# Patient Record
Sex: Female | Born: 1965
Health system: Southern US, Community
[De-identification: ages and names within clinical notes are randomized; demographics above are authoritative.]

## PROBLEM LIST (undated history)

## (undated) DIAGNOSIS — M2550 Pain in unspecified joint: Secondary | ICD-10-CM

## (undated) DIAGNOSIS — N83209 Unspecified ovarian cyst, unspecified side: Secondary | ICD-10-CM

## (undated) DIAGNOSIS — K5792 Diverticulitis of intestine, part unspecified, without perforation or abscess without bleeding: Secondary | ICD-10-CM

## (undated) DIAGNOSIS — I10 Essential (primary) hypertension: Secondary | ICD-10-CM

## (undated) DIAGNOSIS — M549 Dorsalgia, unspecified: Secondary | ICD-10-CM

## (undated) DIAGNOSIS — M51369 Other intervertebral disc degeneration, lumbar region without mention of lumbar back pain or lower extremity pain: Secondary | ICD-10-CM

## (undated) DIAGNOSIS — E559 Vitamin D deficiency, unspecified: Secondary | ICD-10-CM

## (undated) DIAGNOSIS — D571 Sickle-cell disease without crisis: Secondary | ICD-10-CM

## (undated) DIAGNOSIS — R519 Headache, unspecified: Secondary | ICD-10-CM

## (undated) DIAGNOSIS — R112 Nausea with vomiting, unspecified: Secondary | ICD-10-CM

## (undated) DIAGNOSIS — Z9889 Other specified postprocedural states: Secondary | ICD-10-CM

## (undated) DIAGNOSIS — M5136 Other intervertebral disc degeneration, lumbar region: Secondary | ICD-10-CM

## (undated) DIAGNOSIS — Z91018 Allergy to other foods: Secondary | ICD-10-CM

## (undated) DIAGNOSIS — M7989 Other specified soft tissue disorders: Secondary | ICD-10-CM

## (undated) HISTORY — PX: APPENDECTOMY: SHX54

## (undated) HISTORY — DX: Allergy to other foods: Z91.018

## (undated) HISTORY — DX: Vitamin D deficiency, unspecified: E55.9

## (undated) HISTORY — DX: Other intervertebral disc degeneration, lumbar region: M51.36

## (undated) HISTORY — DX: Pain in unspecified joint: M25.50

## (undated) HISTORY — DX: Other intervertebral disc degeneration, lumbar region without mention of lumbar back pain or lower extremity pain: M51.369

## (undated) HISTORY — DX: Headache, unspecified: R51.9

## (undated) HISTORY — PX: LAPAROSCOPIC OVARIAN CYSTECTOMY: SUR786

## (undated) HISTORY — PX: ABDOMINAL HYSTERECTOMY: SHX81

## (undated) HISTORY — PX: TUBAL LIGATION: SHX77

## (undated) HISTORY — DX: Other specified soft tissue disorders: M79.89

## (undated) HISTORY — DX: Dorsalgia, unspecified: M54.9

---
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
## 1993-06-25 HISTORY — PX: TUBAL LIGATION: SHX77

## 1998-04-07 ENCOUNTER — Other Ambulatory Visit: Admission: RE | Admit: 1998-04-07 | Discharge: 1998-04-07 | Payer: Self-pay | Admitting: Obstetrics and Gynecology

## 1999-08-17 ENCOUNTER — Ambulatory Visit (HOSPITAL_COMMUNITY): Admission: RE | Admit: 1999-08-17 | Discharge: 1999-08-17 | Payer: Self-pay | Admitting: Obstetrics and Gynecology

## 1999-08-17 ENCOUNTER — Encounter: Payer: Self-pay | Admitting: Obstetrics and Gynecology

## 2001-01-27 ENCOUNTER — Other Ambulatory Visit: Admission: RE | Admit: 2001-01-27 | Discharge: 2001-01-27 | Payer: Self-pay | Admitting: Obstetrics and Gynecology

## 2001-09-10 ENCOUNTER — Encounter: Payer: Self-pay | Admitting: Family Medicine

## 2001-09-10 ENCOUNTER — Ambulatory Visit (HOSPITAL_COMMUNITY): Admission: RE | Admit: 2001-09-10 | Discharge: 2001-09-10 | Payer: Self-pay | Admitting: Family Medicine

## 2001-09-22 ENCOUNTER — Ambulatory Visit (HOSPITAL_COMMUNITY): Admission: RE | Admit: 2001-09-22 | Discharge: 2001-09-22 | Payer: Self-pay | Admitting: Family Medicine

## 2001-09-22 ENCOUNTER — Encounter: Payer: Self-pay | Admitting: Family Medicine

## 2002-01-27 ENCOUNTER — Other Ambulatory Visit: Admission: RE | Admit: 2002-01-27 | Discharge: 2002-01-27 | Payer: Self-pay | Admitting: Obstetrics and Gynecology

## 2002-06-25 HISTORY — PX: LAPAROSCOPIC OVARIAN CYSTECTOMY: SUR786

## 2002-08-12 ENCOUNTER — Encounter: Admission: RE | Admit: 2002-08-12 | Discharge: 2002-08-12 | Payer: Self-pay | Admitting: Family Medicine

## 2002-08-12 ENCOUNTER — Encounter: Payer: Self-pay | Admitting: Family Medicine

## 2002-08-15 ENCOUNTER — Ambulatory Visit (HOSPITAL_COMMUNITY): Admission: RE | Admit: 2002-08-15 | Discharge: 2002-08-15 | Payer: Self-pay | Admitting: Obstetrics and Gynecology

## 2002-08-15 ENCOUNTER — Encounter (INDEPENDENT_AMBULATORY_CARE_PROVIDER_SITE_OTHER): Payer: Self-pay | Admitting: *Deleted

## 2003-03-24 ENCOUNTER — Other Ambulatory Visit: Admission: RE | Admit: 2003-03-24 | Discharge: 2003-03-24 | Payer: Self-pay | Admitting: Obstetrics and Gynecology

## 2004-04-19 ENCOUNTER — Other Ambulatory Visit: Admission: RE | Admit: 2004-04-19 | Discharge: 2004-04-19 | Payer: Self-pay | Admitting: Obstetrics and Gynecology

## 2004-05-03 ENCOUNTER — Ambulatory Visit (HOSPITAL_COMMUNITY): Admission: RE | Admit: 2004-05-03 | Discharge: 2004-05-03 | Payer: Self-pay | Admitting: Obstetrics and Gynecology

## 2005-05-30 ENCOUNTER — Other Ambulatory Visit: Admission: RE | Admit: 2005-05-30 | Discharge: 2005-05-30 | Payer: Self-pay | Admitting: Obstetrics and Gynecology

## 2005-06-27 ENCOUNTER — Ambulatory Visit (HOSPITAL_COMMUNITY): Admission: RE | Admit: 2005-06-27 | Discharge: 2005-06-27 | Payer: Self-pay | Admitting: Obstetrics and Gynecology

## 2006-04-23 ENCOUNTER — Emergency Department (HOSPITAL_COMMUNITY): Admission: EM | Admit: 2006-04-23 | Discharge: 2006-04-24 | Payer: Self-pay | Admitting: Emergency Medicine

## 2006-06-24 ENCOUNTER — Ambulatory Visit (HOSPITAL_COMMUNITY): Admission: RE | Admit: 2006-06-24 | Discharge: 2006-06-24 | Payer: Self-pay | Admitting: Family Medicine

## 2006-06-25 HISTORY — PX: ABDOMINAL HYSTERECTOMY: SHX81

## 2006-06-25 HISTORY — PX: FOOT SURGERY: SHX648

## 2006-06-28 ENCOUNTER — Ambulatory Visit (HOSPITAL_COMMUNITY): Admission: RE | Admit: 2006-06-28 | Discharge: 2006-06-28 | Payer: Self-pay | Admitting: Vascular Surgery

## 2006-07-02 ENCOUNTER — Ambulatory Visit (HOSPITAL_COMMUNITY): Admission: RE | Admit: 2006-07-02 | Discharge: 2006-07-02 | Payer: Self-pay | Admitting: Obstetrics and Gynecology

## 2007-01-09 ENCOUNTER — Encounter (INDEPENDENT_AMBULATORY_CARE_PROVIDER_SITE_OTHER): Payer: Self-pay | Admitting: Obstetrics and Gynecology

## 2007-01-09 ENCOUNTER — Ambulatory Visit (HOSPITAL_COMMUNITY): Admission: RE | Admit: 2007-01-09 | Discharge: 2007-01-10 | Payer: Self-pay | Admitting: Obstetrics and Gynecology

## 2007-07-09 ENCOUNTER — Ambulatory Visit (HOSPITAL_COMMUNITY): Admission: RE | Admit: 2007-07-09 | Discharge: 2007-07-09 | Payer: Self-pay | Admitting: Obstetrics and Gynecology

## 2007-07-16 ENCOUNTER — Encounter: Admission: RE | Admit: 2007-07-16 | Discharge: 2007-07-16 | Payer: Self-pay | Admitting: Obstetrics and Gynecology

## 2008-08-04 ENCOUNTER — Ambulatory Visit (HOSPITAL_COMMUNITY): Admission: RE | Admit: 2008-08-04 | Discharge: 2008-08-04 | Payer: Self-pay | Admitting: Obstetrics and Gynecology

## 2009-08-09 ENCOUNTER — Ambulatory Visit (HOSPITAL_COMMUNITY): Admission: RE | Admit: 2009-08-09 | Discharge: 2009-08-09 | Payer: Self-pay | Admitting: Obstetrics and Gynecology

## 2009-10-23 ENCOUNTER — Inpatient Hospital Stay (HOSPITAL_COMMUNITY): Admission: EM | Admit: 2009-10-23 | Discharge: 2009-10-26 | Payer: Self-pay

## 2009-10-23 ENCOUNTER — Encounter: Payer: Self-pay | Admitting: Emergency Medicine

## 2009-10-23 ENCOUNTER — Ambulatory Visit: Payer: Self-pay | Admitting: Diagnostic Radiology

## 2009-11-09 ENCOUNTER — Encounter: Admission: RE | Admit: 2009-11-09 | Discharge: 2009-11-09 | Payer: Self-pay | Admitting: Family Medicine

## 2010-08-08 ENCOUNTER — Other Ambulatory Visit (HOSPITAL_COMMUNITY): Payer: Self-pay | Admitting: Obstetrics and Gynecology

## 2010-08-08 DIAGNOSIS — Z1231 Encounter for screening mammogram for malignant neoplasm of breast: Secondary | ICD-10-CM

## 2010-08-22 ENCOUNTER — Encounter (HOSPITAL_COMMUNITY): Payer: Self-pay

## 2010-08-22 ENCOUNTER — Ambulatory Visit (HOSPITAL_COMMUNITY)
Admission: RE | Admit: 2010-08-22 | Discharge: 2010-08-22 | Disposition: A | Discharge status: Home / Self Care | Payer: BC Managed Care – PPO | Source: Ambulatory Visit | Attending: Obstetrics and Gynecology | Admitting: Obstetrics and Gynecology

## 2010-08-22 DIAGNOSIS — Z1231 Encounter for screening mammogram for malignant neoplasm of breast: Secondary | ICD-10-CM | POA: Insufficient documentation

## 2010-09-12 LAB — CBC
HCT: 37.3 % (ref 36.0–46.0)
HCT: 42.2 % (ref 36.0–46.0)
Hemoglobin: 12.7 g/dL (ref 12.0–15.0)
Hemoglobin: 13.9 g/dL (ref 12.0–15.0)
MCHC: 34.7 g/dL (ref 30.0–36.0)
MCV: 93.4 fL (ref 78.0–100.0)
MCV: 94 fL (ref 78.0–100.0)
MCV: 91.7 fL (ref 78.0–100.0)
Platelets: 222 10*3/uL (ref 150–400)
Platelets: 279 10*3/uL (ref 150–400)
RBC: 3.82 MIL/uL — ABNORMAL LOW (ref 3.87–5.11)
RBC: 3.97 MIL/uL (ref 3.87–5.11)
RDW: 12.2 % (ref 11.5–15.5)
RDW: 11.6 % (ref 11.5–15.5)
WBC: 10.4 10*3/uL (ref 4.0–10.5)
WBC: 7.3 10*3/uL (ref 4.0–10.5)

## 2010-09-12 LAB — COMPREHENSIVE METABOLIC PANEL
ALT: 12 U/L (ref 0–35)
AST: 13 U/L (ref 0–37)
Albumin: 3.1 g/dL — ABNORMAL LOW (ref 3.5–5.2)
CO2: 25 mEq/L (ref 19–32)
Chloride: 109 mEq/L (ref 96–112)
GFR calc Af Amer: >60 mL/min (ref >60)
GFR calc non Af Amer: >60 mL/min (ref >60)
Potassium: 3.3 mEq/L — ABNORMAL LOW (ref 3.5–5.1)
Sodium: 138 mEq/L (ref 135–145)
Total Bilirubin: 1.1 mg/dL (ref 0.3–1.2)

## 2010-09-12 LAB — BASIC METABOLIC PANEL
BUN: 11 mg/dL (ref 6–23)
CO2: 23 mEq/L (ref 19–32)
Calcium: 8.6 mg/dL (ref 8.4–10.5)
Chloride: 104 mEq/L (ref 96–112)
Creatinine, Ser: 0.97 mg/dL (ref 0.4–1.2)
GFR calc Af Amer: >60 mL/min (ref >60)
GFR calc non Af Amer: >60 mL/min (ref >60)
Glucose, Bld: 96 mg/dL (ref 70–99)
Glucose, Bld: 101 mg/dL — ABNORMAL HIGH (ref 70–99)
Potassium: 3.7 mEq/L (ref 3.5–5.1)
Sodium: 138 mEq/L (ref 135–145)
Sodium: 141 mEq/L (ref 135–145)

## 2010-09-12 LAB — CULTURE, BLOOD (ROUTINE X 2): Culture: NO GROWTH

## 2010-09-12 LAB — URINALYSIS, ROUTINE W REFLEX MICROSCOPIC
Glucose, UA: NEGATIVE mg/dL
Hgb urine dipstick: NEGATIVE
Protein, ur: NEGATIVE mg/dL
pH: 7 (ref 5.0–8.0)

## 2010-09-12 LAB — DIFFERENTIAL
Basophils Absolute: 0.3 10*3/uL — ABNORMAL HIGH (ref 0.0–0.1)
Eosinophils Relative: 0 % (ref 0–5)
Lymphs Abs: 1.6 10*3/uL (ref 0.7–4.0)
Monocytes Absolute: 0.9 10*3/uL (ref 0.1–1.0)
Neutro Abs: 14.5 10*3/uL — ABNORMAL HIGH (ref 1.7–7.7)

## 2010-11-10 NOTE — Discharge Summary (Signed)
NAME:  Deborah Chaney, STRAUSS NO.:  [id number]   MEDICAL RECORD NO.:  [id number]          PATIENT TYPE:  OIB   LOCATION:  9309                          [facility]   PHYSICIAN:  Janine Limbo, M.D.DATE OF BIRTH:  [birth year]   DATE OF ADMISSION:  01/09/2007  DATE OF DISCHARGE:  01/10/2007                               DISCHARGE SUMMARY   DISCHARGE DIAGNOSES:  1. Symptomatic uterine fibroids.  2. Menorrhagia.  3. Dysmenorrhea.  4. Pelvic adhesions.   OPERATION:  On the day of admission the patient underwent a total  vaginal hysterectomy tolerating procedure well.   HISTORY OF PRESENT ILLNESS:  The patient is a 45 year old female para 0-  1-0-1 who presents for hysterectomy because of symptomatic uterine  fibroids.  Please see the patient's dictated history and physical  examination for details.   PREOPERATIVE PHYSICAL EXAM:  VITAL SIGNS:  Weight is 195 pounds, height  is 5 feet 3-1/2 inches.  GENERAL:  Within normal limits.  PELVIC:  External genitalia is normal, vagina is normal.  Uterus is 10  weeks size and irregular.  Adnexa no masses and rectovaginal exam  confirms.   HOSPITAL COURSE:  On the day of admission the patient underwent  aforementioned procedure tolerating it well.  Postoperative course was  unremarkable with patient resuming bowel and bladder function by postop  day #1 and therefore deemed ready for discharge home.  The patient's  postoperative hemoglobin was 12.1 (preoperative hemoglobin 13.7).   DISCHARGE MEDICATIONS:  1. Vicodin 1-2 tablets every 4 hours as needed for pain.  2. Phenergan 25 mg every 6 hours as needed for nausea.   FOLLOW UP:  The patient is to call [facility] at [phone]  to schedule a 6 weeks postoperative visit.   DISCHARGE INSTRUCTIONS:  The patient was given a copy of [facility] postoperative instruction sheet.  She was further  advised to avoid driving for 1-2 weeks, heavy lifting for  4 weeks,  intercourse for 6 weeks, that she may walk up steps, that she may  shower.  She may increase her activities slowly.  The patient's diet was  without restriction.   FINAL PATHOLOGY:  Uterus and cervix, hysterectomy:  Cervix - mild  chronic inflammation, focal parakeratosis, there is no evidence of  malignancy; endometrium - secretory endometrium; there is no evidence of  malignancy; myometrium - leiomyomata, there is no evidence of  malignancy; serosa - few small adhesions, there is no evidence of  malignancy.      Deborah Chaney.      Janine Limbo, M.D.  Electronically Signed    EJP/MEDQ  D:  01/31/2007  T:  02/02/2007  Job:  [id number]

## 2010-11-10 NOTE — H&P (Signed)
NAME:  Deborah Chaney, WALLA NO.:  [id number]   MEDICAL RECORD NO.:  [id number]          PATIENT TYPE:  AMB   LOCATION:  SDC                           [facility]   PHYSICIAN:  Janine Limbo, M.D.DATE OF BIRTH:  July 19, 1965   DATE OF ADMISSION:  DATE OF DISCHARGE:                              HISTORY & PHYSICAL   DATE OF SURGERY:  January 09, 2007   HISTORY OF PRESENT ILLNESS:  Ms. Lewie Loron is a 45 year old female,  para 0-1-0-1, who presents for a vaginal hysterectomy.  The patient has  been followed at the [facility] division  of [facility].  The patient has a known history of  fibroids.  An ultrasound showed an 8.4 x 6.8-cm uterus with multiple  fibroids.  The largest fibroid measured 2.5 cm.  The patient's ovaries  appear normal.  Gonorrhea culture was negative.  Chlamydia culture was  negative.  An endometrial biopsy showed benign elements.  The patient's  Pap smear was within normal limits.  Pain medication and hormone therapy  have not relieved her discomfort.  She wants to proceed at this time  with definitive therapy.   OBSTETRICAL HISTORY:  The patient had a preterm cesarean section.   PAST MEDICAL HISTORY:  1. The patient had a bilateral tubal ligation in 1995.  2. She had an appendectomy in 1984.  3. She had her wisdom teeth removed in the early 80s.  4. In 2004, the patient had a diagnostic laparoscopy, and she was      found to have a hemorrhagic cyst of the ovary.  5. The patient had a dissecting left carotid artery in 2006, and she      was briefly treated with   Dictation ended at this point.      Janine Limbo, M.D.  Electronically Signed     AVS/MEDQ  D:  01/05/2007  T:  01/06/2007  Job:  [id number]

## 2010-11-10 NOTE — H&P (Signed)
NAME:  Deborah Chaney, Deborah Chaney                     ACCOUNT NO.:  [id number]   MEDICAL RECORD NO.:  [id number]                   PATIENT TYPE:  AMB   LOCATION:  SDC                                  [facility]   PHYSICIAN:  Janine Limbo, M.D.            DATE OF BIRTH:  [birth year]   DATE OF ADMISSION:  08/15/2002  DATE OF DISCHARGE:                                HISTORY & PHYSICAL   HISTORY OF PRESENT ILLNESS:  The patient is a 45 year old female, para 0-1-0-  1, who presents for diagnostic laparoscopy.  The patient has a three to four-  day history of right lower quadrant pain. She was evaluated by her primary  physician who obtained an ultrasound of the pelvis that showed a 5.9 x 5.0 x  4.6 cm complex mass in the right ovary.  The left ovary was within normal  limits.  The uterus was within normal limits except for a 1 cm area that was  consistent with a fibroid.  The patient has had increasing pain, and she  would like to proceed at this point with diagnostic laparoscopy.  The  patient was noted to have a normal white blood cell count as part of her  evaluation.  The patient has had a prior cesarean section as well as a prior  appendectomy.  She has had two prior laparoscopies because of ovarian cysts,  and she has had a prior laparoscopic tubal cautery.   PAST MEDICAL HISTORY:  The patient denies hypertension and diabetes.   MEDICATIONS:  Her only medication is Vicodin for pain.   SOCIAL HISTORY:  The patient denies cigarette use, alcohol use, and  recreational drug use.   DRUG ALLERGIES:  None known.   PAST OBSTETRICAL HISTORY:  The patient has had a cesarean section for a  preterm delivery.   REVIEW OF SYSTEMS:  Noncontributory.   FAMILY HISTORY:  Noncontributory.   PHYSICAL EXAMINATION:  VITAL SIGNS:  Weight is 186 pounds.  HEENT:  Within normal limits.  CHEST:  Clear.  HEART:  Regular rate and rhythm.  BREASTS:  Without masses.  ABDOMEN:  Nontender.  EXTREMITIES:  Within normal limits.  NEUROLOGIC:  Grossly normal.  PELVIC:  External genitalia is normal.  Vagina is normal.  Cervix is  nontender.  Uterus is normal size, shape, and consistency.  Adnexa:  There  is tenderness in the right adnexa, and there is a full mass present.  Rectovaginal exam confirms.   ASSESSMENT:  Right pelvic mass.    PLAN:  The patient will undergo a diagnostic laparoscopy.  She understands  the indications for her procedure, and she accepts the risk of, but not  limited to, anesthetic complications, bleeding, infection, and possible  damage to surrounding organs.  She understands that no guarantees can be  given concerning the total relief of her discomfort.  She understands that  it may be necessary to perform a oophorectomy.  Janine Limbo, M.D.    AVS/MEDQ  D:  08/14/2002  T:  08/15/2002  Job:  [id number]   cc:   Stacie Acres. White, M.D.  [address]  [place]  [place] [address]  Fax: [phone]

## 2010-11-10 NOTE — Op Note (Signed)
NAME:  Deborah Chaney, Deborah Chaney NO.:  [id number]   MEDICAL RECORD NO.:  [id number]          PATIENT TYPE:  AMB   LOCATION:  SDC                           [facility]   PHYSICIAN:  Janine Limbo, M.D.DATE OF BIRTH:  1965-09-24   DATE OF PROCEDURE:  01/09/2007  DATE OF DISCHARGE:                               OPERATIVE REPORT   PREOPERATIVE DIAGNOSIS:  1. Fibroid uterus.  2. Menorrhagia  3. Dysmenorrhea.   POSTOPERATIVE DIAGNOSIS:  1. Fibroid uterus.  2. Menorrhagia  3. Dysmenorrhea.   PROCEDURE:  Vaginal hysterectomy.   SURGEON:  Dr. Leonard Schwartz   FIRST ASSISTANT:  Dr. Dierdre Forth.   ANESTHETIC:  Was general.   DISPOSITION:  Deborah Chaney is a 45 year old female who presents  with the above-mentioned diagnosis.  She understands the indications for  her surgical procedure and she accepts the risks of, but not limited to,  anesthetic complications, bleeding, infection, and possible damage to  surrounding organs.   FINDINGS:  The patient was noted to have a multi fibroid uterus that was  approximately 10 weeks size.  The uterus weighed less than 200 grams.  The fallopian tubes and ovaries appeared normal.   PROCEDURE:  The patient was taken to the operating room where general  anesthetic was given.  The patient's abdomen, perineum, and vagina were  prepped with multiple layers of Hibiclens.  The patient was given a gram  of Ancef preoperatively.  Examination under anesthesia was performed.  A  Foley catheter was placed in the bladder.  The patient was sterilely  draped.  The cervix was injected with a diluted solution of Pitressin  and saline.  A circumferential incision was made around the cervix and  the vaginal mucosa was advanced anteriorly and posteriorly.  The  anterior cul-de-sac was sharply entered.  The posterior cul-de-sac was  sharply entered.  Alternating from right to left uterosacral ligaments,  paracervical tissues,  parametrial tissues, and uterine arteries were  clamped, cut, sutured, and tied securely.  The uterus was inverted  through the posterior colpotomy.  The remainder of the upper pedicles  were clamped and cut and the uterus was removed from the operative  field.  The upper pedicles were secured using a free tie and suture  ligature.  Hemostasis was achieved using figure-of-eight sutures.  The  sutures attached to the uterosacral ligaments were brought out through  the vaginal angles and tied securely.  A McCall culdoplasty suture was  placed in the posterior cul-de-sac incorporating the uterosacral  ligaments bilaterally and the posterior peritoneum.  A final check was  made for hemostasis and again hemostasis was confirmed.  The vaginal  cuff was then closed using figure-of-eight sutures incorporating the  anterior vaginal mucosa, the anterior peritoneum, posterior peritoneum,  and then the posterior vaginal mucosa.  The McCall culdoplasty suture  was tied securely and the apex of the vagina was noted to elevate into  the mid pelvis.  Sponge, needle, instrument counts were correct on two  occasions.  0 Vicryl suture material used throughout the procedure.  The  patient was noted to  drain clear yellow urine at end of her procedure.  The patient was given Toradol 30 mg intravenously and 30 mg  intramuscularly prior to the end of her procedure.  The patient's  anesthetic was reversed and the patient was taken to the recovery room  in stable condition.  She tolerated her procedure well.  The uterus was  sent to pathology for evaluation.      Janine Limbo, M.D.  Electronically Signed     AVS/MEDQ  D:  01/09/2007  T:  01/09/2007  Job:  [id number]

## 2010-11-10 NOTE — H&P (Signed)
NAME:  Deborah Chaney, Deborah Chaney NO.:  [id number]   MEDICAL RECORD NO.:  [id number]          PATIENT TYPE:  AMB   LOCATION:  SDC                           [facility]   PHYSICIAN:  Janine Limbo, M.D.DATE OF BIRTH:  12/22/1965   DATE OF ADMISSION:  01/09/2007  DATE OF DISCHARGE:                              HISTORY & PHYSICAL   HISTORY OF PRESENT ILLNESS:  Ms. Deborah Chaney is a 45 year old female,  para 0-1-0-1, who presents for a vaginal hysterectomy.  The patient has  been followed at the [facility].  The patient has a known history of fibroids,  menorrhagia, and dysmenorrhea.  An ultrasound showed a 8.4 x 6.8 cm  uterus with multiple fibroids.  The largest fibroid measured 2.5 cm in  size.  The fallopian tubes appeared normal.  Gonorrhea culture was  negative.  Chlamydia culture was negative.  An endometrial biopsy showed  benign elements.  Her Pap smear was within normal limits.  The patient  has had a cesarean delivery and a bilateral tubal ligation.  She had a  diagnostic laparoscopy in 2004 and she was found to have a hemorrhagic  cyst.  She had an appendectomy in 1984.  Pain medication and hormonal  therapy have not relieved her discomfort.  She is ready to proceed with  definitive therapy.   DRUG ALLERGIES:  MONISTAT causes swelling and pain.   OBSTETRICAL HISTORY:  The patient has had one preterm cesarean delivery.   PAST MEDICAL HISTORY:  The patient has a surgical history as mentioned  above.  She had her wisdom teeth removed in the early 1980s.  She had a  dissecting left carotid artery in 2007 for which she was briefly treated  with Plavix.  She is currently having no problems.   MEDICATIONS:  The patient takes multivitamins, low dose aspirin, and  Skelaxin for muscle spasms.   SOCIAL HISTORY:  The patient denies cigarette use, alcohol use, and  recreational drug use.   REVIEW OF SYSTEMS:   Please see history of present illness.   FAMILY HISTORY:  The patient's father has hypertension and diabetes.  Her paternal grandmother had colon cancer.   PHYSICAL EXAM:  VITAL SIGNS:  Weight is 195 pounds.  Height is 5 feet 3-  1/2 inch.  HEENT:  Within normal limits.  CHEST:  Clear.  HEART: Regular rate and rhythm.  BREASTS:  Without masses.  ABDOMEN:  Nontender.  EXTREMITIES:  Grossly normal.  NEUROLOGY:  Grossly normal.  PELVIC:  External genitalia is normal.  Vagina is normal.  Uterus is 10-  week size and irregular.  Adnexa no masses and rectovaginal exam  confirms.   ASSESSMENT:  1. Fibroid uterus.  2. Menorrhagia.  3. Dysmenorrhea.   PLAN:  The patient will undergo a vaginal hysterectomy.  The patient  understands the indications for her surgical procedure and she accepts  the alternatives to therapy which included observation only, myomectomy,  uterine artery embolization, endometrial ablation, fibroid ablation, and  medical therapy.  She accepts the risks, but not limited to, anesthetic  complications, bleeding, infection, and possible damage to the  surrounding organs.      Janine Limbo, M.D.  Electronically Signed     AVS/MEDQ  D:  01/05/2007  T:  01/06/2007  Job:  [id number]

## 2010-11-10 NOTE — Op Note (Signed)
NAME:  Deborah Chaney, Deborah Chaney                     ACCOUNT NO.:  [id number]   MEDICAL RECORD NO.:  [id number]                   PATIENT TYPE:  AMB   LOCATION:  SDC                                  [facility]   PHYSICIAN:  Janine Limbo, M.D.            DATE OF BIRTH:  [birth year]   DATE OF PROCEDURE:  08/15/2002  DATE OF DISCHARGE:                                 OPERATIVE REPORT   PREOPERATIVE DIAGNOSES:  1. Pelvic pain.  2. Right ovarian cyst.   POSTOPERATIVE DIAGNOSES:  1. Pelvic pain.  2. Right ovarian hemorrhagic cyst.  3. Fibroid uterus.   PROCEDURE:  1. Diagnostic laparoscopy.  2. Laparoscopic right ovarian cystectomy.   SURGEON:  Janine Limbo, M.D.   ANESTHESIA:  General.   DISPOSITION:  The patient is a 45 year old female, para 0-1-0-1, who  presents with pelvic pain.  An ultrasound showed a complex right ovarian  cyst.  The patient had her pain worsen over the past four days, and she  presents at this time for diagnostic laparoscopy.   She understands the indications for her procedure.  She accepts the risks  of, but not limited to, anesthetic complications bleeding, infections, and  possible damage to the surrounding organs.   FINDINGS:  An 8 cm hemorrhagic right ovarian cyst was present.  There were  two 2 cm fibroids present on the posterior fundus of the uterus.  The  fallopian tubes were normal, except for defects secondary to prior tubal  ligation.  The left ovary, bowel, liver, and upper abdomen were normal.  The  appendix was surgically absent.   A 50 cc hemoperitoneum was noted upon entering the abdomen.   DESCRIPTION OF PROCEDURE:  The patient was taken to the operating room,  where a general anesthetic was given.  The patient's abdomen, perineum and  vagina were prepped with multiple layers of Betadine.  A Foley catheter was  placed in the bladder.   Examination under anesthesia was performed.  A Hulka tenaculum was placed  inside  the uterus.  The patient was sterilely draped.  The subumbilical area  was injected with 6 cc of 0.5% Marcaine.  An incision was made and carried  sharply through the subcutaneous tissue, the fascia, and the anterior  peritoneum.  The Hasson cannula was sutured into place.  A pneumoperitoneum  was then obtained.  The pelvic organs were visualized, with findings as  mentioned above.  Two suprapubic incisions were made after injecting the  skin with a total of 4 cc of 0.5% Marcaine.  Two 5 mm trocars were placed in  the lower abdomen under direct visualization.  Pictures were taken of the  patient's pelvic anatomy.  The pneumoperitoneum was irrigated and then  aspirated from the pelvis.  The large hemorrhagic cyst on the right ovary  was then evacuated.  Parts of the cyst wall were peeled away and sharply  excised.  Hemostasis was achieved  using the bipolar cautery.  The pelvis was  then thoroughly irrigated.  Hemostasis was noted to be adequate throughout.   The instruments were removed under direct visualization.  The bowel was  inspected and there was no evidence of trocar damage.  The Hasson cannula  was then removed.  The fascia at the subumbilical incision was closed using  figure-of-eight sutures of 0 Vicryl.  The skin was reapproximated using a  subcuticular suture of 4-0 Vicryl.  The suprapubic incisions were closed  using 4-0 Vicryl.  Sponge, needle and instrument counts were correct.   ESTIMATED BLOOD LOSS:  60 cc (that included the 50 cc of hemoperitoneum).   DISPOSITION:  The patient was awakened from her anesthetic and taken to the  recovery room in stable condition.   FOLLOW-UP INSTRUCTIONS:  The patient was given a prescription for Darvocet N  100 and she will take one tablet q.4-6h. p.r.n. pain.  She was given a copy  of the postoperative instruction sheet, as prepared by the [facility]  of [facility] for patient's who have undergone diagnostic laparoscopy.   She  will return to see Dr. Stefano Gaul in two to three weeks for follow-up  examination.  She will call for questions or concerns.                                               Janine Limbo, M.D.    AVS/MEDQ  D:  08/15/2002  T:  08/15/2002  Job:  [id number]   cc:   Stacie Acres. White, M.D.  [address]  [place]  [place] [address]  Fax: [phone]

## 2011-04-09 LAB — CBC
HCT: 36.3
HCT: 41
Hemoglobin: 13.7
MCV: 92.7
Platelets: 236
Platelets: 269
RDW: 12.1
WBC: 21.5 — ABNORMAL HIGH
WBC: 8.8

## 2011-04-09 LAB — URINALYSIS, ROUTINE W REFLEX MICROSCOPIC
Glucose, UA: NEGATIVE
Ketones, ur: NEGATIVE
Protein, ur: NEGATIVE
Urobilinogen, UA: 0.2

## 2011-04-09 LAB — PREGNANCY, URINE: Preg Test, Ur: NEGATIVE

## 2011-04-09 LAB — COMPREHENSIVE METABOLIC PANEL
Albumin: 3.6
Alkaline Phosphatase: 56
BUN: 9
CO2: 25
Chloride: 105
Creatinine, Ser: 0.87
GFR calc non Af Amer: >60
Glucose, Bld: 96
Potassium: 3.9
Total Bilirubin: 0.8

## 2011-06-15 ENCOUNTER — Other Ambulatory Visit: Payer: Self-pay | Admitting: Family Medicine

## 2011-06-15 DIAGNOSIS — R51 Headache: Secondary | ICD-10-CM

## 2011-06-15 DIAGNOSIS — R479 Unspecified speech disturbances: Secondary | ICD-10-CM

## 2011-06-21 ENCOUNTER — Ambulatory Visit
Admission: RE | Admit: 2011-06-21 | Discharge: 2011-06-21 | Disposition: A | Discharge status: Home / Self Care | Payer: BC Managed Care – PPO | Source: Ambulatory Visit | Attending: Family Medicine | Admitting: Family Medicine

## 2011-06-21 DIAGNOSIS — R479 Unspecified speech disturbances: Secondary | ICD-10-CM

## 2011-06-21 DIAGNOSIS — R51 Headache: Secondary | ICD-10-CM

## 2011-06-21 MED ORDER — GADOBENATE DIMEGLUMINE 529 MG/ML IV SOLN
18.0000 mL | Freq: Once | INTRAVENOUS | Status: AC | PRN
  Administered 2011-06-21: 18 mL via INTRAVENOUS

## 2011-08-17 ENCOUNTER — Other Ambulatory Visit: Payer: Self-pay | Admitting: Obstetrics and Gynecology

## 2011-08-17 DIAGNOSIS — Z1231 Encounter for screening mammogram for malignant neoplasm of breast: Secondary | ICD-10-CM

## 2011-09-12 ENCOUNTER — Ambulatory Visit (HOSPITAL_COMMUNITY)
Admission: RE | Admit: 2011-09-12 | Discharge: 2011-09-12 | Disposition: A | Discharge status: Home / Self Care | Payer: BC Managed Care – PPO | Source: Ambulatory Visit | Attending: Obstetrics and Gynecology | Admitting: Obstetrics and Gynecology

## 2011-09-12 DIAGNOSIS — Z1231 Encounter for screening mammogram for malignant neoplasm of breast: Secondary | ICD-10-CM | POA: Insufficient documentation

## 2012-08-08 ENCOUNTER — Other Ambulatory Visit: Payer: Self-pay | Admitting: Obstetrics and Gynecology

## 2012-08-08 DIAGNOSIS — Z1231 Encounter for screening mammogram for malignant neoplasm of breast: Secondary | ICD-10-CM

## 2012-08-27 ENCOUNTER — Emergency Department (HOSPITAL_BASED_OUTPATIENT_CLINIC_OR_DEPARTMENT_OTHER)
Admission: EM | Admit: 2012-08-27 | Discharge: 2012-08-27 | Disposition: A | Discharge status: Home / Self Care | Payer: BC Managed Care – PPO | Attending: Emergency Medicine | Admitting: Emergency Medicine

## 2012-08-27 ENCOUNTER — Encounter (HOSPITAL_BASED_OUTPATIENT_CLINIC_OR_DEPARTMENT_OTHER): Payer: Self-pay | Admitting: Emergency Medicine

## 2012-08-27 ENCOUNTER — Emergency Department (HOSPITAL_BASED_OUTPATIENT_CLINIC_OR_DEPARTMENT_OTHER): Payer: BC Managed Care – PPO

## 2012-08-27 DIAGNOSIS — R21 Rash and other nonspecific skin eruption: Secondary | ICD-10-CM | POA: Insufficient documentation

## 2012-08-27 DIAGNOSIS — Z8742 Personal history of other diseases of the female genital tract: Secondary | ICD-10-CM | POA: Insufficient documentation

## 2012-08-27 DIAGNOSIS — K5732 Diverticulitis of large intestine without perforation or abscess without bleeding: Secondary | ICD-10-CM

## 2012-08-27 DIAGNOSIS — R112 Nausea with vomiting, unspecified: Secondary | ICD-10-CM | POA: Insufficient documentation

## 2012-08-27 HISTORY — DX: Diverticulitis of intestine, part unspecified, without perforation or abscess without bleeding: K57.92

## 2012-08-27 HISTORY — DX: Unspecified ovarian cyst, unspecified side: N83.209

## 2012-08-27 LAB — COMPREHENSIVE METABOLIC PANEL
ALT: 14 U/L (ref 0–35)
AST: 16 U/L (ref 0–37)
Albumin: 3.7 g/dL (ref 3.5–5.2)
Alkaline Phosphatase: 64 U/L (ref 39–117)
BUN: 11 mg/dL (ref 6–23)
CO2: 27 mEq/L (ref 19–32)
Calcium: 9.3 mg/dL (ref 8.4–10.5)
Chloride: 102 mEq/L (ref 96–112)
Creatinine, Ser: 1 mg/dL (ref 0.50–1.10)
GFR calc Af Amer: 76 mL/min — ABNORMAL LOW (ref >90)
GFR calc non Af Amer: 66 mL/min — ABNORMAL LOW (ref >90)
Glucose, Bld: 92 mg/dL (ref 70–99)
Potassium: 3.8 mEq/L (ref 3.5–5.1)
Sodium: 136 mEq/L (ref 135–145)
Total Bilirubin: 0.4 mg/dL (ref 0.3–1.2)
Total Protein: 7.3 g/dL (ref 6.0–8.3)

## 2012-08-27 LAB — CBC WITH DIFFERENTIAL/PLATELET
Basophils Absolute: 0 10*3/uL (ref 0.0–0.1)
Basophils Relative: 0 % (ref 0–1)
Eosinophils Absolute: 0.3 10*3/uL (ref 0.0–0.7)
Eosinophils Relative: 3 % (ref 0–5)
HCT: 38.9 % (ref 36.0–46.0)
Hemoglobin: 13.5 g/dL (ref 12.0–15.0)
Lymphocytes Relative: 12 % (ref 12–46)
Lymphs Abs: 1.4 10*3/uL (ref 0.7–4.0)
MCH: 30.8 pg (ref 26.0–34.0)
MCHC: 34.7 g/dL (ref 30.0–36.0)
MCV: 88.8 fL (ref 78.0–100.0)
Monocytes Absolute: 0.7 10*3/uL (ref 0.1–1.0)
Monocytes Relative: 6 % (ref 3–12)
Neutro Abs: 9.8 10*3/uL — ABNORMAL HIGH (ref 1.7–7.7)
Neutrophils Relative %: 80 % — ABNORMAL HIGH (ref 43–77)
Platelets: 276 10*3/uL (ref 150–400)
RBC: 4.38 MIL/uL (ref 3.87–5.11)
RDW: 11.7 % (ref 11.5–15.5)
WBC: 12.2 10*3/uL — ABNORMAL HIGH (ref 4.0–10.5)

## 2012-08-27 LAB — URINALYSIS, ROUTINE W REFLEX MICROSCOPIC
Bilirubin Urine: NEGATIVE
Ketones, ur: NEGATIVE mg/dL
Leukocytes, UA: NEGATIVE
Nitrite: NEGATIVE
Specific Gravity, Urine: 1.004 — ABNORMAL LOW (ref 1.005–1.030)
Urobilinogen, UA: 0.2 mg/dL (ref 0.0–1.0)

## 2012-08-27 MED ORDER — HYDROCODONE-ACETAMINOPHEN 5-325 MG PO TABS: 2.0000 | ORAL_TABLET | ORAL | Status: DC | PRN

## 2012-08-27 MED ORDER — IOHEXOL 300 MG/ML  SOLN
50.0000 mL | Freq: Once | INTRAMUSCULAR | Status: AC | PRN
  Administered 2012-08-27: 50 mL via ORAL

## 2012-08-27 MED ORDER — METRONIDAZOLE 500 MG PO TABS: 500.0000 mg | ORAL_TABLET | Freq: Two times a day (BID) | ORAL | Status: DC

## 2012-08-27 MED ORDER — DIPHENHYDRAMINE HCL 50 MG/ML IJ SOLN
12.5000 mg | Freq: Once | INTRAMUSCULAR | Status: AC
  Administered 2012-08-27: 12.5 mg via INTRAVENOUS
  Filled 2012-08-27: qty 1

## 2012-08-27 MED ORDER — IOHEXOL 300 MG/ML  SOLN
100.0000 mL | Freq: Once | INTRAMUSCULAR | Status: AC | PRN
  Administered 2012-08-27: 100 mL via INTRAVENOUS

## 2012-08-27 MED ORDER — ONDANSETRON HCL 4 MG PO TABS: 4.0000 mg | ORAL_TABLET | Freq: Four times a day (QID) | ORAL | Status: DC

## 2012-08-27 MED ORDER — CIPROFLOXACIN HCL 500 MG PO TABS: 500.0000 mg | ORAL_TABLET | Freq: Two times a day (BID) | ORAL | Status: DC

## 2012-08-27 NOTE — ED Provider Notes (Signed)
History     CSN: [id number]  Arrival date & time 08/27/12  1909   First MD Initiated Contact with Patient 08/27/12 2052      Chief Complaint  Patient presents with  . Flank Pain    (Consider location/radiation/quality/duration/timing/severity/associated sxs/prior treatment) Patient is a 47 y.o. female presenting with flank pain. The history is provided by the patient. No language interpreter was used.  Flank Pain This is a recurrent problem. The current episode started today. The problem occurs constantly. The problem has been waxing and waning. Associated symptoms include abdominal pain, nausea, a rash and vomiting. Pertinent negatives include no change in bowel habit, chest pain, coughing, diaphoresis, fatigue, fever or urinary symptoms. The symptoms are aggravated by eating. She has tried rest for the symptoms. The treatment provided mild relief.    Past Medical History  Diagnosis Date  . Diverticulitis   . Ovarian cyst     Past Surgical History  Procedure Laterality Date  . Appendectomy    . Tubal ligation    . Laparoscopic ovarian cystectomy    . Abdominal hysterectomy      No family history on file.  History  Substance Use Topics  . Smoking status: Never Smoker   . Smokeless tobacco: Not on file  . Alcohol Use: No    OB History   Grav Para Term Preterm Abortions TAB SAB Ect Mult Living                  Review of Systems  Constitutional: Negative for fever, diaphoresis and fatigue.  Respiratory: Negative for cough, shortness of breath and wheezing.   Cardiovascular: Negative for chest pain.  Gastrointestinal: Positive for nausea, vomiting and abdominal pain. Negative for diarrhea, blood in stool, abdominal distention and change in bowel habit.       Pain is similar to last episode of diverticulitis but "not as severe."  Genitourinary: Positive for flank pain. Negative for dysuria.  Skin: Positive for rash.       Took some Benadryl for rash at 6pm tonight.   Denies known allergies.  No new foods, soaps, lotions, or clothes.  Allergic/Immunologic: Negative.   Psychiatric/Behavioral: Negative.   All other systems reviewed and are negative.    Allergies  Penicillins  Home Medications   Current Outpatient Rx  Name  Route  Sig  Dispense  Refill  . calcium carbonate (OS-CAL - DOSED IN MG OF ELEMENTAL CALCIUM) 1250 MG tablet   Oral   Take 1 tablet by mouth daily.         . diphenhydrAMINE (BENADRYL) 25 MG tablet   Oral   Take 25 mg by mouth every 6 (six) hours as needed for itching.         . Multiple Vitamin (MULTIVITAMIN) tablet   Oral   Take 1 tablet by mouth daily.           BP 149/88  Pulse 107  Temp(Src) 97.5 F (36.4 C) (Oral)  Resp 18  Ht 5\' 4"  (1.626 m)  Wt 200 lb (90.719 kg)  BMI 34.31 kg/m2  SpO2 100%  Physical Exam  Nursing note and vitals reviewed. Constitutional: She is oriented to person, place, and time. She appears well-developed and well-nourished. No distress.  HENT:  Head: Normocephalic and atraumatic.  Eyes: EOM are normal. Pupils are equal, round, and reactive to light.  Cardiovascular: Normal rate, regular rhythm and normal heart sounds.   Pulmonary/Chest: Effort normal and breath sounds normal. No respiratory distress. She  has no wheezes.  Abdominal: Soft. Bowel sounds are normal. She exhibits no distension and no mass. There is tenderness. There is no rebound and no guarding.  Mild tenderness in LUQ and LLQ  Neurological: She is alert and oriented to person, place, and time.  Skin: Skin is warm and dry. Rash noted. She is not diaphoretic. There is erythema.  Linear erythemic, raised rash on back, stomach, arms, and thighs.     ED Course  Procedures (including critical care time)  Labs Reviewed  URINALYSIS, ROUTINE W REFLEX MICROSCOPIC - Abnormal; Notable for the following:    Specific Gravity, Urine 1.004 (*)    All other components within normal limits   No results found.   1.  Diverticulitis large intestine w/o perforation or abscess w/o bleeding       MDM  Pt with hx of diverticulitis was seen today for mild acute episode of diverticulitis.  Was prescribed antibiotics, antiemetics, and pain medicine.  Discharged in stable condition, pain mildly improved. Informed to return if pain increases, fever, or experiences blood diarrhea. Will follow up with Dr. Wynelle Link on Friday.          Junius Finner, PA-C 08/27/12 2258

## 2012-08-27 NOTE — ED Notes (Signed)
Pt returned from CT °

## 2012-08-27 NOTE — Discharge Instructions (Signed)
Diverticulitis °A diverticulum is a small pouch or sac on the colon. Diverticulosis is the presence of these diverticula on the colon. Diverticulitis is the irritation (inflammation) or infection of diverticula. °CAUSES  °The colon and its diverticula contain bacteria. If food particles block the tiny opening to a diverticulum, the bacteria inside can grow and cause an increase in pressure. This leads to infection and inflammation and is called diverticulitis. °SYMPTOMS  °· Abdominal pain and tenderness. Usually, the pain is located on the left side of your abdomen. However, it could be located elsewhere. °· Fever. °· Bloating. °· Feeling sick to your stomach (nausea). °· Throwing up (vomiting). °· Abnormal stools. °DIAGNOSIS  °Your caregiver will take a history and perform a physical exam. Since many things can cause abdominal pain, other tests may be necessary. Tests may include: °· Blood tests. °· Urine tests. °· X-ray of the abdomen. °· CT scan of the abdomen. °Sometimes, surgery is needed to determine if diverticulitis or other conditions are causing your symptoms. °TREATMENT  °Most of the time, you can be treated without surgery. Treatment includes: °· Resting the bowels by only having liquids for a few days. As you improve, you will need to eat a low-fiber diet. °· Intravenous (IV) fluids if you are losing body fluids (dehydrated). °· Antibiotic medicines that treat infections may be given. °· Pain and nausea medicine, if needed. °· Surgery if the inflamed diverticulum has burst. °HOME CARE INSTRUCTIONS  °· Try a clear liquid diet (broth, tea, or water for as long as directed by your caregiver). You may then gradually begin a low-fiber diet as tolerated. A low-fiber diet is a diet with less than 10 grams of fiber. Choose the foods below to reduce fiber in the diet: °· White breads, cereals, rice, and pasta. °· Cooked fruits and vegetables or soft fresh fruits and vegetables without the skin. °· Ground or  well-cooked tender beef, ham, veal, lamb, pork, or poultry. °· Eggs and seafood. °· After your diverticulitis symptoms have improved, your caregiver may put you on a high-fiber diet. A high-fiber diet includes 14 grams of fiber for every 1000 calories consumed. For a standard 2000 calorie diet, you would need 28 grams of fiber. Follow these diet guidelines to help you increase the fiber in your diet. It is important to slowly increase the amount fiber in your diet to avoid gas, constipation, and bloating. °· Choose whole-grain breads, cereals, pasta, and brown rice. °· Choose fresh fruits and vegetables with the skin on. Do not overcook vegetables because the more vegetables are cooked, the more fiber is lost. °· Choose more nuts, seeds, legumes, dried peas, beans, and lentils. °· Look for food products that have greater than 3 grams of fiber per serving on the Nutrition Facts label. °· Take all medicine as directed by your caregiver. °· If your caregiver has given you a follow-up appointment, it is very important that you go. Not going could result in lasting (chronic) or permanent injury, pain, and disability. If there is any problem keeping the appointment, call to reschedule. °SEEK MEDICAL CARE IF:  °· Your pain does not improve. °· You have a hard time advancing your diet beyond clear liquids. °· Your bowel movements do not return to normal. °SEEK IMMEDIATE MEDICAL CARE IF:  °· Your pain becomes worse. °· You have an oral temperature above 102° F (38.9° C), not controlled by medicine. °· You have repeated vomiting. °· You have bloody or black, tarry stools. °· Symptoms   that brought you to your caregiver become worse or are not getting better. °MAKE SURE YOU:  °· Understand these instructions. °· Will watch your condition. °· Will get help right away if you are not doing well or get worse. °Document Released: 03/21/2005 Document Revised: 09/03/2011 Document Reviewed: 07/17/2010 °[employer]® Patient Information  ©2013 [employer]. ° °

## 2012-08-27 NOTE — ED Notes (Signed)
Pt c/o left flank and left sided abd pain onset earlier today.

## 2012-08-27 NOTE — ED Notes (Signed)
Pt states she has previously had diverticulitis and this feels the same. Pt also has a fine red rash all over trunk and arms, which she took benadryl for earlier.

## 2012-08-29 NOTE — ED Provider Notes (Signed)
Medical screening examination/treatment/procedure(s) were performed by non-physician practitioner and as supervising physician I was immediately available for consultation/collaboration.   Shelda Jakes, MD 08/29/12 [phone]

## 2012-09-16 ENCOUNTER — Ambulatory Visit (HOSPITAL_COMMUNITY)
Admission: RE | Admit: 2012-09-16 | Discharge: 2012-09-16 | Disposition: A | Discharge status: Home / Self Care | Payer: BC Managed Care – PPO | Source: Ambulatory Visit | Attending: Obstetrics and Gynecology | Admitting: Obstetrics and Gynecology

## 2012-09-16 DIAGNOSIS — Z1231 Encounter for screening mammogram for malignant neoplasm of breast: Secondary | ICD-10-CM | POA: Insufficient documentation

## 2012-09-24 ENCOUNTER — Other Ambulatory Visit: Payer: Self-pay | Admitting: Obstetrics and Gynecology

## 2012-09-24 DIAGNOSIS — R928 Other abnormal and inconclusive findings on diagnostic imaging of breast: Secondary | ICD-10-CM

## 2012-10-01 ENCOUNTER — Ambulatory Visit
Admission: RE | Admit: 2012-10-01 | Discharge: 2012-10-01 | Disposition: A | Discharge status: Home / Self Care | Payer: BC Managed Care – PPO | Source: Ambulatory Visit | Attending: Obstetrics and Gynecology | Admitting: Obstetrics and Gynecology

## 2012-10-01 DIAGNOSIS — R928 Other abnormal and inconclusive findings on diagnostic imaging of breast: Secondary | ICD-10-CM

## 2013-02-03 ENCOUNTER — Other Ambulatory Visit (HOSPITAL_COMMUNITY)
Admission: RE | Admit: 2013-02-03 | Discharge: 2013-02-03 | Disposition: A | Discharge status: Home / Self Care | Payer: BC Managed Care – PPO | Source: Ambulatory Visit | Attending: Family Medicine | Admitting: Family Medicine

## 2013-02-03 ENCOUNTER — Other Ambulatory Visit: Payer: Self-pay | Admitting: Family Medicine

## 2013-02-03 DIAGNOSIS — Z01419 Encounter for gynecological examination (general) (routine) without abnormal findings: Secondary | ICD-10-CM | POA: Insufficient documentation

## 2013-06-12 ENCOUNTER — Emergency Department (HOSPITAL_BASED_OUTPATIENT_CLINIC_OR_DEPARTMENT_OTHER)
Admission: EM | Admit: 2013-06-12 | Discharge: 2013-06-12 | Disposition: A | Discharge status: Home / Self Care | Payer: BC Managed Care – PPO | Attending: Emergency Medicine | Admitting: Emergency Medicine

## 2013-06-12 ENCOUNTER — Encounter (HOSPITAL_BASED_OUTPATIENT_CLINIC_OR_DEPARTMENT_OTHER): Payer: Self-pay | Admitting: Emergency Medicine

## 2013-06-12 ENCOUNTER — Emergency Department (HOSPITAL_BASED_OUTPATIENT_CLINIC_OR_DEPARTMENT_OTHER): Payer: BC Managed Care – PPO

## 2013-06-12 DIAGNOSIS — Z880 Allergy status to penicillin: Secondary | ICD-10-CM | POA: Insufficient documentation

## 2013-06-12 DIAGNOSIS — Z79899 Other long term (current) drug therapy: Secondary | ICD-10-CM | POA: Insufficient documentation

## 2013-06-12 DIAGNOSIS — Z9089 Acquired absence of other organs: Secondary | ICD-10-CM | POA: Insufficient documentation

## 2013-06-12 DIAGNOSIS — Z792 Long term (current) use of antibiotics: Secondary | ICD-10-CM | POA: Insufficient documentation

## 2013-06-12 DIAGNOSIS — Z8719 Personal history of other diseases of the digestive system: Secondary | ICD-10-CM | POA: Insufficient documentation

## 2013-06-12 DIAGNOSIS — R51 Headache: Secondary | ICD-10-CM | POA: Insufficient documentation

## 2013-06-12 DIAGNOSIS — R519 Headache, unspecified: Secondary | ICD-10-CM

## 2013-06-12 DIAGNOSIS — Z90710 Acquired absence of both cervix and uterus: Secondary | ICD-10-CM | POA: Insufficient documentation

## 2013-06-12 DIAGNOSIS — R1110 Vomiting, unspecified: Secondary | ICD-10-CM

## 2013-06-12 DIAGNOSIS — R112 Nausea with vomiting, unspecified: Secondary | ICD-10-CM | POA: Insufficient documentation

## 2013-06-12 DIAGNOSIS — Z9851 Tubal ligation status: Secondary | ICD-10-CM | POA: Insufficient documentation

## 2013-06-12 DIAGNOSIS — Z8742 Personal history of other diseases of the female genital tract: Secondary | ICD-10-CM | POA: Insufficient documentation

## 2013-06-12 LAB — CBC WITH DIFFERENTIAL/PLATELET
Basophils Relative: 0 % (ref 0–1)
Blasts: 0 %
Eosinophils Absolute: 0.2 10*3/uL (ref 0.0–0.7)
Eosinophils Relative: 2 % (ref 0–5)
Hemoglobin: 12.2 g/dL (ref 12.0–15.0)
Lymphocytes Relative: 31 % (ref 12–46)
Monocytes Relative: 4 % (ref 3–12)
Myelocytes: 0 %
Neutro Abs: 5.5 10*3/uL (ref 1.7–7.7)
Neutrophils Relative %: 63 % (ref 43–77)
Platelets: 293 10*3/uL (ref 150–400)
RBC: 4.06 MIL/uL (ref 3.87–5.11)
WBC: 8.7 10*3/uL (ref 4.0–10.5)
nRBC: 0 /100 WBC

## 2013-06-12 LAB — CSF CELL COUNT WITH DIFFERENTIAL
Eosinophils, CSF: NONE SEEN % (ref 0–1)
RBC Count, CSF: 1 /mm3 — ABNORMAL HIGH
RBC Count, CSF: 160 /mm3 — ABNORMAL HIGH
Tube #: 1
Tube #: 4
WBC, CSF: 1 /mm3 (ref 0–5)

## 2013-06-12 LAB — BASIC METABOLIC PANEL
BUN: 8 mg/dL (ref 6–23)
CO2: 25 mEq/L (ref 19–32)
Creatinine, Ser: 1 mg/dL (ref 0.50–1.10)
GFR calc non Af Amer: 66 mL/min — ABNORMAL LOW (ref >90)
Glucose, Bld: 112 mg/dL — ABNORMAL HIGH (ref 70–99)
Potassium: 3.6 mEq/L (ref 3.5–5.1)
Sodium: 138 mEq/L (ref 135–145)

## 2013-06-12 LAB — PROTEIN, CSF: Total  Protein, CSF: 24 mg/dL (ref 15–45)

## 2013-06-12 LAB — GLUCOSE, CSF: Glucose, CSF: 68 mg/dL (ref 43–76)

## 2013-06-12 LAB — PROTIME-INR
INR: 0.99 (ref 0.00–1.49)
Prothrombin Time: 12.9 seconds (ref 11.6–15.2)

## 2013-06-12 MED ORDER — DIPHENHYDRAMINE HCL 50 MG/ML IJ SOLN
25.0000 mg | Freq: Once | INTRAMUSCULAR | Status: AC
  Administered 2013-06-12: 25 mg via INTRAVENOUS
  Filled 2013-06-12: qty 1

## 2013-06-12 MED ORDER — KETOROLAC TROMETHAMINE 30 MG/ML IJ SOLN
30.0000 mg | Freq: Once | INTRAMUSCULAR | Status: AC
  Administered 2013-06-12: 30 mg via INTRAVENOUS
  Filled 2013-06-12: qty 1

## 2013-06-12 MED ORDER — SODIUM CHLORIDE 0.9 % IV BOLUS (SEPSIS)
1000.0000 mL | Freq: Once | INTRAVENOUS | Status: AC
  Administered 2013-06-12: 1000 mL via INTRAVENOUS

## 2013-06-12 MED ORDER — PROMETHAZINE HCL 25 MG/ML IJ SOLN
25.0000 mg | Freq: Once | INTRAMUSCULAR | Status: AC
  Administered 2013-06-12: 25 mg via INTRAVENOUS
  Filled 2013-06-12: qty 1

## 2013-06-12 MED ORDER — DEXAMETHASONE SODIUM PHOSPHATE 10 MG/ML IJ SOLN
10.0000 mg | Freq: Once | INTRAMUSCULAR | Status: AC
  Administered 2013-06-12: 10 mg via INTRAVENOUS
  Filled 2013-06-12: qty 1

## 2013-06-12 MED ORDER — PROMETHAZINE HCL 25 MG PO TABS: 25.0000 mg | ORAL_TABLET | Freq: Four times a day (QID) | ORAL | Status: DC | PRN

## 2013-06-12 NOTE — ED Provider Notes (Signed)
CSN: [id number]     Arrival date & time 06/12/13  4098 History   First MD Initiated Contact with Patient 06/12/13 0830     Chief Complaint  Patient presents with  . Headache  . Nausea  . Emesis   (Consider location/radiation/quality/duration/timing/severity/associated sxs/prior Treatment) HPI Comments: Patient presents to the for evaluation of headache. This represents a headache one week ago that resolved with over-the-counter medicines. She had a second headache Sarina Ser primary care doctor, was prescribed hydrocodone. She has had a headache which is worse than the other 2. Headache is global, pounding, across the top of her head. She has had nausea and vomiting. Patient reports that this is the worst headache she has ever had.   Past Medical History  Diagnosis Date  . Diverticulitis   . Ovarian cyst    Past Surgical History  Procedure Laterality Date  . Appendectomy    . Tubal ligation    . Laparoscopic ovarian cystectomy    . Abdominal hysterectomy     No family history on file. History  Substance Use Topics  . Smoking status: Never Smoker   . Smokeless tobacco: Not on file  . Alcohol Use: No   OB History   Grav Para Term Preterm Abortions TAB SAB Ect Mult Living                 Review of Systems  Constitutional: Negative for fever.  Gastrointestinal: Positive for nausea and vomiting.  Neurological: Positive for headaches.  All other systems reviewed and are negative.    Allergies  Penicillins  Home Medications   Current Outpatient Rx  Name  Route  Sig  Dispense  Refill  . HYDROcodone-acetaminophen (NORCO/VICODIN) 5-325 MG per tablet   Oral   Take 2 tablets by mouth every 4 (four) hours as needed for pain.   20 tablet   0   . promethazine (PHENERGAN) 25 MG tablet   Oral   Take 25 mg by mouth every 6 (six) hours as needed for nausea or vomiting.         . calcium carbonate (OS-CAL - DOSED IN MG OF ELEMENTAL CALCIUM) 1250 MG tablet   Oral   Take  1 tablet by mouth daily.         . ciprofloxacin (CIPRO) 500 MG tablet   Oral   Take 1 tablet (500 mg total) by mouth 2 (two) times daily.   20 tablet   0   . diphenhydrAMINE (BENADRYL) 25 MG tablet   Oral   Take 25 mg by mouth every 6 (six) hours as needed for itching.         . metroNIDAZOLE (FLAGYL) 500 MG tablet   Oral   Take 1 tablet (500 mg total) by mouth 2 (two) times daily.   20 tablet   0   . Multiple Vitamin (MULTIVITAMIN) tablet   Oral   Take 1 tablet by mouth daily.         . ondansetron (ZOFRAN) 4 MG tablet   Oral   Take 1 tablet (4 mg total) by mouth every 6 (six) hours.   20 tablet   0    BP 126/78  Pulse 92  Temp(Src) 98.4 F (36.9 C) (Oral)  Resp 18  Ht 5\' 4"  (1.626 m)  Wt 200 lb (90.719 kg)  BMI 34.31 kg/m2  SpO2 100% Physical Exam  Constitutional: She is oriented to person, place, and time. She appears well-developed and well-nourished. No distress.  HENT:  Head: Normocephalic and atraumatic.  Right Ear: Hearing normal.  Left Ear: Hearing normal.  Nose: Nose normal.  Mouth/Throat: Oropharynx is clear and moist and mucous membranes are normal.  Eyes: Conjunctivae and EOM are normal. Pupils are equal, round, and reactive to light.  Neck: Normal range of motion. Neck supple.  Cardiovascular: Regular rhythm, S1 normal and S2 normal.  Exam reveals no gallop and no friction rub.   No murmur heard. Pulmonary/Chest: Effort normal and breath sounds normal. No respiratory distress. She exhibits no tenderness.  Abdominal: Soft. Normal appearance and bowel sounds are normal. There is no hepatosplenomegaly. There is no tenderness. There is no rebound, no guarding, no tenderness at McBurney's point and negative Murphy's sign. No hernia.  Musculoskeletal: Normal range of motion.  Neurological: She is alert and oriented to person, place, and time. She has normal strength. No cranial nerve deficit or sensory deficit. Coordination normal. GCS eye subscore  is 4. GCS verbal subscore is 5. GCS motor subscore is 6.  Skin: Skin is warm, dry and intact. No rash noted. No cyanosis.  Psychiatric: She has a normal mood and affect. Her speech is normal and behavior is normal. Thought content normal.    ED Course  LUMBAR PUNCTURE Date/Time: 06/12/2013 3:04 PM Performed by: Gilda Crease. Authorized by: Gilda Crease Consent: Verbal consent obtained. written consent obtained. Risks and benefits: risks, benefits and alternatives were discussed Consent given by: patient Patient understanding: patient states understanding of the procedure being performed Patient consent: the patient's understanding of the procedure matches consent given Procedure consent: procedure consent matches procedure scheduled Relevant documents: relevant documents present and verified Test results: test results available and properly labeled Site marked: the operative site was marked Imaging studies: imaging studies available Patient identity confirmed: verbally with patient and arm band Time out: Immediately prior to procedure a "time out" was called to verify the correct patient, procedure, equipment, support staff and site/side marked as required. Indications: evaluation for infection and evaluation for subarachnoid hemorrhage Anesthesia: local infiltration Local anesthetic: lidocaine 1% without epinephrine Anesthetic total: 5 ml Patient sedated: no Preparation: Patient was prepped and draped in the usual sterile fashion. Lumbar space: L4-L5 interspace Patient's position: sitting Needle gauge: 18 Needle type: spinal needle - Quincke tip Needle length: 3.5 in Number of attempts: 1 Opening pressure: 36 cm H2O Closing pressure: 34.5 cm H2O Fluid appearance: clear Tubes of fluid: 4 Total volume: 4 ml Post-procedure: site cleaned, pressure dressing applied and adhesive bandage applied Patient tolerance: Patient tolerated the procedure well with no  immediate complications.   (including critical care time) Labs Review Labs Reviewed  BASIC METABOLIC PANEL - Abnormal; Notable for the following:    Glucose, Bld 112 (*)    GFR calc non Af Amer 66 (*)    GFR calc Af Amer 76 (*)    All other components within normal limits  CSF CELL COUNT WITH DIFFERENTIAL - Abnormal; Notable for the following:    RBC Count, CSF 160 (*)    All other components within normal limits  CSF CELL COUNT WITH DIFFERENTIAL - Abnormal; Notable for the following:    RBC Count, CSF 1 (*)    All other components within normal limits  CSF CULTURE  CBC WITH DIFFERENTIAL  PROTIME-INR  GLUCOSE, CSF  PROTEIN, CSF   Imaging Review Ct Head Wo Contrast  06/12/2013   CLINICAL DATA:  Intermittent headaches which awaken patient from sleep  EXAM: CT HEAD WITHOUT CONTRAST  TECHNIQUE: Contiguous axial  images were obtained from the base of the skull through the vertex without intravenous contrast.  COMPARISON:  Brain MRI June 21, 2011  FINDINGS: Ventricles are normal in size and configuration. There is no mass, hemorrhage, extra-axial fluid collection, or midline shift. The gray-white compartments are normal. There is no demonstrable acute infarct. Bony calvarium appears intact. The mastoid air cells are clear.  IMPRESSION: Study within normal limits.   Electronically Signed   By: Bretta Bang M.D.   On: 06/12/2013 09:37    EKG Interpretation   None       MDM   1. Headache   2. Vomiting    Patient presents to ER for evaluation of headache. Patient has had 3 separate headaches, he twists in the last. She reports that her headache today is the worst headache she has ever had. Patient does report some stiffness of her neck. She does not have frank meningismus. There is no fever. The patient had significant improvement with IV fluids, Decadron, Reglan, Toradol, Benadryl. This is migraine headache treatment, no analgesics were provided other than Toradol. It is  reassuring that she had significant improvement.  As this was the worse headache she has ever had, CT scan was performed. It was negative. This was very reassuring. I did recommend lumbar puncture to rule out subarachnoid hemorrhage in infection. After discussing risks and benefits, patient to consent. CSF studies are entirely unremarkable. Patient is much improved and therefore was discharged home to followup as needed. Return if her symptoms worsen.    Gilda Crease, MD 06/12/13 [phone]

## 2013-06-12 NOTE — ED Notes (Signed)
Supplies placed at bedside for md. 

## 2013-06-12 NOTE — ED Notes (Signed)
Consent for LP signed by pt.

## 2013-06-12 NOTE — ED Notes (Signed)
Pt states having intermittent headache that was waking her up at night. Saw PCP Tuesday and prescribed vicodin and phenergan without relief.

## 2013-06-12 NOTE — ED Notes (Signed)
md at bedside performing LP with staff assist. Pt instructed that she must lay flat for a minimum of one hour post procedure. Pt verbalizes understanding.

## 2013-06-15 LAB — CSF CULTURE W GRAM STAIN: Culture: NO GROWTH

## 2013-09-04 ENCOUNTER — Other Ambulatory Visit: Payer: Self-pay | Admitting: Obstetrics and Gynecology

## 2013-09-04 DIAGNOSIS — Z1231 Encounter for screening mammogram for malignant neoplasm of breast: Secondary | ICD-10-CM

## 2013-09-29 ENCOUNTER — Ambulatory Visit: Payer: Self-pay | Admitting: Podiatry

## 2013-10-01 ENCOUNTER — Encounter: Payer: Self-pay | Admitting: Podiatry

## 2013-10-01 ENCOUNTER — Ambulatory Visit (INDEPENDENT_AMBULATORY_CARE_PROVIDER_SITE_OTHER): Payer: BC Managed Care – PPO | Admitting: Podiatry

## 2013-10-01 ENCOUNTER — Ambulatory Visit (INDEPENDENT_AMBULATORY_CARE_PROVIDER_SITE_OTHER): Payer: BC Managed Care – PPO

## 2013-10-01 VITALS — BP 124/84 | HR 71 | Resp 16

## 2013-10-01 DIAGNOSIS — M7660 Achilles tendinitis, unspecified leg: Secondary | ICD-10-CM

## 2013-10-01 DIAGNOSIS — M7661 Achilles tendinitis, right leg: Secondary | ICD-10-CM

## 2013-10-01 DIAGNOSIS — M722 Plantar fascial fibromatosis: Secondary | ICD-10-CM

## 2013-10-01 MED ORDER — MELOXICAM 15 MG PO TABS: 15.0000 mg | ORAL_TABLET | Freq: Every day | ORAL | Status: DC

## 2013-10-01 MED ORDER — METHYLPREDNISOLONE (PAK) 4 MG PO TABS: ORAL_TABLET | ORAL | Status: DC

## 2013-10-01 NOTE — Progress Notes (Signed)
The  Left heel is hurting and the right achilles is starting to act up.  Objective: Vital signs are stable she is alert and oriented x3. Pulses are palpable bilateral. She has pain on palpation medial calcaneal tubercle of the left heel minimally so on the right heel.  Assessment: Plantar fasciitis left greater than right.  Plan: Injected the left heel today with Kenalog and local anesthetic bilateral plantar fascial brace is were dispensed. She will continue conservative therapies at home with night splints and anti-inflammatories. She was also scanned for orthotics while she was here today.

## 2013-10-06 ENCOUNTER — Ambulatory Visit (HOSPITAL_COMMUNITY)
Admission: RE | Admit: 2013-10-06 | Discharge: 2013-10-06 | Disposition: A | Discharge status: Home / Self Care | Payer: BC Managed Care – PPO | Source: Ambulatory Visit | Attending: Obstetrics and Gynecology | Admitting: Obstetrics and Gynecology

## 2013-10-06 DIAGNOSIS — Z1231 Encounter for screening mammogram for malignant neoplasm of breast: Secondary | ICD-10-CM | POA: Insufficient documentation

## 2013-10-14 ENCOUNTER — Ambulatory Visit (HOSPITAL_COMMUNITY)
Admission: RE | Admit: 2013-10-14 | Discharge: 2013-10-14 | Disposition: A | Discharge status: Home / Self Care | Payer: BC Managed Care – PPO | Source: Ambulatory Visit | Attending: Family Medicine | Admitting: Family Medicine

## 2013-10-14 ENCOUNTER — Other Ambulatory Visit (HOSPITAL_COMMUNITY): Payer: Self-pay | Admitting: Family Medicine

## 2013-10-14 DIAGNOSIS — R200 Anesthesia of skin: Secondary | ICD-10-CM

## 2013-10-14 DIAGNOSIS — R209 Unspecified disturbances of skin sensation: Secondary | ICD-10-CM | POA: Insufficient documentation

## 2013-10-14 DIAGNOSIS — H547 Unspecified visual loss: Secondary | ICD-10-CM

## 2013-10-14 DIAGNOSIS — H538 Other visual disturbances: Secondary | ICD-10-CM | POA: Insufficient documentation

## 2013-10-27 ENCOUNTER — Ambulatory Visit: Payer: BC Managed Care – PPO | Admitting: Podiatry

## 2013-11-12 ENCOUNTER — Ambulatory Visit: Payer: BC Managed Care – PPO | Admitting: Podiatry

## 2013-11-17 ENCOUNTER — Encounter: Payer: Self-pay | Admitting: Podiatry

## 2013-11-17 ENCOUNTER — Ambulatory Visit (INDEPENDENT_AMBULATORY_CARE_PROVIDER_SITE_OTHER): Payer: BC Managed Care – PPO | Admitting: Podiatry

## 2013-11-17 VITALS — BP 122/82 | HR 70 | Resp 16

## 2013-11-17 DIAGNOSIS — M722 Plantar fascial fibromatosis: Secondary | ICD-10-CM

## 2013-11-17 NOTE — Patient Instructions (Signed)

## 2013-11-17 NOTE — Progress Notes (Signed)
She presents today with an improvement to her heel pain. At this point she states it is considerably better.  Objective: Pulses are palpable positive heel pain on palpation medial calcaneal tubercle.  Plantar fasciitis.  Plan: Reinjected the heel today continue all conservative therapies I will followup with her in one month at which time we will reevaluate heel pain and evaluate the orthotics which were dispensed today she was given both oral and written home-going instructions for the use and care of his orthotics.

## 2013-12-22 ENCOUNTER — Ambulatory Visit: Payer: BC Managed Care – PPO | Admitting: Podiatry

## 2014-02-04 ENCOUNTER — Encounter (HOSPITAL_BASED_OUTPATIENT_CLINIC_OR_DEPARTMENT_OTHER): Payer: Self-pay | Admitting: Emergency Medicine

## 2014-02-04 ENCOUNTER — Emergency Department (HOSPITAL_BASED_OUTPATIENT_CLINIC_OR_DEPARTMENT_OTHER)
Admission: EM | Admit: 2014-02-04 | Discharge: 2014-02-04 | Disposition: A | Discharge status: Home / Self Care | Payer: BC Managed Care – PPO | Attending: Emergency Medicine | Admitting: Emergency Medicine

## 2014-02-04 ENCOUNTER — Emergency Department (HOSPITAL_BASED_OUTPATIENT_CLINIC_OR_DEPARTMENT_OTHER): Payer: BC Managed Care – PPO

## 2014-02-04 DIAGNOSIS — Z79899 Other long term (current) drug therapy: Secondary | ICD-10-CM | POA: Diagnosis not present

## 2014-02-04 DIAGNOSIS — K5900 Constipation, unspecified: Secondary | ICD-10-CM | POA: Insufficient documentation

## 2014-02-04 DIAGNOSIS — Z880 Allergy status to penicillin: Secondary | ICD-10-CM | POA: Insufficient documentation

## 2014-02-04 DIAGNOSIS — N83209 Unspecified ovarian cyst, unspecified side: Secondary | ICD-10-CM | POA: Diagnosis not present

## 2014-02-04 DIAGNOSIS — K5732 Diverticulitis of large intestine without perforation or abscess without bleeding: Secondary | ICD-10-CM | POA: Diagnosis not present

## 2014-02-04 DIAGNOSIS — R350 Frequency of micturition: Secondary | ICD-10-CM | POA: Insufficient documentation

## 2014-02-04 DIAGNOSIS — N83202 Unspecified ovarian cyst, left side: Secondary | ICD-10-CM

## 2014-02-04 DIAGNOSIS — R109 Unspecified abdominal pain: Secondary | ICD-10-CM | POA: Insufficient documentation

## 2014-02-04 LAB — URINALYSIS, ROUTINE W REFLEX MICROSCOPIC
Bilirubin Urine: NEGATIVE
GLUCOSE, UA: NEGATIVE mg/dL
Ketones, ur: NEGATIVE mg/dL
LEUKOCYTES UA: NEGATIVE
Nitrite: NEGATIVE
PH: 5 (ref 5.0–8.0)
Protein, ur: NEGATIVE mg/dL
SPECIFIC GRAVITY, URINE: 1.008 (ref 1.005–1.030)
Urobilinogen, UA: 0.2 mg/dL (ref 0.0–1.0)

## 2014-02-04 LAB — CBC WITH DIFFERENTIAL/PLATELET
BASOS PCT: 0 % (ref 0–1)
Basophils Absolute: 0 10*3/uL (ref 0.0–0.1)
EOS ABS: 0.1 10*3/uL (ref 0.0–0.7)
Eosinophils Relative: 1 % (ref 0–5)
HCT: 41.7 % (ref 36.0–46.0)
Hemoglobin: 14.3 g/dL (ref 12.0–15.0)
Lymphocytes Relative: 8 % — ABNORMAL LOW (ref 12–46)
Lymphs Abs: 1.1 10*3/uL (ref 0.7–4.0)
MCH: 30.3 pg (ref 26.0–34.0)
MCHC: 34.3 g/dL (ref 30.0–36.0)
MCV: 88.3 fL (ref 78.0–100.0)
MONOS PCT: 5 % (ref 3–12)
Monocytes Absolute: 0.7 10*3/uL (ref 0.1–1.0)
Neutro Abs: 12.3 10*3/uL — ABNORMAL HIGH (ref 1.7–7.7)
Neutrophils Relative %: 86 % — ABNORMAL HIGH (ref 43–77)
PLATELETS: 309 10*3/uL (ref 150–400)
RBC: 4.72 MIL/uL (ref 3.87–5.11)
RDW: 12.1 % (ref 11.5–15.5)
WBC: 14.2 10*3/uL — ABNORMAL HIGH (ref 4.0–10.5)

## 2014-02-04 LAB — BASIC METABOLIC PANEL
Anion gap: 11 (ref 5–15)
BUN: 10 mg/dL (ref 6–23)
CALCIUM: 9.8 mg/dL (ref 8.4–10.5)
CO2: 25 mEq/L (ref 19–32)
Chloride: 104 mEq/L (ref 96–112)
Creatinine, Ser: 1 mg/dL (ref 0.50–1.10)
GFR, EST AFRICAN AMERICAN: 75 mL/min — AB (ref >90)
GFR, EST NON AFRICAN AMERICAN: 65 mL/min — AB (ref >90)
Glucose, Bld: 122 mg/dL — ABNORMAL HIGH (ref 70–99)
Potassium: 4.2 mEq/L (ref 3.7–5.3)
SODIUM: 140 meq/L (ref 137–147)

## 2014-02-04 LAB — URINE MICROSCOPIC-ADD ON

## 2014-02-04 MED ORDER — SODIUM CHLORIDE 0.9 % IV BOLUS (SEPSIS)
1000.0000 mL | Freq: Once | INTRAVENOUS | Status: AC
  Administered 2014-02-04: 1000 mL via INTRAVENOUS

## 2014-02-04 MED ORDER — ONDANSETRON 8 MG PO TBDP
ORAL_TABLET | ORAL | Status: DC
Start: 2014-02-04 — End: 2014-02-04
  Filled 2014-02-04: qty 1

## 2014-02-04 MED ORDER — DOCUSATE SODIUM 100 MG PO CAPS
100.0000 mg | ORAL_CAPSULE | Freq: Once | ORAL | Status: AC
  Administered 2014-02-04: 100 mg via ORAL
  Filled 2014-02-04: qty 1

## 2014-02-04 MED ORDER — ONDANSETRON 8 MG PO TBDP
8.0000 mg | ORAL_TABLET | Freq: Once | ORAL | Status: AC
  Administered 2014-02-04: 8 mg via ORAL

## 2014-02-04 MED ORDER — CIPROFLOXACIN HCL 500 MG PO TABS: 500.0000 mg | ORAL_TABLET | Freq: Two times a day (BID) | ORAL | Status: DC

## 2014-02-04 MED ORDER — HYDROCODONE-ACETAMINOPHEN 5-325 MG PO TABS: 1.0000 | ORAL_TABLET | Freq: Four times a day (QID) | ORAL | Status: DC | PRN

## 2014-02-04 MED ORDER — MORPHINE SULFATE 4 MG/ML IJ SOLN
4.0000 mg | Freq: Once | INTRAMUSCULAR | Status: AC
  Administered 2014-02-04: 4 mg via INTRAVENOUS
  Filled 2014-02-04: qty 1

## 2014-02-04 MED ORDER — ONDANSETRON HCL 4 MG/2ML IJ SOLN
4.0000 mg | Freq: Once | INTRAMUSCULAR | Status: AC
  Administered 2014-02-04: 4 mg via INTRAVENOUS
  Filled 2014-02-04: qty 2

## 2014-02-04 MED ORDER — KETOROLAC TROMETHAMINE 30 MG/ML IJ SOLN
30.0000 mg | Freq: Once | INTRAMUSCULAR | Status: AC
  Administered 2014-02-04: 30 mg via INTRAVENOUS
  Filled 2014-02-04: qty 1

## 2014-02-04 MED ORDER — METRONIDAZOLE 500 MG PO TABS: 500.0000 mg | ORAL_TABLET | Freq: Two times a day (BID) | ORAL | Status: DC

## 2014-02-04 MED ORDER — DICYCLOMINE HCL 10 MG/ML IM SOLN
20.0000 mg | Freq: Once | INTRAMUSCULAR | Status: AC
  Administered 2014-02-04: 20 mg via INTRAMUSCULAR
  Filled 2014-02-04: qty 2

## 2014-02-04 MED ORDER — METRONIDAZOLE 500 MG PO TABS
500.0000 mg | ORAL_TABLET | Freq: Once | ORAL | Status: AC
  Administered 2014-02-04: 500 mg via ORAL
  Filled 2014-02-04: qty 1

## 2014-02-04 MED ORDER — CIPROFLOXACIN HCL 500 MG PO TABS
500.0000 mg | ORAL_TABLET | Freq: Once | ORAL | Status: AC
  Administered 2014-02-04: 500 mg via ORAL
  Filled 2014-02-04: qty 1

## 2014-02-04 MED ORDER — ONDANSETRON 8 MG PO TBDP: ORAL_TABLET | ORAL | Status: DC

## 2014-02-04 NOTE — ED Notes (Signed)
Patient transported to CT 

## 2014-02-04 NOTE — ED Notes (Signed)
Patient preparing for discharge. 

## 2014-02-04 NOTE — ED Notes (Signed)
abd pain all week with vomiting. Pt states that she thought it was constipation but no OTC meds have helped.

## 2014-02-04 NOTE — ED Notes (Signed)
Pt assisted to car by Alvis Lemmingsawn, EMT. Husband is driving her home.

## 2014-02-04 NOTE — ED Provider Notes (Signed)
CSN: [id number]     Arrival date & time 02/04/14  0404 History   First MD Initiated Contact with Patient 02/04/14 [id number]     Chief Complaint  Patient presents with  . Abdominal Pain     (Consider location/radiation/quality/duration/timing/severity/associated sxs/prior Treatment) Patient is a 47 y.o. female presenting with abdominal pain. The history is provided by the patient.  Abdominal Pain Pain location:  L flank, LLQ and suprapubic Pain quality: cramping   Pain radiates to:  Does not radiate Pain severity:  Severe Onset quality:  Gradual Timing:  Constant Progression:  Worsening Chronicity:  New Context: not laxative use and not trauma   Relieved by:  Nothing Worsened by:  Nothing tried Ineffective treatments: alka seltzer. Associated symptoms: constipation, nausea and vomiting   Associated symptoms: no anorexia, no diarrhea and no dysuria   Associated symptoms comment:  Frequency Risk factors: no recent hospitalization     Past Medical History  Diagnosis Date  . Diverticulitis   . Ovarian cyst    Past Surgical History  Procedure Laterality Date  . Appendectomy    . Tubal ligation    . Laparoscopic ovarian cystectomy    . Abdominal hysterectomy     History reviewed. No pertinent family history. History  Substance Use Topics  . Smoking status: Never Smoker   . Smokeless tobacco: Never Used  . Alcohol Use: No   OB History   Grav Para Term Preterm Abortions TAB SAB Ect Mult Living                 Review of Systems  Gastrointestinal: Positive for nausea, vomiting, abdominal pain and constipation. Negative for diarrhea and anorexia.  Genitourinary: Positive for frequency. Negative for dysuria.  All other systems reviewed and are negative.     Allergies  Penicillins  Home Medications   Prior to Admission medications   Medication Sig Start Date End Date Taking? Authorizing Provider  calcium carbonate (OS-CAL - DOSED IN MG OF ELEMENTAL CALCIUM) 1250 MG  tablet Take 1 tablet by mouth daily.    Historical Provider, MD  diphenhydrAMINE (BENADRYL) 25 MG tablet Take 25 mg by mouth every 6 (six) hours as needed for itching.    Historical Provider, MD  meloxicam (MOBIC) 15 MG tablet Take 1 tablet (15 mg total) by mouth daily. 10/01/13   Max T Hyatt, DPM  methylPREDNIsolone (MEDROL DOSPACK) 4 MG tablet follow package directions 10/01/13   Max T Al Corpus, DPM  Multiple Vitamin (MULTIVITAMIN) tablet Take 1 tablet by mouth daily.    Historical Provider, MD   BP 139/99  Pulse 116  Temp(Src) 99.4 F (37.4 C) (Oral)  Resp 18  Ht 5\' 4"  (1.626 m)  Wt 218 lb (98.884 kg)  BMI 37.40 kg/m2  SpO2 100% Physical Exam  Constitutional: She is oriented to person, place, and time. She appears well-developed and well-nourished. No distress.  HENT:  Head: Normocephalic and atraumatic.  Mouth/Throat: Oropharynx is clear and moist.  Eyes: Conjunctivae are normal. Pupils are equal, round, and reactive to light.  Neck: Normal range of motion. Neck supple.  Cardiovascular: Normal rate, regular rhythm and intact distal pulses.   Pulmonary/Chest: Effort normal and breath sounds normal. She has no wheezes. She has no rales.  Abdominal: Soft. Bowel sounds are increased. There is tenderness in the suprapubic area. There is no rigidity, no rebound, no guarding, no tenderness at McBurney's point and negative Murphy's sign.  Palpable stool throughout colon  Musculoskeletal: Normal range of motion.  Neurological: She  is alert and oriented to person, place, and time.  Skin: Skin is warm and dry.  Psychiatric: She has a normal mood and affect.    ED Course  Procedures (including critical care time) Labs Review Labs Reviewed  URINALYSIS, ROUTINE W REFLEX MICROSCOPIC - Abnormal; Notable for the following:    Hgb urine dipstick TRACE (*)    All other components within normal limits  URINE MICROSCOPIC-ADD ON  CBC WITH DIFFERENTIAL  BASIC METABOLIC PANEL    Imaging Review No  results found.   EKG Interpretation None     Results for orders placed during the hospital encounter of 02/04/14  URINALYSIS, ROUTINE W REFLEX MICROSCOPIC      Result Value Ref Range   Color, Urine YELLOW  YELLOW   APPearance CLEAR  CLEAR   Specific Gravity, Urine 1.008  1.005 - 1.030   pH 5.0  5.0 - 8.0   Glucose, UA NEGATIVE  NEGATIVE mg/dL   Hgb urine dipstick TRACE (*) NEGATIVE   Bilirubin Urine NEGATIVE  NEGATIVE   Ketones, ur NEGATIVE  NEGATIVE mg/dL   Protein, ur NEGATIVE  NEGATIVE mg/dL   Urobilinogen, UA 0.2  0.0 - 1.0 mg/dL   Nitrite NEGATIVE  NEGATIVE   Leukocytes, UA NEGATIVE  NEGATIVE  URINE MICROSCOPIC-ADD ON      Result Value Ref Range   Squamous Epithelial / LPF RARE  RARE   WBC, UA 0-2  <3 WBC/hpf   RBC / HPF 0-2  <3 RBC/hpf   Bacteria, UA RARE  RARE  CBC WITH DIFFERENTIAL      Result Value Ref Range   WBC 14.2 (*) 4.0 - 10.5 K/uL   RBC 4.72  3.87 - 5.11 MIL/uL   Hemoglobin 14.3  12.0 - 15.0 g/dL   HCT 16.1  09.6 - 04.5 %   MCV 88.3  78.0 - 100.0 fL   MCH 30.3  26.0 - 34.0 pg   MCHC 34.3  30.0 - 36.0 g/dL   RDW 40.9  81.1 - 91.4 %   Platelets 309  150 - 400 K/uL   Neutrophils Relative % 86 (*) 43 - 77 %   Neutro Abs 12.3 (*) 1.7 - 7.7 K/uL   Lymphocytes Relative 8 (*) 12 - 46 %   Lymphs Abs 1.1  0.7 - 4.0 K/uL   Monocytes Relative 5  3 - 12 %   Monocytes Absolute 0.7  0.1 - 1.0 K/uL   Eosinophils Relative 1  0 - 5 %   Eosinophils Absolute 0.1  0.0 - 0.7 K/uL   Basophils Relative 0  0 - 1 %   Basophils Absolute 0.0  0.0 - 0.1 K/uL  BASIC METABOLIC PANEL      Result Value Ref Range   Sodium 140  137 - 147 mEq/L   Potassium 4.2  3.7 - 5.3 mEq/L   Chloride 104  96 - 112 mEq/L   CO2 25  19 - 32 mEq/L   Glucose, Bld 122 (*) 70 - 99 mg/dL   BUN 10  6 - 23 mg/dL   Creatinine, Ser 7.82  0.50 - 1.10 mg/dL   Calcium 9.8  8.4 - 95.6 mg/dL   GFR calc non Af Amer 65 (*) >90 mL/min   GFR calc Af Amer 75 (*) >90 mL/min   Anion gap 11  5 - 15   Ct Abdomen  Pelvis Wo Contrast  02/04/2014   CLINICAL DATA:  Left-sided abdominal pain, nausea, constipation and hematuria.  EXAM: CT  ABDOMEN AND PELVIS WITHOUT CONTRAST  TECHNIQUE: Multidetector CT imaging of the abdomen and pelvis was performed following the standard protocol without IV contrast.  COMPARISON:  CT of the abdomen and pelvis from 08/27/2012  FINDINGS: The visualized lung bases are clear.  The liver and spleen are unremarkable in appearance. The gallbladder is within normal limits. The pancreas and adrenal glands are unremarkable.  The kidneys are unremarkable in appearance. There is no evidence of hydronephrosis. No renal or ureteral stones are seen. No perinephric stranding is appreciated.  No free fluid is identified. The small bowel is unremarkable in appearance. The stomach is within normal limits. No acute vascular abnormalities are seen.  The patient is status post appendectomy. Mild diverticulosis is noted along the entirety of the colon.  There is a large inflamed diverticulum along the mid sigmoid colon, filled with stool, with mild associated colonic wall thickening and surrounding soft tissue inflammation. Trace free fluid is noted within the pelvis. Findings are compatible with acute diverticulitis. There is no evidence of perforation or abscess formation at this time.  The bladder is mildly distended and grossly unremarkable. The patient is status post hysterectomy. A 3.3 cm left adnexal cystic lesion is noted. No suspicious adnexal masses are seen. No inguinal lymphadenopathy is seen.  No acute osseous abnormalities are identified.  IMPRESSION: 1. Acute diverticulitis noted at the mid sigmoid colon, with a large inflamed diverticulum, filled with stool, and surrounding soft tissue inflammation. Mild colonic wall thickening noted. Trace free fluid seen in the pelvis. No evidence of perforation or abscess formation at this time. 2. Mild diverticulosis noted along the entirety of the colon. 3. 3.3 cm  left adnexal cystic lesion noted, likely physiologic in nature. Pelvic ultrasound could be considered on an elective nonemergent basis, as deemed clinically appropriate.   Electronically Signed   By: Roanna Raider M.D.   On: 02/04/2014 05:24    Medications  sodium chloride 0.9 % bolus 1,000 mL (0 mLs Intravenous Stopped 02/04/14 0536)  ketorolac (TORADOL) 30 MG/ML injection 30 mg (30 mg Intravenous Given 02/04/14 0457)  morphine 4 MG/ML injection 4 mg (4 mg Intravenous Given 02/04/14 0457)  ondansetron (ZOFRAN) injection 4 mg (4 mg Intravenous Given 02/04/14 0457)  dicyclomine (BENTYL) injection 20 mg (20 mg Intramuscular Given 02/04/14 0457)  ciprofloxacin (CIPRO) tablet 500 mg (500 mg Oral Given 02/04/14 0551)  metroNIDAZOLE (FLAGYL) tablet 500 mg (500 mg Oral Given 02/04/14 0551)  docusate sodium (COLACE) capsule 100 mg (100 mg Oral Given 02/04/14 0551)    MDM   Final diagnoses:  None    Diverticulitis without abscess or perforation: Pain is markedly improved post medication.  Patient has constipation as well and this will likely be made worse with narcotic pain medication therefore have recommended a bowel regimen of miralax.  Patient states this has not worked for her in the past.  Colace and Sennokot are good alternatives.  Avoid constipating foods and drink large amounts of water to ensure maximal efficacy of medications and to loosen stool.  Would avoid laxatives that cause cramping and bloating as then it will be difficult to differentiate worsening of diverticulitis from laxative effects.  No enemas as these can distend the colon and lead to perforation.  Will prescribe 10 days of cipro and flagyl.  No milk products within 60 minutes of cipro as these can bind up the antibiotic rendering it ineffective and no alcohol while taking flagyl or narcotic pain medication.  Will also prescribe zofran ODT for  nausea in order that patient can keep down her pain medication and antibiotics.  Follow up  outpatient pelvic ultrasound to ensure resolution of cystic lesion seen on CT.  Follow up with Dr. Wynelle Link for recheck.  Strict abdominal pain return precautions given.      Jasmine Awe, MD 02/04/14 [phone]

## 2014-02-16 ENCOUNTER — Other Ambulatory Visit: Payer: Self-pay | Admitting: Family Medicine

## 2014-02-16 DIAGNOSIS — N9489 Other specified conditions associated with female genital organs and menstrual cycle: Secondary | ICD-10-CM

## 2014-02-17 ENCOUNTER — Ambulatory Visit
Admission: RE | Admit: 2014-02-17 | Discharge: 2014-02-17 | Disposition: A | Discharge status: Home / Self Care | Payer: BC Managed Care – PPO | Source: Ambulatory Visit | Attending: Family Medicine | Admitting: Family Medicine

## 2014-02-17 DIAGNOSIS — N9489 Other specified conditions associated with female genital organs and menstrual cycle: Secondary | ICD-10-CM

## 2014-02-23 ENCOUNTER — Other Ambulatory Visit: Payer: BC Managed Care – PPO

## 2014-08-24 ENCOUNTER — Other Ambulatory Visit (HOSPITAL_COMMUNITY): Payer: Self-pay | Admitting: Obstetrics and Gynecology

## 2014-08-24 DIAGNOSIS — Z1231 Encounter for screening mammogram for malignant neoplasm of breast: Secondary | ICD-10-CM

## 2014-10-11 ENCOUNTER — Ambulatory Visit (HOSPITAL_COMMUNITY)
Admission: RE | Admit: 2014-10-11 | Discharge: 2014-10-11 | Disposition: A | Discharge status: Home / Self Care | Payer: BC Managed Care – PPO | Source: Ambulatory Visit | Attending: Obstetrics and Gynecology | Admitting: Obstetrics and Gynecology

## 2014-10-11 DIAGNOSIS — Z1231 Encounter for screening mammogram for malignant neoplasm of breast: Secondary | ICD-10-CM | POA: Diagnosis not present

## 2015-06-16 ENCOUNTER — Encounter: Payer: BC Managed Care – PPO | Attending: Family Medicine | Admitting: *Deleted

## 2015-06-16 ENCOUNTER — Encounter: Payer: Self-pay | Admitting: *Deleted

## 2015-06-16 DIAGNOSIS — Z713 Dietary counseling and surveillance: Secondary | ICD-10-CM | POA: Insufficient documentation

## 2015-06-16 DIAGNOSIS — Z6837 Body mass index (BMI) 37.0-37.9, adult: Secondary | ICD-10-CM | POA: Diagnosis not present

## 2015-06-16 DIAGNOSIS — E669 Obesity, unspecified: Secondary | ICD-10-CM | POA: Diagnosis not present

## 2015-06-16 NOTE — Patient Instructions (Addendum)
Aim for 30 minutes walking 5 days/week Aim to use MyPlate for meal planning.   Eat breakfast at home  For lunch either bring leftovers or use MyPlate when eating out Eat more mindfully, aim for meals to last 20 minutes by taking smaller bites, chewing slowly, taking sips of water in between bites and stopping when you're comfortable

## 2015-06-16 NOTE — Progress Notes (Signed)
Medical Nutrition Therapy:  Appt start time: 0930 end time:  1030.   Assessment:  Primary concerns today: Deborah Chaney is here upon referral for desire to lose weight.  She has tried Weight Watchers in the past and "was successful", but not when she tired recently, she was not successful.  She has been prescribed an unknown weight loss pill, but that made her "heart flutter" so she stopped.  She has considered HCG injections.  She states her mom died about a year ago and she feels maybe she is eating more as a result. She states she is currently at her heaviest weight, but that here weigh has been increasing steadily as an adult.  Current weight is ~227 lb , per patient.  Her lowest weight as an adult was ~135 lb.  Her normal weight is ~160s lb.  She would like to weigh ~150 lb.  She weighed this in her 68s and feels that was a place where she "looked good and felt good." Parents were "big" per patient.  Mom was ~275 lb normally, but weighed ~140 lb when she died.  Per patient, mom gained most of her weigh after age 12.  Dad is also heavy.  Brother is also heavy and sister is lighter, but shorter.  Mom lost her significant weight due to her health deteriorating.  Dad is trying to start exercising and "watches what he eats."  Deborah Chaney does her own grocery shopping and shares cooking responsibilities with her son.  She states the mainly bake and fry chicken maybe 2 times/month.  Eat out daily: cafe for breakfast, lunch is fast food or [place], or whatever is available.  When at home, she does eat in the kitchen with her husband. She is a fast eater and feels she is always rushing.  The tv is on while she is eating.    Preferred Learning Style:   No preference indicated   Learning Readiness:   Ready   MEDICATIONS: see list   DIETARY INTAKE:  Usual eating pattern includes 3 meals and 1 snacks per day.  Everyday foods: convenience, high-fat foods from restaurants Avoided foods include none.    24-hr  recall:  B ( AM): 1 egg, 1/2 hashbrown, 1 slice toast, couple slices bacon  Snk ( AM): none  L ( PM): [employer] potpie; Chinese rice and chicken and broccoli; hot dog with chips; 6-8" [place] sub sandwich Snk ( PM): fruit cup or small bag pretzels D ( PM): mashed potatoes, green beans, baked chicken wings; [place] and gravy with rice and green beans; [place] and broccoli with mushrooms; [place] with green beans and mac-n-cheese Snk ( PM): sometimes 1/2 cookie (rarely eats whole dessert out of "guilt") Beverages: soda, water  Usual physical activity:  Back, joints, feet hurt.  Wakes up in the morning and is in pain.  Stretches in the morning to loosen up.  This has been going on 2-3 months Stretches when she watches tv.  Walks at work ~1 mi most days.  Tries to park as far away.  Used to walk during the spring 2-3 days/week but not now that it is cold  Estimated energy needs: 1800-2000 calories   Nutritional Diagnosis:  NB-2.1 Physical inactivity As related to cold weather.  As evidenced by patient report.    Intervention:  Nutrition counseling provided.  Discussed HAES principles and encouraged Marzell do focus on improving her health rather than using "quick fix" weight loss methods.  Discussed eating more at home to reduce added  saturated/trans fats and sodium as well as increasing physical activity to improved metabolic parameters.  Discussed MyPlate recommendations for meal planning and mindful eating.  Goals Aim for 30 minutes walking 5 days/week Aim to use MyPlate for meal planning.   Eat breakfast at home  For lunch either bring leftovers or use MyPlate when eating out Eat more mindfully, aim for meals to last 20 minutes by taking smaller bites, chewing slowly, taking sips of water in between bites and stopping when you're comfortable   Teaching Method Utilized:  Visual Auditory   Handouts given during visit include:  Breakfast ideas  MyPlate  Barriers to learning/adherence to  lifestyle change: planning ahead, cold weather  Demonstrated degree of understanding via:  Teach Back   Monitoring/Evaluation:  Dietary intake, exercise, and body weight prn.

## 2015-09-01 ENCOUNTER — Encounter: Payer: Self-pay | Admitting: Podiatry

## 2015-09-01 ENCOUNTER — Ambulatory Visit (INDEPENDENT_AMBULATORY_CARE_PROVIDER_SITE_OTHER): Payer: BC Managed Care – PPO | Admitting: Podiatry

## 2015-09-01 VITALS — BP 141/88 | HR 102 | Resp 12

## 2015-09-01 DIAGNOSIS — M722 Plantar fascial fibromatosis: Secondary | ICD-10-CM

## 2015-09-01 NOTE — Patient Instructions (Signed)
Pre-Operative Instructions  Congratulations, you have decided to take an important step to improving your quality of life.  You can be assured that the doctors of [facility] will be with you every step of the way.  1. Plan to be at the surgery center/hospital at least 1 (one) hour prior to your scheduled time unless otherwise directed by the surgical center/hospital staff.  You must have a responsible adult accompany you, remain during the surgery and drive you home.  Make sure you have directions to the surgical center/hospital and know how to get there on time. 2. For hospital based surgery you will need to obtain a history and physical form from your family physician within 1 month prior to the date of surgery- we will give you a form for you primary physician.  3. We make every effort to accommodate the date you request for surgery.  There are however, times where surgery dates or times have to be moved.  We will contact you as soon as possible if a change in schedule is required.   4. No Aspirin/Ibuprofen for one week before surgery.  If you are on aspirin, any non-steroidal anti-inflammatory medications (Mobic, Aleve, Ibuprofen) you should stop taking it 7 days prior to your surgery.  You make take Tylenol  For pain prior to surgery.  5. Medications- If you are taking daily heart and blood pressure medications, seizure, reflux, allergy, asthma, anxiety, pain or diabetes medications, make sure the surgery center/hospital is aware before the day of surgery so they may notify you which medications to take or avoid the day of surgery. 6. No food or drink after midnight the night before surgery unless directed otherwise by surgical center/hospital staff. 7. No alcoholic beverages 24 hours prior to surgery.  No smoking 24 hours prior to or 24 hours after surgery. 8. Wear loose pants or shorts- loose enough to fit over bandages, boots, and casts. 9. No slip on shoes, sneakers are best. 10. Bring  your boot with you to the surgery center/hospital.  Also bring crutches or a walker if your physician has prescribed it for you.  If you do not have this equipment, it will be provided for you after surgery. 11. If you have not been contracted by the surgery center/hospital by the day before your surgery, call to confirm the date and time of your surgery. 12. Leave-time from work may vary depending on the type of surgery you have.  Appropriate arrangements should be made prior to surgery with your employer. 13. Prescriptions will be provided immediately following surgery by your doctor.  Have these filled as soon as possible after surgery and take the medication as directed. 14. Remove nail polish on the operative foot. 15. Wash the night before surgery.  The night before surgery wash the foot and leg well with the antibacterial soap provided and water paying special attention to beneath the toenails and in between the toes.  Rinse thoroughly with water and dry well with a towel.  Perform this wash unless told not to do so by your physician.  Enclosed: 1 Ice pack (please put in freezer the night before surgery)   1 Hibiclens skin cleaner   Pre-op Instructions  If you have any questions regarding the instructions, do not hesitate to call our office.  [place]: [address] , [place] [address] [phone]  [place]: [address] [place], [place] [address] [phone]  [place]: [address]  [place], [place] [address] [phone]  Dr. Richard   Tuchman DPM, Dr. Norman Regal DPM Dr. Richard Sikora DPM, Dr. M. Todd Hyatt DPM, Dr. Kathryn Egerton DPM 

## 2015-09-07 NOTE — Progress Notes (Signed)
She presents today to discuss surgical intervention over her fifth metatarsophalangeal joint area. She states it is becoming more uncomfortable last time as gone on shoe gear is becoming painful. It is starting to affect her ability to perform her everyday activities and like to consider having it corrected. She denies any changes in her past medical history medications allergies surgery social history.  Objective: Vital signs are stable alert and oriented 3. Pulses are palpable. Neurologic sensorium is intact per Semmes-Weinstein monofilament. Deep tendon reflexes are intact. Muscle strength +5 over 5 dorsiflexion 5 for inverters everters all intrinsic musculature is intact. Orthopedic evaluation and states all joints distal to the ankle level for range of motion without crepitation. Cutaneous evaluation demonstrates supple well-hydrated cutis with exception of the area thickening overlying the fifth metatarsal head of the left foot. The fifth metatarsal of the left foot is very prominent dorsally more than likely due to a dorsal spur. Radiographs taken today do confirm a hypertrophic superolateral condyle of the fifth metatarsal head with a minor amount of lateral deviation of the fifth metatarsal resulting in a tailor's bunion.  Assessment: Tailor's bunion deformity fifth left exostosis fifth metatarsal head left.  Plan: We discussed the etiology pathology conservative versus surgical therapies. At this point we consented her for a tailor's bunion correction/possible fifth metatarsal osteotomy with screw left. I answered all the questions regarding either of these 2 procedures to the best of my ability in layman's terms. We did discuss the possible postop complications which may include postop pain bleeding swelling infection recurrence numbness need for further surgery over correction under correction loss of digit loss of limb loss of life and chronic pain. I will follow-up with her at the time of  surgery.

## 2015-09-14 ENCOUNTER — Other Ambulatory Visit: Payer: Self-pay

## 2015-09-14 DIAGNOSIS — Z1231 Encounter for screening mammogram for malignant neoplasm of breast: Secondary | ICD-10-CM

## 2015-10-06 ENCOUNTER — Telehealth: Payer: Self-pay | Admitting: *Deleted

## 2015-10-06 NOTE — Telephone Encounter (Signed)
"  I'd like to schedule my surgery."  Do you have a date in mind?  "I'd like to do it May 12."  That date is fine.  You will need to register with the surgical center, instructions are in the brochure from the surgical center.  Someone will call from the surgical center with the arrival time.

## 2015-10-13 ENCOUNTER — Ambulatory Visit
Admission: RE | Admit: 2015-10-13 | Discharge: 2015-10-13 | Disposition: A | Discharge status: Home / Self Care | Payer: BC Managed Care – PPO | Source: Ambulatory Visit

## 2015-10-13 DIAGNOSIS — Z1231 Encounter for screening mammogram for malignant neoplasm of breast: Secondary | ICD-10-CM

## 2015-10-20 ENCOUNTER — Telehealth: Payer: Self-pay | Admitting: *Deleted

## 2015-10-20 NOTE — Telephone Encounter (Signed)
My surgery is scheduled for May 5 with Dr. Al CorpusHyatt.  I have to file FMLA papers with my job.  I guess first I need to get a note from you guys saying what's going to be done and anticipated time out.  Once the procedure is done there's some paperwork that will need to be filled out.  I assume that you guys are familiar with this.  Please give me a call back.  Thank you so much."

## 2015-10-21 ENCOUNTER — Encounter: Payer: Self-pay | Admitting: *Deleted

## 2015-10-21 NOTE — Telephone Encounter (Signed)
I'm returning your call.  I'm doing your letter as we speak.  Would you like me to fax it to you?  "That will be fine.  You can fax it to [phone].  I will also be needing some FMLA papers filled out.  Who does that?"  Deborah JamesJessica Chaney takes care of disability.  "Can you get her to call me please?"  I will send her a message.  Note was faxed to patient.

## 2015-10-26 ENCOUNTER — Telehealth: Payer: Self-pay | Admitting: *Deleted

## 2015-10-26 DIAGNOSIS — M722 Plantar fascial fibromatosis: Secondary | ICD-10-CM

## 2015-10-26 NOTE — Telephone Encounter (Addendum)
Pt states she would like to know who to drop her FMLA paperwork off with.  I instructed pt to give to the front receptionist and she would give it to Tomasa HoseJ. Quintana, RN for completion.  11/04/2015-Pt states she needs Tomasa HoseJ. Quintana, RN to write a letter with the new surgery date, and request a call. 11/11/2015-Post op courtesy call-Pt states she is doing okay,but the pain is beginning to kick in.  I told pt to take her pain medication as directed, if tolerates may take Ibuprofen as package directs in between doses of the pain medication, remain in the boot at all times to sleep, walk, may open to ice and rotate the foot, but must put it back on, do not allow the foot to dangle or be up on it over 15 min/hour, leave the surgical dressing in place until changed at the 1st POV, take the pain medication, and call our office with concerns.  Pt states understanding. 12/21/2015-Pt asked when her work paperwork would be available.

## 2015-11-02 ENCOUNTER — Other Ambulatory Visit: Payer: Self-pay | Admitting: Podiatry

## 2015-11-02 MED ORDER — OXYCODONE-ACETAMINOPHEN 10-325 MG PO TABS: 1.0000 | ORAL_TABLET | Freq: Four times a day (QID) | ORAL | Status: DC | PRN

## 2015-11-02 MED ORDER — CLINDAMYCIN HCL 150 MG PO CAPS: 150.0000 mg | ORAL_CAPSULE | Freq: Three times a day (TID) | ORAL | Status: DC

## 2015-11-02 MED ORDER — PROMETHAZINE HCL 25 MG PO TABS: 25.0000 mg | ORAL_TABLET | Freq: Three times a day (TID) | ORAL | Status: DC | PRN

## 2015-11-03 ENCOUNTER — Telehealth: Payer: Self-pay | Admitting: *Deleted

## 2015-11-03 NOTE — Telephone Encounter (Signed)
I'm calling to let you know we're going to have to reschedule your surgery.  Dr. Al CorpusHyatt is sick.  Would you like to reschedule to Thursday of next week?  "I thought he only does surgeries on Friday."  Friday is his surgery day but due to the circumstance, he's going to cancel his office schedule that day.  Does he have anything on Friday of next week."  Unfortunately he is full that day."  I guess put me down for Thursday of next week.  Will it be the same time?"  I can't give you an arrival time.  Someone will call you with the arrival time from the surgical center a day or two prior to with the surgery date.  "So that means I can't have anything to eat for 12 hours?"  You cannot have anything to eat or drink 6-8 hours prior to surgery.  "I'm sorry for any inconvenience this may cause as well as Dr. Al CorpusHyatt.  "Okay, thank you.  I'll let my supervisor know."  I called and informed [facility]am [facility]Cynthia at [facility].

## 2015-11-10 ENCOUNTER — Encounter: Payer: Self-pay | Admitting: Podiatry

## 2015-11-10 DIAGNOSIS — M21542 Acquired clubfoot, left foot: Secondary | ICD-10-CM

## 2015-11-17 ENCOUNTER — Ambulatory Visit (INDEPENDENT_AMBULATORY_CARE_PROVIDER_SITE_OTHER): Payer: BC Managed Care – PPO | Admitting: Podiatry

## 2015-11-17 ENCOUNTER — Ambulatory Visit (INDEPENDENT_AMBULATORY_CARE_PROVIDER_SITE_OTHER): Payer: BC Managed Care – PPO

## 2015-11-17 DIAGNOSIS — Z9889 Other specified postprocedural states: Secondary | ICD-10-CM

## 2015-11-17 DIAGNOSIS — M21622 Bunionette of left foot: Secondary | ICD-10-CM | POA: Diagnosis not present

## 2015-11-17 NOTE — Progress Notes (Signed)
She presents today 1 week status post fifth metatarsal osteotomy left foot. She states this seems to be doing just fine.  Objective: Vital signs are stable she is alert and oriented 3. Pulses are strong and palpable. Dry sterile dressing was removed demonstrates minimal edema and no erythema saline as drainage or odor incision line appears to be well coapted and sutures are in place. Radiograph does demonstrate a fifth metatarsal osteotomy with double screw fixation in a rectus position.  Assessment: Well-healing surgical foot 1 week left.  Plan: [place] today dry sterile compressive dressing follow-up with me in 1 week for suture removal.

## 2015-11-29 ENCOUNTER — Encounter: Payer: Self-pay | Admitting: Podiatry

## 2015-11-29 ENCOUNTER — Ambulatory Visit (INDEPENDENT_AMBULATORY_CARE_PROVIDER_SITE_OTHER): Payer: BC Managed Care – PPO | Admitting: Podiatry

## 2015-11-29 VITALS — BP 118/90 | HR 74 | Resp 16

## 2015-11-29 DIAGNOSIS — M21622 Bunionette of left foot: Secondary | ICD-10-CM

## 2015-11-29 DIAGNOSIS — Z9889 Other specified postprocedural states: Secondary | ICD-10-CM

## 2015-11-29 NOTE — Progress Notes (Signed)
She presents today 2 and half weeks status post fifth metatarsal osteotomy of the left foot. She states that she's doing just fine with this. She denies fever chills nausea vomiting muscle aches and pains. States that her family has been taking very good care of her.  Objective: Vital signs are stable she is alert and oriented 3 she presents today very well-dressed with her hair done makeup on. Neurologic sensorium intact vascular status is unchanged mild edema to the left foot after the dressing was removed.  Assessment: Well-healing surgical foot.  Plan: Sutures were removed today and I placed her in a Darco shoe and a compression anklet. I will allow her to start washing this foot thoroughly and demonstrated massage therapy. I will follow-up with her in just a few weeks at which time we will consider letting her go back to work.

## 2015-12-14 ENCOUNTER — Telehealth: Payer: Self-pay | Admitting: *Deleted

## 2015-12-14 NOTE — Telephone Encounter (Signed)
Lynwood DawleyDara states pt had surgery in 10/2015 and Dr. Dan HumphreysWalker wanted to know if she needed to be premedicated.  I told Dara pt would need to be premedicated.

## 2015-12-15 ENCOUNTER — Encounter: Payer: Self-pay | Admitting: Podiatry

## 2015-12-15 ENCOUNTER — Ambulatory Visit (INDEPENDENT_AMBULATORY_CARE_PROVIDER_SITE_OTHER): Payer: BC Managed Care – PPO

## 2015-12-15 ENCOUNTER — Ambulatory Visit (INDEPENDENT_AMBULATORY_CARE_PROVIDER_SITE_OTHER): Payer: BC Managed Care – PPO | Admitting: Podiatry

## 2015-12-15 VITALS — BP 136/82 | HR 107 | Resp 16

## 2015-12-15 DIAGNOSIS — M21622 Bunionette of left foot: Secondary | ICD-10-CM

## 2015-12-15 DIAGNOSIS — Z9889 Other specified postprocedural states: Secondary | ICD-10-CM

## 2015-12-15 NOTE — Progress Notes (Signed)
She presents today for follow-up of her fifth metatarsal osteotomy left foot. She states it is doing just fine feels okay.  Objective: Vital signs are stable she is alert and oriented 3. She is one-month status post fifth metatarsal osteotomy. Minimal edema no cellulitis drainage or odor. Radiographs demonstrate a well-healing osteotomy with double screw fixation.  Assessment: Well-healing surgical foot left 1 month.  Plan: Follow up with me in 1 month for another set of x-rays will allow her to get back into regular shoe gear. She will call me with questions or concerns.

## 2015-12-22 NOTE — Telephone Encounter (Signed)
Paperwork was faxed yesterday 

## 2016-01-12 ENCOUNTER — Ambulatory Visit (INDEPENDENT_AMBULATORY_CARE_PROVIDER_SITE_OTHER): Payer: BC Managed Care – PPO

## 2016-01-12 ENCOUNTER — Encounter: Payer: Self-pay | Admitting: Podiatry

## 2016-01-12 ENCOUNTER — Ambulatory Visit (INDEPENDENT_AMBULATORY_CARE_PROVIDER_SITE_OTHER): Payer: BC Managed Care – PPO | Admitting: Podiatry

## 2016-01-12 DIAGNOSIS — Z9889 Other specified postprocedural states: Secondary | ICD-10-CM

## 2016-01-12 DIAGNOSIS — M21622 Bunionette of left foot: Secondary | ICD-10-CM | POA: Diagnosis not present

## 2016-01-14 NOTE — Progress Notes (Signed)
She presents today 2 months status post fifth metatarsal osteotomy left foot. She states that it swells occasionally but other than that she's doing very well.  Objective: Vital signs are stable alert and oriented 3 pulses are palpable. Neurologic sensorium is intact. Deep tendon reflexes are intact. Muscle strength is normal bilateral. Fifth metatarsophalangeal joint incision site appears to be healing very nicely see no signs of infection radiographs taken today demonstrate well-healing surgical osteotomy site with internal fixation which appears to be in good position.  Assessment: Fifth metatarsal osteotomy well-healed.  Plan: Follow up with me as needed.

## 2016-02-16 NOTE — Progress Notes (Signed)
DOS 11/10/2015 5th metatarsal osteotomy with screws left foot

## 2016-09-18 ENCOUNTER — Ambulatory Visit
Admission: RE | Admit: 2016-09-18 | Discharge: 2016-09-18 | Disposition: A | Discharge status: Home / Self Care | Payer: BC Managed Care – PPO | Source: Ambulatory Visit | Attending: Family Medicine | Admitting: Family Medicine

## 2016-09-18 ENCOUNTER — Other Ambulatory Visit: Payer: Self-pay | Admitting: Family Medicine

## 2016-09-18 DIAGNOSIS — R109 Unspecified abdominal pain: Secondary | ICD-10-CM

## 2016-09-18 MED ORDER — IOPAMIDOL (ISOVUE-M 300) INJECTION 61%: 15.0000 mL | Freq: Once | INTRAMUSCULAR | Status: DC | PRN

## 2016-09-18 MED ORDER — IOPAMIDOL (ISOVUE-300) INJECTION 61%
125.0000 mL | Freq: Once | INTRAVENOUS | Status: AC | PRN
  Administered 2016-09-18: 125 mL via INTRAVENOUS

## 2016-09-19 ENCOUNTER — Other Ambulatory Visit: Payer: Self-pay | Admitting: Family Medicine

## 2016-09-19 DIAGNOSIS — K5792 Diverticulitis of intestine, part unspecified, without perforation or abscess without bleeding: Secondary | ICD-10-CM

## 2016-09-24 ENCOUNTER — Ambulatory Visit
Admission: RE | Admit: 2016-09-24 | Discharge: 2016-09-24 | Disposition: A | Discharge status: Home / Self Care | Payer: BC Managed Care – PPO | Source: Ambulatory Visit | Attending: Family Medicine | Admitting: Family Medicine

## 2016-09-24 DIAGNOSIS — K5792 Diverticulitis of intestine, part unspecified, without perforation or abscess without bleeding: Secondary | ICD-10-CM

## 2016-09-24 MED ORDER — IOPAMIDOL (ISOVUE-300) INJECTION 61%: 100.0000 mL | Freq: Once | INTRAVENOUS | Status: DC | PRN

## 2016-10-17 ENCOUNTER — Other Ambulatory Visit: Payer: Self-pay | Admitting: Obstetrics and Gynecology

## 2016-10-17 DIAGNOSIS — Z1231 Encounter for screening mammogram for malignant neoplasm of breast: Secondary | ICD-10-CM

## 2016-10-26 ENCOUNTER — Ambulatory Visit: Payer: BC Managed Care – PPO

## 2016-12-10 ENCOUNTER — Ambulatory Visit: Payer: BC Managed Care – PPO

## 2017-02-21 ENCOUNTER — Encounter: Payer: Self-pay | Admitting: Podiatry

## 2017-02-21 ENCOUNTER — Ambulatory Visit (INDEPENDENT_AMBULATORY_CARE_PROVIDER_SITE_OTHER): Payer: BC Managed Care – PPO | Admitting: Podiatry

## 2017-02-21 ENCOUNTER — Ambulatory Visit (INDEPENDENT_AMBULATORY_CARE_PROVIDER_SITE_OTHER): Payer: BC Managed Care – PPO

## 2017-02-21 DIAGNOSIS — M722 Plantar fascial fibromatosis: Secondary | ICD-10-CM

## 2017-02-21 DIAGNOSIS — J069 Acute upper respiratory infection, unspecified: Secondary | ICD-10-CM | POA: Insufficient documentation

## 2017-02-21 DIAGNOSIS — N63 Unspecified lump in unspecified breast: Secondary | ICD-10-CM | POA: Insufficient documentation

## 2017-02-21 DIAGNOSIS — M2580 Other specified joint disorders, unspecified joint: Secondary | ICD-10-CM | POA: Diagnosis not present

## 2017-02-21 DIAGNOSIS — M21622 Bunionette of left foot: Secondary | ICD-10-CM | POA: Diagnosis not present

## 2017-02-21 DIAGNOSIS — E669 Obesity, unspecified: Secondary | ICD-10-CM | POA: Insufficient documentation

## 2017-02-21 DIAGNOSIS — Z9889 Other specified postprocedural states: Secondary | ICD-10-CM | POA: Diagnosis not present

## 2017-02-21 DIAGNOSIS — N83209 Unspecified ovarian cyst, unspecified side: Secondary | ICD-10-CM | POA: Insufficient documentation

## 2017-02-21 DIAGNOSIS — R1030 Lower abdominal pain, unspecified: Secondary | ICD-10-CM | POA: Insufficient documentation

## 2017-02-21 NOTE — Progress Notes (Signed)
She presents today date of surgery 11/10/2015 with a chief complaint of pain beneath the first metatarsophalangeal joint. She states that she has also started to develop calluses on her forefoot. She also does on the state that she has recently injured her back and notices that her posture is more kyphotic. Denies any other traumas. States that the surgical sites fifth metatarsal osteotomy is non-tender and non-problematic. She does relate that this really started hurting when she was walking on a treadmill.  Objective: Vital signs are stable she is alert and oriented 3. Pulses are strongly palpable she has great range of motion of all of her joints. She does have tenderness on direct palpation of the fibular sesamoid of the metatarsophalangeal joint left foot. Radiographs taken today do not demonstrate any type of osseus abnormalities to this area. Screws are still intact to the fifth metatarsal left foot and inability he'll uneventfully. She has started to develop reactive hyperkeratotic lesions on the plantar aspect of the forefoot most likely due to her shoe gear choices as well as her kyphosis.  Assessment: Fibular sesamoiditis.  Plan: Discussed etiology and pathology inserted versus surgical therapies. At this point I injected the area today with Kenalog and local anesthetic to the fibular sesamoid. Recommended that she try hard soled shoes. We may need to see ligating her some orthotics made.

## 2017-03-14 ENCOUNTER — Ambulatory Visit: Payer: BC Managed Care – PPO | Admitting: Podiatry

## 2018-05-22 ENCOUNTER — Encounter (HOSPITAL_BASED_OUTPATIENT_CLINIC_OR_DEPARTMENT_OTHER): Payer: Self-pay | Admitting: *Deleted

## 2018-05-22 ENCOUNTER — Other Ambulatory Visit: Payer: Self-pay

## 2018-05-22 ENCOUNTER — Emergency Department (HOSPITAL_BASED_OUTPATIENT_CLINIC_OR_DEPARTMENT_OTHER)
Admission: EM | Admit: 2018-05-22 | Discharge: 2018-05-23 | Disposition: A | Discharge status: Home / Self Care | Payer: BC Managed Care – PPO | Attending: Emergency Medicine | Admitting: Emergency Medicine

## 2018-05-22 ENCOUNTER — Emergency Department (HOSPITAL_BASED_OUTPATIENT_CLINIC_OR_DEPARTMENT_OTHER): Payer: BC Managed Care – PPO

## 2018-05-22 DIAGNOSIS — J40 Bronchitis, not specified as acute or chronic: Secondary | ICD-10-CM | POA: Diagnosis not present

## 2018-05-22 DIAGNOSIS — J069 Acute upper respiratory infection, unspecified: Secondary | ICD-10-CM | POA: Diagnosis not present

## 2018-05-22 DIAGNOSIS — Z7982 Long term (current) use of aspirin: Secondary | ICD-10-CM | POA: Diagnosis not present

## 2018-05-22 DIAGNOSIS — Z79899 Other long term (current) drug therapy: Secondary | ICD-10-CM | POA: Diagnosis not present

## 2018-05-22 DIAGNOSIS — R05 Cough: Secondary | ICD-10-CM | POA: Diagnosis present

## 2018-05-22 MED ORDER — OXYMETAZOLINE HCL 0.05 % NA SOLN
2.0000 | Freq: Once | NASAL | Status: AC
  Administered 2018-05-23: 2 via NASAL
  Filled 2018-05-22: qty 15

## 2018-05-22 MED ORDER — SODIUM CHLORIDE 0.9 % IV SOLN
2.0000 g | Freq: Once | INTRAVENOUS | Status: AC
  Administered 2018-05-23: 2 g via INTRAVENOUS
  Filled 2018-05-22: qty 20

## 2018-05-22 MED ORDER — ONDANSETRON HCL 4 MG/2ML IJ SOLN
4.0000 mg | Freq: Once | INTRAMUSCULAR | Status: AC
  Administered 2018-05-23: 4 mg via INTRAVENOUS
  Filled 2018-05-22: qty 2

## 2018-05-22 MED ORDER — KETOROLAC TROMETHAMINE 15 MG/ML IJ SOLN
15.0000 mg | Freq: Once | INTRAMUSCULAR | Status: AC
  Administered 2018-05-23: 15 mg via INTRAVENOUS
  Filled 2018-05-22: qty 1

## 2018-05-22 MED ORDER — SODIUM CHLORIDE 0.9 % IV BOLUS
1000.0000 mL | Freq: Once | INTRAVENOUS | Status: AC
  Administered 2018-05-23: 1000 mL via INTRAVENOUS

## 2018-05-22 NOTE — ED Notes (Signed)
Pt. Reports she has had a cough with nasal congestion and runny eyes  And a hoarse voice since Monday.

## 2018-05-22 NOTE — ED Notes (Signed)
Family at bedside. 

## 2018-05-22 NOTE — ED Provider Notes (Signed)
[facility] EMERGENCY DEPARTMENT Provider Note   CSN: [id number] Arrival date & time: 05/22/18  2120     History   Chief Complaint Chief Complaint  Patient presents with  . Cough    HPI Deborah Chaney is a 52 y.o. female.  HPI  52 yo F with no significant PMHx here with cough, nasal congestion, and fever/chills. Pt went to her PCP's office on Monday for flu, TDap shots as well as starting amlodipine for her blood pressure. She states that the next day, she developed nasal congestion, sore throat, and cough. She's had significant amount of nasal congestion and yellow-red drainage, as well as dry nonproductive cough. She's felt cold/had chills as well. No myalgias. No specific sick contacts but states her MD's office was very busy. No abd pain. No nausea. She has had some vomiting, however, in which she brought up mostly "mucus." She's been able ot eat/drink. No other sx. Has not really taken or tried anything.  Past Medical History:  Diagnosis Date  . Diverticulitis   . Ovarian cyst     Patient Active Problem List   Diagnosis Date Noted  . Breast lump 02/21/2017  . Cyst of ovary 02/21/2017  . Lower abdominal pain 02/21/2017  . Obesity 02/21/2017  . Upper respiratory infection 02/21/2017    Past Surgical History:  Procedure Laterality Date  . ABDOMINAL HYSTERECTOMY    . APPENDECTOMY    . LAPAROSCOPIC OVARIAN CYSTECTOMY    . TUBAL LIGATION       OB History   None      Home Medications    Prior to Admission medications   Medication Sig Start Date End Date Taking? Authorizing Provider  amLODipine (NORVASC) 5 MG tablet Take 5 mg by mouth daily.   Yes [provider]  Ascorbic Acid (VITAMIN C) 100 MG tablet Take 100 mg by mouth daily.    [provider]  aspirin 81 MG tablet Take 81 mg by mouth daily.    [provider]  benzonatate (TESSALON) 100 MG capsule Take 1 capsule (100 mg total) by mouth 3 (three) times daily as  needed for cough. 05/23/18   Shaune PollackIsaacs, Zalman Hull, MD  calcium carbonate (OS-CAL - DOSED IN MG OF ELEMENTAL CALCIUM) 1250 MG tablet Take 1 tablet by mouth daily.    [provider]  Calcium Carbonate-Vit D-Min (CALCIUM 1200 PO) Take by mouth.    [provider]  cholecalciferol (VITAMIN D) 1000 UNITS tablet Take 1,000 Units by mouth daily.    [provider]  diphenhydrAMINE (BENADRYL) 25 MG tablet Take 25 mg by mouth every 6 (six) hours as needed for itching. Reported on 06/16/2015    [provider]  doxycycline (VIBRAMYCIN) 100 MG capsule Take 1 capsule (100 mg total) by mouth 2 (two) times daily for 7 days. 05/23/18 05/30/18  Shaune PollackIsaacs, Kalicia Dufresne, MD  meloxicam (MOBIC) 15 MG tablet Take 1 tablet (15 mg total) by mouth daily. Patient not taking: Reported on 06/16/2015 10/01/13   Ernestene KielHyatt, Max T, DPM  Multiple Vitamin (MULTIVITAMIN) tablet Take 1 tablet by mouth daily. Reported on 06/16/2015    [provider]  ondansetron (ZOFRAN ODT) 4 MG disintegrating tablet Take 1 tablet (4 mg total) by mouth every 8 (eight) hours as needed for nausea or vomiting. 05/23/18   Shaune PollackIsaacs, Antonieta Slaven, MD  oxyCODONE-acetaminophen (PERCOCET) 10-325 MG tablet Take 1 tablet by mouth every 6 (six) hours as needed for pain. 11/02/15   Hyatt, Max T, DPM  promethazine (  PHENERGAN) 25 MG tablet Take 1 tablet (25 mg total) by mouth every 8 (eight) hours as needed for nausea or vomiting. 11/02/15   Hyatt, Max T, DPM    Family History Family History  Problem Relation Age of Onset  . Hypertension Mother   . Diabetes Mother   . Hypertension Father   . Diabetes Father     Social History Social History   Tobacco Use  . Smoking status: Never Smoker  . Smokeless tobacco: Never Used  Substance Use Topics  . Alcohol use: No  . Drug use: No     Allergies   Penicillins   Review of Systems Review of Systems  Constitutional: Positive for chills and fever. Negative for fatigue.  HENT: Positive  for congestion, rhinorrhea and sore throat.   Eyes: Negative for visual disturbance.  Respiratory: Positive for cough. Negative for shortness of breath and wheezing.   Cardiovascular: Negative for chest pain and leg swelling.  Gastrointestinal: Positive for vomiting. Negative for abdominal pain, diarrhea and nausea.  Genitourinary: Negative for dysuria and flank pain.  Musculoskeletal: Negative for neck pain and neck stiffness.  Skin: Negative for rash and wound.  Allergic/Immunologic: Negative for immunocompromised state.  Neurological: Negative for syncope, weakness and headaches.  All other systems reviewed and are negative.    Physical Exam Updated Vital Signs BP 117/76   Pulse (!) 102   Temp 98.7 F (37.1 C) (Oral)   Resp 20   Ht 5\' 4"  (1.626 m)   Wt 104.3 kg   SpO2 100%   BMI 39.48 kg/m   Physical Exam  Constitutional: She is oriented to person, place, and time. She appears well-developed and well-nourished. No distress.  HENT:  Head: Normocephalic and atraumatic.  Mildly dry MM. Posterior pharyngeal erythema w/ tonsillar exudates. Serous effusions b/l TM.  Eyes: Pupils are equal, round, and reactive to light. Conjunctivae are normal.  Neck: Neck supple.  Cardiovascular: Regular rhythm and normal heart sounds. Tachycardia present.  Pulmonary/Chest: Effort normal. No respiratory distress. She has no wheezes.  Normal WOB and clear BS bilaterally.  Abdominal: She exhibits no distension.  Musculoskeletal: She exhibits no edema.  Neurological: She is alert and oriented to person, place, and time. She exhibits normal muscle tone.  Skin: Skin is warm. Capillary refill takes less than 2 seconds. No rash noted.  Nursing note and vitals reviewed.    ED Treatments / Results  Labs (all labs ordered are listed, but only abnormal results are displayed) Labs Reviewed  CBC WITH DIFFERENTIAL/PLATELET - Abnormal; Notable for the following components:      Result Value   WBC  12.6 (*)    Neutro Abs 10.5 (*)    All other components within normal limits  BASIC METABOLIC PANEL - Abnormal; Notable for the following components:   Glucose, Bld 113 (*)    Calcium 8.8 (*)    All other components within normal limits    EKG EKG Interpretation  Date/Time:  Friday May 23 2018 01:57:39 EST Ventricular Rate:  110 PR Interval:    QRS Duration: 84 QT Interval:  337 QTC Calculation: 456 R Axis:   65 Text Interpretation:  Sinus tachycardia No old tracing to compare Confirmed by Shaune Pollack [phone]) on 05/23/2018 3:42:37 AM   Radiology Dg Chest 2 View  Result Date: 05/22/2018 CLINICAL DATA:  Cough and vomiting for 1 day. EXAM: CHEST - 2 VIEW COMPARISON:  None. FINDINGS: The heart size and mediastinal contours are within normal limits. Both lungs  are clear. The visualized skeletal structures are unremarkable. IMPRESSION: No active cardiopulmonary disease. Electronically Signed   By: Myles Rosenthal M.D.   On: 05/22/2018 22:57    Procedures Procedures (including critical care time)  Medications Ordered in ED Medications  sodium chloride 0.9 % bolus 1,000 mL (0 mLs Intravenous Stopped 05/23/18 0156)  cefTRIAXone (ROCEPHIN) 2 g in sodium chloride 0.9 % 100 mL IVPB (0 g Intravenous Stopped 05/23/18 0117)  ondansetron (ZOFRAN) injection 4 mg (4 mg Intravenous Given 05/23/18 0035)  oxymetazoline (AFRIN) 0.05 % nasal spray 2 spray (2 sprays Each Nare Given 05/23/18 0035)  ketorolac (TORADOL) 15 MG/ML injection 15 mg (15 mg Intravenous Given 05/23/18 0034)  sodium chloride 0.9 % bolus 1,000 mL (0 mLs Intravenous Stopped 05/23/18 0253)  dexamethasone (DECADRON) injection 10 mg (10 mg Intravenous Given 05/23/18 0232)     Initial Impression / Assessment and Plan / ED Course  I have reviewed the triage vital signs and the nursing notes.  Pertinent labs & imaging results that were available during my care of the patient were reviewed by me and considered in my medical  decision making (see chart for details).     52 yo F here with nasal congestion, cough, sinus drainage after being at PCP on Monday and s/p flu shot. Suspect this could be attenuated viral response to influenza versus, more likely, a URI/bronchitis that she picked up at her PCP's office. She is mildly dehydrated and tachycardic here, though per records she has multiple HR documented >100s. Pt given fluids, sx control here w/ improvement. HR 90-low 100s which is likely baseline and 2/2 her underlying URI. She has a mild leukocytosis which is likely reactive. BMP unremarkable. She is not hypoxic. CXR is clear.   Given known sick contacts, otherwise well appearance, will tx as possible viral URI/early bronchitis and d/c with encouraged fluids, close PCP f/u. Return precautions given.  Final Clinical Impressions(s) / ED Diagnoses   Final diagnoses:  Bronchitis  Upper respiratory tract infection, unspecified type    ED Discharge Orders         Ordered    doxycycline (VIBRAMYCIN) 100 MG capsule  2 times daily     05/23/18 0240    benzonatate (TESSALON) 100 MG capsule  3 times daily PRN     05/23/18 0240    ondansetron (ZOFRAN ODT) 4 MG disintegrating tablet  Every 8 hours PRN     05/23/18 0240           Shaune Pollack, MD 05/23/18 [phone]

## 2018-05-22 NOTE — ED Triage Notes (Signed)
Cough tonight. States she is coughing so hard it makes her vomit. Nasal drainage x 3 days.

## 2018-05-23 ENCOUNTER — Other Ambulatory Visit: Payer: Self-pay

## 2018-05-23 DIAGNOSIS — J40 Bronchitis, not specified as acute or chronic: Secondary | ICD-10-CM | POA: Diagnosis not present

## 2018-05-23 LAB — CBC WITH DIFFERENTIAL/PLATELET
Abs Immature Granulocytes: 0.04 10*3/uL (ref 0.00–0.07)
BASOS PCT: 0 %
Basophils Absolute: 0 10*3/uL (ref 0.0–0.1)
EOS ABS: 0.1 10*3/uL (ref 0.0–0.5)
EOS PCT: 1 %
HEMATOCRIT: 39.9 % (ref 36.0–46.0)
Hemoglobin: 13 g/dL (ref 12.0–15.0)
Immature Granulocytes: 0 %
LYMPHS ABS: 1.1 10*3/uL (ref 0.7–4.0)
Lymphocytes Relative: 9 %
MCH: 29.1 pg (ref 26.0–34.0)
MCHC: 32.6 g/dL (ref 30.0–36.0)
MCV: 89.3 fL (ref 80.0–100.0)
MONOS PCT: 6 %
Monocytes Absolute: 0.7 10*3/uL (ref 0.1–1.0)
NEUTROS PCT: 84 %
NRBC: 0 % (ref 0.0–0.2)
Neutro Abs: 10.5 10*3/uL — ABNORMAL HIGH (ref 1.7–7.7)
PLATELETS: 265 10*3/uL (ref 150–400)
RBC: 4.47 MIL/uL (ref 3.87–5.11)
RDW: 12.8 % (ref 11.5–15.5)
WBC: 12.6 10*3/uL — ABNORMAL HIGH (ref 4.0–10.5)

## 2018-05-23 LAB — BASIC METABOLIC PANEL
Anion gap: 8 (ref 5–15)
BUN: 19 mg/dL (ref 6–20)
CALCIUM: 8.8 mg/dL — AB (ref 8.9–10.3)
CO2: 22 mmol/L (ref 22–32)
Chloride: 107 mmol/L (ref 98–111)
Creatinine, Ser: 0.95 mg/dL (ref 0.44–1.00)
GFR calc Af Amer: >60 mL/min (ref >60)
GLUCOSE: 113 mg/dL — AB (ref 70–99)
Potassium: 3.7 mmol/L (ref 3.5–5.1)
Sodium: 137 mmol/L (ref 135–145)

## 2018-05-23 MED ORDER — BENZONATATE 100 MG PO CAPS: 100.0000 mg | ORAL_CAPSULE | Freq: Three times a day (TID) | ORAL | 0 refills | Status: DC | PRN

## 2018-05-23 MED ORDER — SODIUM CHLORIDE 0.9 % IV BOLUS
1000.0000 mL | Freq: Once | INTRAVENOUS | Status: AC
  Administered 2018-05-23: 1000 mL via INTRAVENOUS

## 2018-05-23 MED ORDER — ONDANSETRON 4 MG PO TBDP: 4.0000 mg | ORAL_TABLET | Freq: Three times a day (TID) | ORAL | 0 refills | Status: DC | PRN

## 2018-05-23 MED ORDER — DOXYCYCLINE HYCLATE 100 MG PO CAPS: 100.0000 mg | ORAL_CAPSULE | Freq: Two times a day (BID) | ORAL | 0 refills | Status: AC

## 2018-05-23 MED ORDER — DEXAMETHASONE SODIUM PHOSPHATE 10 MG/ML IJ SOLN
10.0000 mg | Freq: Once | INTRAMUSCULAR | Status: AC
  Administered 2018-05-23: 10 mg via INTRAVENOUS
  Filled 2018-05-23: qty 1

## 2018-05-23 NOTE — Discharge Instructions (Signed)
Drink plenty of fluids  Take the antibiotic and medications as prescribed  I recommend over-the-counter Mucinex, Tylenol, and/or Advil as needed

## 2018-07-10 ENCOUNTER — Ambulatory Visit: Payer: BC Managed Care – PPO

## 2018-08-14 ENCOUNTER — Other Ambulatory Visit: Payer: Self-pay | Admitting: Podiatry

## 2018-08-14 ENCOUNTER — Ambulatory Visit (INDEPENDENT_AMBULATORY_CARE_PROVIDER_SITE_OTHER): Payer: BC Managed Care – PPO | Admitting: Podiatry

## 2018-08-14 ENCOUNTER — Encounter: Payer: Self-pay | Admitting: Podiatry

## 2018-08-14 ENCOUNTER — Ambulatory Visit (INDEPENDENT_AMBULATORY_CARE_PROVIDER_SITE_OTHER): Payer: BC Managed Care – PPO

## 2018-08-14 DIAGNOSIS — L600 Ingrowing nail: Secondary | ICD-10-CM | POA: Diagnosis not present

## 2018-08-14 DIAGNOSIS — M79671 Pain in right foot: Secondary | ICD-10-CM

## 2018-08-14 DIAGNOSIS — M722 Plantar fascial fibromatosis: Secondary | ICD-10-CM

## 2018-08-14 DIAGNOSIS — M2141 Flat foot [pes planus] (acquired), right foot: Secondary | ICD-10-CM

## 2018-08-14 DIAGNOSIS — M2142 Flat foot [pes planus] (acquired), left foot: Secondary | ICD-10-CM | POA: Diagnosis not present

## 2018-08-14 DIAGNOSIS — M79672 Pain in left foot: Principal | ICD-10-CM

## 2018-08-14 MED ORDER — NEOMYCIN-POLYMYXIN-HC 1 % OT SOLN: OTIC | 1 refills | Status: DC

## 2018-08-14 NOTE — Progress Notes (Signed)
She presents today after having not seen her for quite some time with a chief complaint of ingrown toenails tibial borders of the hallux bilaterally.  She states that she is also having some medial arch pain.  Objective: Vital signs are stable she alert and oriented x3 she has pain to palpation medial [place] tubercles bilateral.  Radiographs confirm soft tissue increase in density plantar fashion beginning insertion sites.  Toenails are sharply incurvated along the tibial border of the hallux bilaterally.  Painful palpation with mild erythema no purulence no malodor.  Assessment: Plantar fasciitis bilateral.  Ingrown toenail tibial border hallux bilateral.  Plan: Discussed etiology pathology conservative surgical therapies at this point in time performed chemical matricectomy's after sterile Betadine skin prep tolerated procedure well without complications.  Also injected 20 mg Kenalog 5 mg Marcaine point maximal tenderness of the bilateral heel medial aspect.  Tolerated seizure well.  Also started her on meloxicam.

## 2018-08-14 NOTE — Patient Instructions (Signed)

## 2018-08-15 ENCOUNTER — Ambulatory Visit (INDEPENDENT_AMBULATORY_CARE_PROVIDER_SITE_OTHER): Payer: BC Managed Care – PPO | Admitting: Orthotics

## 2018-08-15 DIAGNOSIS — M2142 Flat foot [pes planus] (acquired), left foot: Secondary | ICD-10-CM

## 2018-08-15 DIAGNOSIS — M2141 Flat foot [pes planus] (acquired), right foot: Secondary | ICD-10-CM

## 2018-08-15 DIAGNOSIS — M722 Plantar fascial fibromatosis: Secondary | ICD-10-CM

## 2018-08-15 DIAGNOSIS — M21622 Bunionette of left foot: Secondary | ICD-10-CM

## 2018-08-15 DIAGNOSIS — L600 Ingrowing nail: Secondary | ICD-10-CM

## 2018-08-15 NOTE — Progress Notes (Signed)

## 2018-08-28 ENCOUNTER — Ambulatory Visit: Payer: BC Managed Care – PPO

## 2018-08-28 DIAGNOSIS — L600 Ingrowing nail: Secondary | ICD-10-CM

## 2018-08-28 NOTE — Patient Instructions (Signed)

## 2018-09-01 NOTE — Progress Notes (Signed)
Patient is here today for follow-up appointment, recent procedure performed on removal of ingrown bilateral hallux ingrown toenails.  Procedure performed on 08/14/2018.  She states that overall she is not having any issues with healing and she is not having pain at this time.  Left hallux nail border scabbed over and healing well.  Right hallux nail border with mild blister possible phenol reaction, but there is no redness, no swelling, no erythema, and no drainage.  Overall both areas appear to be healing well at this time.  Discussed signs and symptoms of infection, verbal and written instructions were given to the patient.  She is to follow-up as needed with any acute symptom changes.

## 2018-09-10 ENCOUNTER — Other Ambulatory Visit: Payer: BC Managed Care – PPO | Admitting: Orthotics

## 2018-09-15 ENCOUNTER — Other Ambulatory Visit: Payer: BC Managed Care – PPO | Admitting: Orthotics

## 2019-01-03 ENCOUNTER — Other Ambulatory Visit: Payer: Self-pay | Admitting: *Deleted

## 2019-01-03 DIAGNOSIS — Z20822 Contact with and (suspected) exposure to covid-19: Secondary | ICD-10-CM

## 2019-01-09 LAB — NOVEL CORONAVIRUS, NAA: SARS-CoV-2, NAA: NOT DETECTED

## 2019-02-10 ENCOUNTER — Encounter (HOSPITAL_BASED_OUTPATIENT_CLINIC_OR_DEPARTMENT_OTHER): Payer: Self-pay | Admitting: Emergency Medicine

## 2019-02-10 ENCOUNTER — Other Ambulatory Visit: Payer: Self-pay

## 2019-02-10 ENCOUNTER — Emergency Department (HOSPITAL_BASED_OUTPATIENT_CLINIC_OR_DEPARTMENT_OTHER)
Admission: EM | Admit: 2019-02-10 | Discharge: 2019-02-10 | Disposition: A | Discharge status: Home / Self Care | Payer: BC Managed Care – PPO | Attending: Emergency Medicine | Admitting: Emergency Medicine

## 2019-02-10 DIAGNOSIS — Z7982 Long term (current) use of aspirin: Secondary | ICD-10-CM | POA: Insufficient documentation

## 2019-02-10 DIAGNOSIS — I10 Essential (primary) hypertension: Secondary | ICD-10-CM | POA: Insufficient documentation

## 2019-02-10 DIAGNOSIS — Z79899 Other long term (current) drug therapy: Secondary | ICD-10-CM | POA: Insufficient documentation

## 2019-02-10 DIAGNOSIS — H8110 Benign paroxysmal vertigo, unspecified ear: Secondary | ICD-10-CM

## 2019-02-10 DIAGNOSIS — R42 Dizziness and giddiness: Secondary | ICD-10-CM | POA: Diagnosis not present

## 2019-02-10 HISTORY — DX: Essential (primary) hypertension: I10

## 2019-02-10 LAB — COMPREHENSIVE METABOLIC PANEL
ALT: 19 U/L (ref 0–44)
AST: 18 U/L (ref 15–41)
Albumin: 4.4 g/dL (ref 3.5–5.0)
Alkaline Phosphatase: 84 U/L (ref 38–126)
Anion gap: 10 (ref 5–15)
BUN: 16 mg/dL (ref 6–20)
CO2: 26 mmol/L (ref 22–32)
Calcium: 9.9 mg/dL (ref 8.9–10.3)
Chloride: 105 mmol/L (ref 98–111)
Creatinine, Ser: 1.11 mg/dL — ABNORMAL HIGH (ref 0.44–1.00)
GFR calc Af Amer: >60 mL/min (ref >60)
GFR calc non Af Amer: 56 mL/min — ABNORMAL LOW (ref >60)
Glucose, Bld: 147 mg/dL — ABNORMAL HIGH (ref 70–99)
Potassium: 4.1 mmol/L (ref 3.5–5.1)
Sodium: 141 mmol/L (ref 135–145)
Total Bilirubin: 0.9 mg/dL (ref 0.3–1.2)
Total Protein: 8.4 g/dL — ABNORMAL HIGH (ref 6.5–8.1)

## 2019-02-10 LAB — CBC WITH DIFFERENTIAL/PLATELET
Abs Immature Granulocytes: 0.08 10*3/uL — ABNORMAL HIGH (ref 0.00–0.07)
Basophils Absolute: 0.1 10*3/uL (ref 0.0–0.1)
Basophils Relative: 0 %
Eosinophils Absolute: 0 10*3/uL (ref 0.0–0.5)
Eosinophils Relative: 0 %
HCT: 46.5 % — ABNORMAL HIGH (ref 36.0–46.0)
Hemoglobin: 15 g/dL (ref 12.0–15.0)
Immature Granulocytes: 1 %
Lymphocytes Relative: 11 %
Lymphs Abs: 1.3 10*3/uL (ref 0.7–4.0)
MCH: 29.2 pg (ref 26.0–34.0)
MCHC: 32.3 g/dL (ref 30.0–36.0)
MCV: 90.6 fL (ref 80.0–100.0)
Monocytes Absolute: 0.5 10*3/uL (ref 0.1–1.0)
Monocytes Relative: 4 %
Neutro Abs: 10.1 10*3/uL — ABNORMAL HIGH (ref 1.7–7.7)
Neutrophils Relative %: 84 %
Platelets: 317 10*3/uL (ref 150–400)
RBC: 5.13 MIL/uL — ABNORMAL HIGH (ref 3.87–5.11)
RDW: 13 % (ref 11.5–15.5)
WBC: 12.1 10*3/uL — ABNORMAL HIGH (ref 4.0–10.5)
nRBC: 0 % (ref 0.0–0.2)

## 2019-02-10 LAB — TROPONIN I (HIGH SENSITIVITY): Troponin I (High Sensitivity): 2 ng/L (ref <18)

## 2019-02-10 MED ORDER — ONDANSETRON HCL 4 MG PO TABS: 4.0000 mg | ORAL_TABLET | Freq: Four times a day (QID) | ORAL | 0 refills | Status: DC

## 2019-02-10 MED ORDER — PROMETHAZINE HCL 25 MG/ML IJ SOLN
12.5000 mg | Freq: Once | INTRAMUSCULAR | Status: AC
  Administered 2019-02-10: 12.5 mg via INTRAVENOUS
  Filled 2019-02-10: qty 1

## 2019-02-10 MED ORDER — DIAZEPAM 2 MG PO TABS
2.0000 mg | ORAL_TABLET | Freq: Once | ORAL | Status: AC
  Administered 2019-02-10: 2 mg via ORAL
  Filled 2019-02-10: qty 1

## 2019-02-10 MED ORDER — ONDANSETRON HCL 4 MG/2ML IJ SOLN
4.0000 mg | Freq: Once | INTRAMUSCULAR | Status: AC
  Administered 2019-02-10: 4 mg via INTRAVENOUS
  Filled 2019-02-10: qty 2

## 2019-02-10 MED ORDER — MECLIZINE HCL 25 MG PO TABS
25.0000 mg | ORAL_TABLET | Freq: Once | ORAL | Status: AC
  Administered 2019-02-10: 25 mg via ORAL
  Filled 2019-02-10: qty 1

## 2019-02-10 MED ORDER — MECLIZINE HCL 25 MG PO TABS: 25.0000 mg | ORAL_TABLET | Freq: Three times a day (TID) | ORAL | 0 refills | Status: DC | PRN

## 2019-02-10 MED ORDER — SODIUM CHLORIDE 0.9 % IV BOLUS
1000.0000 mL | Freq: Once | INTRAVENOUS | Status: AC
  Administered 2019-02-10: 1000 mL via INTRAVENOUS

## 2019-02-10 MED FILL — MECLIZINE 25 MG TABLET: 25 | 10 days supply | Qty: 30 | Fill #0

## 2019-02-10 MED FILL — ONDANSETRON HCL 4 MG TABLET: 4 | 3 days supply | Qty: 12 | Fill #0

## 2019-02-10 NOTE — ED Notes (Signed)
Pt sts she is feeling better now

## 2019-02-10 NOTE — ED Triage Notes (Signed)
Pt c/o dizziness, NV since yesterday morning; sts dizziness started first; feels like room is spinning and worse when sitting up; moving around brings on vomiting

## 2019-02-10 NOTE — ED Provider Notes (Signed)
[facility] EMERGENCY DEPARTMENT Provider Note   CSN: [id number] Arrival date & time: 02/10/19  0906     History   Chief Complaint Chief Complaint  Patient presents with  . Dizziness  . Emesis    HPI Deborah Chaney is a [age] y.o. female.  Presents emergency department with a chief complaint of severe dizziness.  Patient states onset since yesterday morning and has been persistent throughout this time.  States it gets worse when she makes sudden movements, states up.  Sudden movements has brought on vomiting.  All episodes of emesis have been nonbloody nonbilious.  Has not had associated vision changes, numbness, weakness.  Denies ear pain, recent illness, fever.  Has past medical history hypertension, no stroke history.     HPI  Past Medical History:  Diagnosis Date  . Diverticulitis   . Hypertension   . Ovarian cyst     Patient Active Problem List   Diagnosis Date Noted  . Breast lump 02/21/2017  . Cyst of ovary 02/21/2017  . Lower abdominal pain 02/21/2017  . Obesity 02/21/2017  . Upper respiratory infection 02/21/2017    Past Surgical History:  Procedure Laterality Date  . ABDOMINAL HYSTERECTOMY    . APPENDECTOMY    . LAPAROSCOPIC OVARIAN CYSTECTOMY    . TUBAL LIGATION       OB History   No obstetric history on file.      Home Medications    Prior to Admission medications   Medication Sig Start Date End Date Taking? Authorizing Provider  ergocalciferol (VITAMIN D2) 1.25 MG (50000 UT) capsule Take 50,000 Units by mouth once a week.   Yes [provider]  amLODipine (NORVASC) 5 MG tablet Take 5 mg by mouth daily.    [provider]  Ascorbic Acid (VITAMIN C) 100 MG tablet Take 100 mg by mouth daily.    [provider]  aspirin 81 MG tablet Take 81 mg by mouth daily.    [provider]  benzonatate (TESSALON) 100 MG capsule Take 1 capsule (100 mg total) by mouth 3 (three) times daily as needed for cough.  05/23/18   Shaune PollackIsaacs, Cameron, MD  calcium carbonate (OS-CAL - DOSED IN MG OF ELEMENTAL CALCIUM) 1250 MG tablet Take 1 tablet by mouth daily.    [provider]  Calcium Carbonate-Vit D-Min (CALCIUM 1200 PO) Take by mouth.    [provider]  cholecalciferol (VITAMIN D) 1000 UNITS tablet Take 1,000 Units by mouth daily.    [provider]  diphenhydrAMINE (BENADRYL) 25 MG tablet Take 25 mg by mouth every 6 (six) hours as needed for itching. Reported on 06/16/2015    [provider]  meloxicam (MOBIC) 15 MG tablet Take 1 tablet (15 mg total) by mouth daily. 10/01/13   Hyatt, Max T, DPM  Multiple Vitamin (MULTIVITAMIN) tablet Take 1 tablet by mouth daily. Reported on 06/16/2015    [provider]  NEOMYCIN-POLYMYXIN-HYDROCORTISONE (CORTISPORIN) 1 % SOLN OTIC solution Apply 1-2 drops to toe BID after soaking 08/14/18   Hyatt, Max T, DPM  ondansetron (ZOFRAN ODT) 4 MG disintegrating tablet Take 1 tablet (4 mg total) by mouth every 8 (eight) hours as needed for nausea or vomiting. 05/23/18   Shaune PollackIsaacs, Cameron, MD  oxyCODONE-acetaminophen (PERCOCET) 10-325 MG tablet Take 1 tablet by mouth every 6 (six) hours as needed for pain. 11/02/15   Hyatt, Max T, DPM  promethazine (PHENERGAN) 25 MG tablet Take 1 tablet (25 mg total) by mouth every 8 (  eight) hours as needed for nausea or vomiting. 11/02/15   Hyatt, Max T, DPM    Family History Family History  Problem Relation Age of Onset  . Hypertension Mother   . Diabetes Mother   . Hypertension Father   . Diabetes Father     Social History Social History   Tobacco Use  . Smoking status: Never Smoker  . Smokeless tobacco: Never Used  Substance Use Topics  . Alcohol use: No  . Drug use: No     Allergies   Penicillins   Review of Systems Review of Systems  Constitutional: Negative for chills and fever.  HENT: Negative for ear pain and sore throat.   Eyes: Negative for pain and visual disturbance.   Respiratory: Negative for cough and shortness of breath.   Cardiovascular: Negative for chest pain and palpitations.  Gastrointestinal: Positive for vomiting. Negative for abdominal pain.  Genitourinary: Negative for dysuria and hematuria.  Musculoskeletal: Negative for arthralgias and back pain.  Skin: Negative for color change and rash.  Neurological: Positive for dizziness. Negative for seizures and syncope.  All other systems reviewed and are negative.    Physical Exam Updated Vital Signs BP 138/67 (BP Location: Right Arm)   Pulse 94   Temp 98.4 F (36.9 C) (Oral)   Resp (!) 22   Ht 5' 3.5" (1.613 m)   Wt 107.5 kg   SpO2 97%   BMI 41.32 kg/m   Physical Exam Vitals signs and nursing note reviewed.  Constitutional:      General: She is not in acute distress.    Appearance: She is well-developed.  HENT:     Head: Normocephalic and atraumatic.  Eyes:     Conjunctiva/sclera: Conjunctivae normal.  Neck:     Musculoskeletal: Neck supple.  Cardiovascular:     Rate and Rhythm: Normal rate and regular rhythm.     Heart sounds: No murmur.  Pulmonary:     Effort: Pulmonary effort is normal. No respiratory distress.     Breath sounds: Normal breath sounds.  Abdominal:     Palpations: Abdomen is soft.     Tenderness: There is no abdominal tenderness.  Skin:    General: Skin is warm and dry.  Neurological:     Mental Status: She is alert.     Comments: Alert oriented x3, speech clear, cranial nerves II through XII intact, 5 out of 5 strength in upper and lower extremities, head impulse with reassuring catch-up saccade, no vertical nystagmus, fatigable right nystagmus horizontal, negative test of skew      ED Treatments / Results  Labs (all labs ordered are listed, but only abnormal results are displayed) Labs Reviewed  COMPREHENSIVE METABOLIC PANEL  CBC WITH DIFFERENTIAL/PLATELET    EKG None  Radiology No results found.  Procedures Procedures (including  critical care time)  Medications Ordered in ED Medications  meclizine (ANTIVERT) tablet 25 mg (has no administration in time range)  ondansetron (ZOFRAN) injection 4 mg (4 mg Intravenous Given 02/10/19 0959)  sodium chloride 0.9 % bolus 1,000 mL (1,000 mLs Intravenous New Bag/Given 02/10/19 0959)     Initial Impression / Assessment and Plan / ED Course  I have reviewed the triage vital signs and the nursing notes.  Pertinent labs & imaging results that were available during my care of the patient were reviewed by me and considered in my medical decision making (see chart for details).        53 year old lady presents with presented with chief  complaint of vertigo.  On exam patient was noted to be well-appearing, had normal neurologic exam including a normal and reassuring hints exam.  Symptoms were positional.  I suspect patient has BPPV.  And have very low suspicion for central cause of patient's vertigo.  Patient was treated symptomatically, patient able to tolerate p.o. and had near complete resolution of her symptoms believe appropriate for outpatient management this time.  Recommend recheck with primary doctor, reviewed precautions in detail, will discharge home.    After the discussed management above, the patient was determined to be safe for discharge.  The patient was in agreement with this plan and all questions regarding their care were answered.  ED return precautions were discussed and the patient will return to the ED with any significant worsening of condition.    Final Clinical Impressions(s) / ED Diagnoses   Final diagnoses:  Vertigo  Benign paroxysmal positional vertigo, unspecified laterality    ED Discharge Orders    None       Milagros Lollykstra, Hicks Feick S, MD 02/10/19 [phone]

## 2019-02-10 NOTE — Discharge Instructions (Signed)
Recommended in the nausea and vertigo medicine as instructed.  If you feel your symptoms worsen, you develop any numbness, weakness, vision changes, please return immediately to the emergency department for reassessment.  Recommend recheck with your primary doctor later this week.

## 2019-05-25 ENCOUNTER — Other Ambulatory Visit: Payer: Self-pay | Admitting: Family Medicine

## 2019-05-25 ENCOUNTER — Other Ambulatory Visit (HOSPITAL_COMMUNITY)
Admission: RE | Admit: 2019-05-25 | Discharge: 2019-05-25 | Disposition: A | Discharge status: Home / Self Care | Payer: BC Managed Care – PPO | Source: Ambulatory Visit | Attending: Family Medicine | Admitting: Family Medicine

## 2019-05-25 DIAGNOSIS — Z0000 Encounter for general adult medical examination without abnormal findings: Secondary | ICD-10-CM | POA: Diagnosis not present

## 2019-05-31 LAB — CYTOLOGY - PAP
Comment: NEGATIVE
Diagnosis: UNDETERMINED — AB
High risk HPV: NEGATIVE

## 2020-05-02 ENCOUNTER — Emergency Department (HOSPITAL_BASED_OUTPATIENT_CLINIC_OR_DEPARTMENT_OTHER): Payer: BC Managed Care – PPO

## 2020-05-02 ENCOUNTER — Emergency Department (HOSPITAL_BASED_OUTPATIENT_CLINIC_OR_DEPARTMENT_OTHER)
Admission: EM | Admit: 2020-05-02 | Discharge: 2020-05-02 | Disposition: A | Discharge status: Home / Self Care | Payer: BC Managed Care – PPO | Attending: Emergency Medicine | Admitting: Emergency Medicine

## 2020-05-02 ENCOUNTER — Encounter (HOSPITAL_BASED_OUTPATIENT_CLINIC_OR_DEPARTMENT_OTHER): Payer: Self-pay

## 2020-05-02 ENCOUNTER — Other Ambulatory Visit: Payer: Self-pay

## 2020-05-02 DIAGNOSIS — Z8742 Personal history of other diseases of the female genital tract: Secondary | ICD-10-CM | POA: Diagnosis not present

## 2020-05-02 DIAGNOSIS — K5732 Diverticulitis of large intestine without perforation or abscess without bleeding: Secondary | ICD-10-CM | POA: Insufficient documentation

## 2020-05-02 DIAGNOSIS — Z79899 Other long term (current) drug therapy: Secondary | ICD-10-CM | POA: Diagnosis not present

## 2020-05-02 DIAGNOSIS — R109 Unspecified abdominal pain: Secondary | ICD-10-CM | POA: Diagnosis present

## 2020-05-02 DIAGNOSIS — I10 Essential (primary) hypertension: Secondary | ICD-10-CM | POA: Diagnosis not present

## 2020-05-02 DIAGNOSIS — Z9851 Tubal ligation status: Secondary | ICD-10-CM | POA: Insufficient documentation

## 2020-05-02 DIAGNOSIS — Z7982 Long term (current) use of aspirin: Secondary | ICD-10-CM | POA: Diagnosis not present

## 2020-05-02 LAB — COMPREHENSIVE METABOLIC PANEL
ALT: 20 U/L (ref 0–44)
AST: 16 U/L (ref 15–41)
Albumin: 4 g/dL (ref 3.5–5.0)
Alkaline Phosphatase: 82 U/L (ref 38–126)
Anion gap: 11 (ref 5–15)
BUN: 10 mg/dL (ref 6–20)
CO2: 24 mmol/L (ref 22–32)
Calcium: 9.3 mg/dL (ref 8.9–10.3)
Chloride: 102 mmol/L (ref 98–111)
Creatinine, Ser: 0.97 mg/dL (ref 0.44–1.00)
GFR, Estimated: >60 mL/min (ref >60)
Glucose, Bld: 110 mg/dL — ABNORMAL HIGH (ref 70–99)
Potassium: 3.8 mmol/L (ref 3.5–5.1)
Sodium: 137 mmol/L (ref 135–145)
Total Bilirubin: 0.8 mg/dL (ref 0.3–1.2)
Total Protein: 7.9 g/dL (ref 6.5–8.1)

## 2020-05-02 LAB — URINALYSIS, ROUTINE W REFLEX MICROSCOPIC
Bilirubin Urine: NEGATIVE
Glucose, UA: NEGATIVE mg/dL
Ketones, ur: NEGATIVE mg/dL
Leukocytes,Ua: NEGATIVE
Nitrite: NEGATIVE
Protein, ur: NEGATIVE mg/dL
Specific Gravity, Urine: 1.02 (ref 1.005–1.030)
pH: 6 (ref 5.0–8.0)

## 2020-05-02 LAB — LIPASE, BLOOD: Lipase: 16 U/L (ref 11–51)

## 2020-05-02 LAB — CBC WITH DIFFERENTIAL/PLATELET
Abs Immature Granulocytes: 0.05 10*3/uL (ref 0.00–0.07)
Basophils Absolute: 0.1 10*3/uL (ref 0.0–0.1)
Basophils Relative: 0 %
Eosinophils Absolute: 0 10*3/uL (ref 0.0–0.5)
Eosinophils Relative: 0 %
HCT: 42.2 % (ref 36.0–46.0)
Hemoglobin: 14.1 g/dL (ref 12.0–15.0)
Immature Granulocytes: 0 %
Lymphocytes Relative: 12 %
Lymphs Abs: 1.6 10*3/uL (ref 0.7–4.0)
MCH: 29.4 pg (ref 26.0–34.0)
MCHC: 33.4 g/dL (ref 30.0–36.0)
MCV: 87.9 fL (ref 80.0–100.0)
Monocytes Absolute: 0.9 10*3/uL (ref 0.1–1.0)
Monocytes Relative: 7 %
Neutro Abs: 10.9 10*3/uL — ABNORMAL HIGH (ref 1.7–7.7)
Neutrophils Relative %: 81 %
Platelets: 312 10*3/uL (ref 150–400)
RBC: 4.8 MIL/uL (ref 3.87–5.11)
RDW: 13 % (ref 11.5–15.5)
WBC: 13.5 10*3/uL — ABNORMAL HIGH (ref 4.0–10.5)
nRBC: 0 % (ref 0.0–0.2)

## 2020-05-02 LAB — URINALYSIS, MICROSCOPIC (REFLEX)

## 2020-05-02 MED ORDER — CIPROFLOXACIN HCL 500 MG PO TABS: 500.0000 mg | ORAL_TABLET | Freq: Two times a day (BID) | ORAL | 0 refills | Status: DC

## 2020-05-02 MED ORDER — ONDANSETRON HCL 4 MG/2ML IJ SOLN
4.0000 mg | Freq: Once | INTRAMUSCULAR | Status: AC
  Administered 2020-05-02: 4 mg via INTRAVENOUS
  Filled 2020-05-02: qty 2

## 2020-05-02 MED ORDER — CIPROFLOXACIN HCL 500 MG PO TABS
500.0000 mg | ORAL_TABLET | Freq: Once | ORAL | Status: AC
  Administered 2020-05-02: 500 mg via ORAL
  Filled 2020-05-02: qty 1

## 2020-05-02 MED ORDER — MORPHINE SULFATE (PF) 2 MG/ML IV SOLN
2.0000 mg | Freq: Once | INTRAVENOUS | Status: AC
  Administered 2020-05-02: 2 mg via INTRAVENOUS
  Filled 2020-05-02: qty 1

## 2020-05-02 MED ORDER — SODIUM CHLORIDE 0.9 % IV BOLUS
1000.0000 mL | Freq: Once | INTRAVENOUS | Status: AC
  Administered 2020-05-02: 1000 mL via INTRAVENOUS

## 2020-05-02 MED ORDER — ONDANSETRON 4 MG PO TBDP: 4.0000 mg | ORAL_TABLET | Freq: Three times a day (TID) | ORAL | 0 refills | Status: DC | PRN

## 2020-05-02 MED ORDER — METRONIDAZOLE 500 MG PO TABS: 500.0000 mg | ORAL_TABLET | Freq: Two times a day (BID) | ORAL | 0 refills | Status: AC

## 2020-05-02 MED ORDER — IOHEXOL 300 MG/ML  SOLN
100.0000 mL | Freq: Once | INTRAMUSCULAR | Status: AC | PRN
  Administered 2020-05-02: 100 mL via INTRAVENOUS

## 2020-05-02 MED ORDER — METRONIDAZOLE 500 MG PO TABS
500.0000 mg | ORAL_TABLET | Freq: Once | ORAL | Status: AC
  Administered 2020-05-02: 500 mg via ORAL
  Filled 2020-05-02: qty 1

## 2020-05-02 NOTE — ED Provider Notes (Signed)
[facility] EMERGENCY DEPARTMENT Provider Note   CSN: [id number] Arrival date & time: 05/02/20  0944     History No chief complaint on file.   Deborah Chaney is a 54 y.o. female with PMH of diverticulitis who presents the ED with complaints of abdominal pain, nausea and low-grade fever 100.1 F.  On my exam, patient states that for the past week she has been feeling mildly ill with low-grade fevers and body aches.  She has also been endorsing chills.  Yesterday upon waking she developed crampy lower abdominal pain.  She states that it feels similar to her prior history of diverticulitis.  She has been managing her symptoms with Tylenol and ibuprofen.  She states that she has not had anything to eat since 2 PM yesterday given her nausea symptoms.  She states that this feels comparable to her prior history of diverticulitis, which has required hospitalization in the past.  She denies any chest pain, shortness of breath, inability to tolerate food or liquid by mouth, melena or hematochezia, other changes in her bowel habits, or other symptoms.   HPI     Past Medical History:  Diagnosis Date  . Diverticulitis   . Hypertension   . Ovarian cyst     Patient Active Problem List   Diagnosis Date Noted  . Breast lump 02/21/2017  . Cyst of ovary 02/21/2017  . Lower abdominal pain 02/21/2017  . Obesity 02/21/2017  . Upper respiratory infection 02/21/2017    Past Surgical History:  Procedure Laterality Date  . ABDOMINAL HYSTERECTOMY    . APPENDECTOMY    . LAPAROSCOPIC OVARIAN CYSTECTOMY    . TUBAL LIGATION       OB History   No obstetric history on file.     Family History  Problem Relation Age of Onset  . Hypertension Mother   . Diabetes Mother   . Hypertension Father   . Diabetes Father     Social History   Tobacco Use  . Smoking status: Never Smoker  . Smokeless tobacco: Never Used  Vaping Use  . Vaping Use: Never used  Substance Use Topics  .  Alcohol use: No  . Drug use: No    Home Medications Prior to Admission medications   Medication Sig Start Date End Date Taking? Authorizing Provider  amLODipine (NORVASC) 5 MG tablet Take 5 mg by mouth daily.    [provider]  Ascorbic Acid (VITAMIN C) 100 MG tablet Take 100 mg by mouth daily.    [provider]  aspirin 81 MG tablet Take 81 mg by mouth daily.    [provider]  benzonatate (TESSALON) 100 MG capsule Take 1 capsule (100 mg total) by mouth 3 (three) times daily as needed for cough. 05/23/18   Shaune Pollack, MD  calcium carbonate (OS-CAL - DOSED IN MG OF ELEMENTAL CALCIUM) 1250 MG tablet Take 1 tablet by mouth daily.    [provider]  Calcium Carbonate-Vit D-Min (CALCIUM 1200 PO) Take by mouth.    [provider]  cholecalciferol (VITAMIN D) 1000 UNITS tablet Take 1,000 Units by mouth daily.    [provider]  ciprofloxacin (CIPRO) 500 MG tablet Take 1 tablet (500 mg total) by mouth 2 (two) times daily. 05/02/20   Lorelee New, PA-C  diphenhydrAMINE (BENADRYL) 25 MG tablet Take 25 mg by mouth every 6 (six) hours as needed for itching. Reported on 06/16/2015    [provider]  ergocalciferol (VITAMIN D2) 1.25  MG (50000 UT) capsule Take 50,000 Units by mouth once a week.    [provider]  meclizine (ANTIVERT) 25 MG tablet Take 1 tablet (25 mg total) by mouth 3 (three) times daily as needed for dizziness. 02/10/19   Milagros Loll, MD  meloxicam (MOBIC) 15 MG tablet Take 1 tablet (15 mg total) by mouth daily. 10/01/13   Hyatt, Max T, DPM  metroNIDAZOLE (FLAGYL) 500 MG tablet Take 1 tablet (500 mg total) by mouth 2 (two) times daily for 10 days. 05/02/20 05/12/20  Lorelee New, PA-C  Multiple Vitamin (MULTIVITAMIN) tablet Take 1 tablet by mouth daily. Reported on 06/16/2015    [provider]  NEOMYCIN-POLYMYXIN-HYDROCORTISONE (CORTISPORIN) 1 % SOLN OTIC solution Apply 1-2 drops to toe  BID after soaking 08/14/18   Hyatt, Max T, DPM  ondansetron (ZOFRAN ODT) 4 MG disintegrating tablet Take 1 tablet (4 mg total) by mouth every 8 (eight) hours as needed for nausea or vomiting. 05/02/20   Lorelee New, PA-C  ondansetron (ZOFRAN) 4 MG tablet Take 1 tablet (4 mg total) by mouth every 6 (six) hours. 02/10/19   Milagros Loll, MD  oxyCODONE-acetaminophen (PERCOCET) 10-325 MG tablet Take 1 tablet by mouth every 6 (six) hours as needed for pain. 11/02/15   Hyatt, Max T, DPM  promethazine (PHENERGAN) 25 MG tablet Take 1 tablet (25 mg total) by mouth every 8 (eight) hours as needed for nausea or vomiting. 11/02/15   Hyatt, Max T, DPM    Allergies    Penicillins  Review of Systems   Review of Systems  All other systems reviewed and are negative.   Physical Exam Updated Vital Signs BP 131/77 (BP Location: Right Arm)   Pulse 97   Temp 98.5 F (36.9 C) (Oral)   Resp 14   Ht 5\' 4"  (1.626 m)   Wt 109.8 kg   SpO2 100%   BMI 41.54 kg/m   Physical Exam Vitals and nursing note reviewed. Exam conducted with a chaperone present.  HENT:     Head: Normocephalic and atraumatic.  Eyes:     General: No scleral icterus.    Conjunctiva/sclera: Conjunctivae normal.  Cardiovascular:     Rate and Rhythm: Normal rate and regular rhythm.     Pulses: Normal pulses.     Heart sounds: Normal heart sounds.  Pulmonary:     Effort: Pulmonary effort is normal. No respiratory distress.     Breath sounds: Normal breath sounds.  Abdominal:     General: Abdomen is flat. There is no distension.     Palpations: Abdomen is soft.     Tenderness: There is abdominal tenderness. There is no guarding.     Comments: Significant TTP in LLQ and suprapubic region.  No significant TTP elsewhere.  No guarding.  Soft.  Musculoskeletal:     Right lower leg: No edema.     Left lower leg: No edema.  Skin:    General: Skin is dry.     Capillary Refill: Capillary refill takes less than 2 seconds.   Neurological:     Mental Status: She is alert and oriented to person, place, and time.     GCS: GCS eye subscore is 4. GCS verbal subscore is 5. GCS motor subscore is 6.  Psychiatric:        Mood and Affect: Mood normal.        Behavior: Behavior normal.        Thought Content: Thought content normal.  ED Results / Procedures / Treatments   Labs (all labs ordered are listed, but only abnormal results are displayed) Labs Reviewed  COMPREHENSIVE METABOLIC PANEL - Abnormal; Notable for the following components:      Result Value   Glucose, Bld 110 (*)    All other components within normal limits  CBC WITH DIFFERENTIAL/PLATELET - Abnormal; Notable for the following components:   WBC 13.5 (*)    Neutro Abs 10.9 (*)    All other components within normal limits  URINALYSIS, ROUTINE W REFLEX MICROSCOPIC - Abnormal; Notable for the following components:   Hgb urine dipstick TRACE (*)    All other components within normal limits  URINALYSIS, MICROSCOPIC (REFLEX) - Abnormal; Notable for the following components:   Bacteria, UA MANY (*)    All other components within normal limits  LIPASE, BLOOD    EKG None  Radiology CT ABDOMEN PELVIS W CONTRAST  Result Date: 05/02/2020 CLINICAL DATA:  Abdominal pain and nausea EXAM: CT ABDOMEN AND PELVIS WITH CONTRAST TECHNIQUE: Multidetector CT imaging of the abdomen and pelvis was performed using the standard protocol following bolus administration of intravenous contrast. CONTRAST:  OMNIPAQUE IOHEXOL 300 MG/ML  SOLN COMPARISON:  September 24, 2016. FINDINGS: Lower chest: Lung bases are clear. Hepatobiliary: There is fatty infiltration near the fissure for the ligamentum teres. No focal liver lesions are appreciable. Gallbladder wall is not appreciably thickened. There is no biliary duct dilatation. Pancreas: There is no pancreatic mass or inflammatory focus. Spleen: There is a 6 mm mass in the dome of the spleen, likely either a cyst or small  hemangioma. No other splenic lesions are evident. Adrenals/Urinary Tract: Adrenals bilaterally appear normal. Kidneys bilaterally show no evident mass or hydronephrosis on either side. There is no appreciable renal or ureteral calculus on either side. Urinary bladder is midline. Wall thickening along the anterior urinary bladder may be due to nearby diverticulitis. Stomach/Bowel: There is wall thickening in the mid sigmoid colon region with irregular diverticula along the medial wall of the mid sigmoid colon. There is associated soft tissue stranding in this area but no fluid or abscess. No perforation evident. Elsewhere, there is no bowel wall thickening or bowel obstruction. The terminal ileum appears normal. There is no appreciable free air or portal venous air. Vascular/Lymphatic: No abdominal aortic aneurysm. No arterial vascular lesions are evident. Major venous structures appear patent. There is no evident adenopathy in the abdomen or pelvis. Reproductive: The uterus is absent.  No adnexal region mass evident. Other: Appendix absent. No periappendiceal region inflammation. No abscess or ascites is evident in the abdomen or pelvis. There is slight fat in the umbilicus. Musculoskeletal: No blastic or lytic bone lesions are evident. No appreciable abdominal wall or intramuscular lesions are evident. IMPRESSION: 1. Evidence of midsigmoid region diverticulitis. No perforation or abscess evident. 2. No bowel obstruction. No abscess in the abdomen or pelvis. Appendix absent. No periappendiceal region inflammation. 3. No appreciable renal or ureteral calculus. No hydronephrosis. Thickening of the urinary bladder wall along its anterior aspect may be due to nearby diverticulitis. Correlation with urinalysis to assess for underlying cystitis may be reasonable. Electronically Signed   By: Bretta Bang III M.D.   On: 05/02/2020 12:25    Procedures Procedures (including critical care time)  Medications Ordered  in ED Medications  sodium chloride 0.9 % bolus 1,000 mL ( Intravenous Stopped 05/02/20 1234)  morphine 2 MG/ML injection 2 mg (2 mg Intravenous Given 05/02/20 1117)  ondansetron (ZOFRAN) injection 4  mg (4 mg Intravenous Given 05/02/20 1116)  iohexol (OMNIPAQUE) 300 MG/ML solution 100 mL (100 mLs Intravenous Contrast Given 05/02/20 1155)  ciprofloxacin (CIPRO) tablet 500 mg (500 mg Oral Given 05/02/20 1318)  metroNIDAZOLE (FLAGYL) tablet 500 mg (500 mg Oral Given 05/02/20 1318)    ED Course  I have reviewed the triage vital signs and the nursing notes.  Pertinent labs & imaging results that were available during my care of the patient were reviewed by me and considered in my medical decision making (see chart for details).    MDM Rules/Calculators/A&P                          Given patient's history and physical exam, concern for diverticulitis.  Will obtain CT imaging to assess for complications including abscess or perforation.  Will obtain basic laboratory work-up.  Patient will be given 1 L IV NS given that she has had multiple diminished ability to eat and drink due to nausea and abdominal pain.  Will treat with morphine.  Labs CBC with differential: Leukocytosis to 13.5. CMP: No significant derangement.  No obvious liver renal impairment. UA: No evidence of infection. Lipase: Within normal limits.  I personally reviewed CT imaging of the abdomen and pelvis which demonstrates mid sigmoid region diverticulitis without evidence of abscess, perforation, or other complication.  Patient was p.o. challenged successfully here in the ED.  Patient has a penicillin allergy.  Will treat with ciprofloxacin and Flagyl.  Patient voices understanding and is agreeable to the plan.  All of the evaluation and work-up results were discussed with the patient and any family at bedside.  Patient and/or family were informed that while patient is appropriate for discharge at this time, some medical emergencies  may only develop or become detectable after a period of time.  I specifically instructed patient and/or family to return to return to the ED or seek immediate medical attention for any new or worsening symptoms.  They were provided opportunity to ask any additional questions and have none at this time.  Prior to discharge patient is feeling well, agreeable with plan for discharge home.  They have expressed understanding of verbal discharge instructions as well as return precautions and are agreeable to the plan.   Patient counseled to never drive or operate heavy machinery while taking narcotic or other sedating medication.  Final Clinical Impression(s) / ED Diagnoses Final diagnoses:  Sigmoid diverticulitis    Rx / DC Orders ED Discharge Orders         Ordered    ciprofloxacin (CIPRO) 500 MG tablet  2 times daily        05/02/20 1336    metroNIDAZOLE (FLAGYL) 500 MG tablet  2 times daily        05/02/20 1336    ondansetron (ZOFRAN ODT) 4 MG disintegrating tablet  Every 8 hours PRN        05/02/20 1342           Lorelee NewGreen, Ronalda Walpole L, PA-C 05/02/20 1343    Terrilee FilesButler, Michael C, MD 05/02/20 1859

## 2020-05-02 NOTE — Discharge Instructions (Addendum)
Please read the attachment on diverticulitis.  I would like for you to follow-up with your primary care provider regarding today's encounter.  Please take the antibiotics, as directed.  I have also prescribed you Zofran which I would like for you to take as needed for nausea symptoms.  I would like for you to take Tylenol or NSAIDs as needed for pain symptoms.  Your symptoms of abdominal discomfort should improve with treatment of the infection.  Return to the ED or seek immediate medical attention should you experience any new or worsening symptoms.  You were given narcotic and or sedative medications while in the emergency department. Do not drive. Do not use machinery or power tools. Do not sign legal documents. Do not drink alcohol. Do not take sleeping pills. Do not supervise children by yourself. Do not participate in activities that require climbing or being in high places.

## 2020-05-02 NOTE — ED Notes (Signed)
Patient transported to CT 

## 2020-05-02 NOTE — ED Triage Notes (Signed)
Per pt, I have had nausea since yesterday, I had chills last night, pt complaint of fever 100.1.

## 2020-05-09 ENCOUNTER — Other Ambulatory Visit: Payer: Self-pay

## 2020-05-09 ENCOUNTER — Ambulatory Visit (INDEPENDENT_AMBULATORY_CARE_PROVIDER_SITE_OTHER): Payer: BC Managed Care – PPO | Admitting: Podiatry

## 2020-05-09 DIAGNOSIS — L989 Disorder of the skin and subcutaneous tissue, unspecified: Secondary | ICD-10-CM

## 2020-05-09 DIAGNOSIS — L600 Ingrowing nail: Secondary | ICD-10-CM

## 2020-05-09 MED ORDER — GENTAMICIN SULFATE 0.1 % EX CREA: 1.0000 "application " | TOPICAL_CREAM | Freq: Two times a day (BID) | CUTANEOUS | 1 refills | Status: DC

## 2020-05-09 NOTE — Progress Notes (Signed)
   Subjective: Patient presents today for evaluation of pain to the medial border of the left great toenail. Patient is concerned for possible ingrown nail.  She does have a history of nail matricectomy's in the past.  She states that the nail continues to regrow.  She is dealt with this issue ever since she was a teenager.  Patient presents today for further treatment and evaluation. Patient also has a symptomatic callus lesion to the plantar aspect of the left forefoot.  Is very symptomatic to walk on and she states that it feels like she is walking on a rock in her shoe.   Past Medical History:  Diagnosis Date  . Diverticulitis   . Hypertension   . Ovarian cyst     Objective:  General: Well developed, nourished, in no acute distress, alert and oriented x3   Dermatology: Skin is warm, dry and supple bilateral.  Medial border left great toe appears to be erythematous with evidence of an ingrowing nail. Pain on palpation noted to the border of the nail fold. The remaining nails appear unremarkable at this time. There are no open sores, lesions. Hyperkeratotic preulcerative callus also noted to the plantar aspect of the left forefoot  Vascular: Dorsalis Pedis artery and Posterior Tibial artery pedal pulses palpable. No lower extremity edema noted.   Neruologic: Grossly intact via light touch bilateral.  Musculoskeletal: Muscular strength within normal limits in all groups bilateral. Normal range of motion noted to all pedal and ankle joints.   Assesement: #1 Paronychia with ingrowing nail medial border left great toe #2 Pain in toe #3  Preulcerative callus lesion left plantar forefoot  Plan of Care:  1. Patient evaluated.  2. Discussed treatment alternatives and plan of care. Explained nail avulsion procedure and post procedure course to patient. 3. Patient opted for permanent partial nail avulsion of the medial border left great toe.  4. Prior to procedure, local anesthesia  infiltration utilized using 3 ml of a 50:50 mixture of 2% plain lidocaine and 0.5% plain marcaine in a normal hallux block fashion and a betadine prep performed.  5. Partial permanent nail avulsion with chemical matrixectomy performed using 3x30sec applications of phenol followed by alcohol flush.  6. Light dressing applied. 7.  Prescription for gentamicin cream  8.  Excisional debridement of the hyperkeratotic callus tissue was performed using a chisel blade without incident or bleeding.  Recommend OTC corn and callus remover  9.  Return to clinic 2 weeks  *Juvenile court counselor  Felecia Shelling, DPM [facility]  Dr. Felecia Shelling, DPM    [address]                                        [place], [place] [address]                Office [phone]  Fax [phone]

## 2020-05-17 ENCOUNTER — Ambulatory Visit: Payer: BC Managed Care – PPO | Admitting: Podiatry

## 2020-05-25 ENCOUNTER — Ambulatory Visit: Payer: BC Managed Care – PPO | Admitting: Podiatry

## 2020-05-28 ENCOUNTER — Other Ambulatory Visit: Payer: Self-pay

## 2020-05-28 DIAGNOSIS — K5732 Diverticulitis of large intestine without perforation or abscess without bleeding: Secondary | ICD-10-CM | POA: Insufficient documentation

## 2020-05-28 DIAGNOSIS — I10 Essential (primary) hypertension: Secondary | ICD-10-CM | POA: Insufficient documentation

## 2020-05-28 DIAGNOSIS — Z7982 Long term (current) use of aspirin: Secondary | ICD-10-CM | POA: Diagnosis not present

## 2020-05-28 DIAGNOSIS — R109 Unspecified abdominal pain: Secondary | ICD-10-CM | POA: Diagnosis present

## 2020-05-28 LAB — CBC
HCT: 42.2 % (ref 36.0–46.0)
Hemoglobin: 14.4 g/dL (ref 12.0–15.0)
MCH: 29.8 pg (ref 26.0–34.0)
MCHC: 34.1 g/dL (ref 30.0–36.0)
MCV: 87.4 fL (ref 80.0–100.0)
Platelets: 325 10*3/uL (ref 150–400)
RBC: 4.83 MIL/uL (ref 3.87–5.11)
RDW: 13 % (ref 11.5–15.5)
WBC: 13.6 10*3/uL — ABNORMAL HIGH (ref 4.0–10.5)
nRBC: 0 % (ref 0.0–0.2)

## 2020-05-28 LAB — COMPREHENSIVE METABOLIC PANEL
ALT: 20 U/L (ref 0–44)
AST: 16 U/L (ref 15–41)
Albumin: 4.2 g/dL (ref 3.5–5.0)
Alkaline Phosphatase: 83 U/L (ref 38–126)
Anion gap: 10 (ref 5–15)
BUN: 12 mg/dL (ref 6–20)
CO2: 21 mmol/L — ABNORMAL LOW (ref 22–32)
Calcium: 9.3 mg/dL (ref 8.9–10.3)
Chloride: 105 mmol/L (ref 98–111)
Creatinine, Ser: 0.95 mg/dL (ref 0.44–1.00)
GFR, Estimated: >60 mL/min (ref >60)
Glucose, Bld: 115 mg/dL — ABNORMAL HIGH (ref 70–99)
Potassium: 4.1 mmol/L (ref 3.5–5.1)
Sodium: 136 mmol/L (ref 135–145)
Total Bilirubin: 0.7 mg/dL (ref 0.3–1.2)
Total Protein: 8.1 g/dL (ref 6.5–8.1)

## 2020-05-28 LAB — URINALYSIS, ROUTINE W REFLEX MICROSCOPIC
Bilirubin Urine: NEGATIVE
Glucose, UA: NEGATIVE mg/dL
Hgb urine dipstick: NEGATIVE
Ketones, ur: NEGATIVE mg/dL
Leukocytes,Ua: NEGATIVE
Nitrite: NEGATIVE
Protein, ur: NEGATIVE mg/dL
Specific Gravity, Urine: 1.01 (ref 1.005–1.030)
pH: 6.5 (ref 5.0–8.0)

## 2020-05-28 LAB — LIPASE, BLOOD: Lipase: 16 U/L (ref 11–51)

## 2020-05-28 NOTE — ED Triage Notes (Signed)
Reports LLQ abdominal pain with nausea and chills since Wednesday.  Denies vomiting or diarrhea.  Temp 100.3 at home.  Had same complaints last month.  Diagnosed with diverticulitis.  Was on flagyl and cipro.  Completed both.  Reports pain is worse with urination.  Denies any radiation around to back.  No other urinary symptoms reported.

## 2020-05-29 ENCOUNTER — Encounter (HOSPITAL_BASED_OUTPATIENT_CLINIC_OR_DEPARTMENT_OTHER): Payer: Self-pay

## 2020-05-29 ENCOUNTER — Emergency Department (HOSPITAL_BASED_OUTPATIENT_CLINIC_OR_DEPARTMENT_OTHER): Payer: BC Managed Care – PPO

## 2020-05-29 ENCOUNTER — Emergency Department (HOSPITAL_BASED_OUTPATIENT_CLINIC_OR_DEPARTMENT_OTHER)
Admission: EM | Admit: 2020-05-29 | Discharge: 2020-05-29 | Disposition: A | Discharge status: Home / Self Care | Payer: BC Managed Care – PPO | Attending: Emergency Medicine | Admitting: Emergency Medicine

## 2020-05-29 DIAGNOSIS — K5732 Diverticulitis of large intestine without perforation or abscess without bleeding: Secondary | ICD-10-CM

## 2020-05-29 MED ORDER — AMOXICILLIN-POT CLAVULANATE 875-125 MG PO TABS: 1.0000 | ORAL_TABLET | Freq: Two times a day (BID) | ORAL | 0 refills | Status: DC

## 2020-05-29 MED ORDER — ONDANSETRON HCL 4 MG/2ML IJ SOLN
4.0000 mg | Freq: Once | INTRAMUSCULAR | Status: AC
  Administered 2020-05-29: 4 mg via INTRAVENOUS
  Filled 2020-05-29: qty 2

## 2020-05-29 MED ORDER — FENTANYL CITRATE (PF) 100 MCG/2ML IJ SOLN
100.0000 ug | Freq: Once | INTRAMUSCULAR | Status: AC
  Administered 2020-05-29: 100 ug via INTRAVENOUS
  Filled 2020-05-29: qty 2

## 2020-05-29 MED ORDER — PIPERACILLIN-TAZOBACTAM 3.375 G IVPB 30 MIN
3.3750 g | Freq: Once | INTRAVENOUS | Status: AC
  Administered 2020-05-29: 3.375 g via INTRAVENOUS
  Filled 2020-05-29: qty 50

## 2020-05-29 MED ORDER — IOHEXOL 300 MG/ML  SOLN
100.0000 mL | Freq: Once | INTRAMUSCULAR | Status: AC | PRN
  Administered 2020-05-29: 100 mL via INTRAVENOUS

## 2020-05-29 MED ORDER — SODIUM CHLORIDE 0.9 % IV SOLN
INTRAVENOUS | Status: DC | PRN
  Administered 2020-05-29: 250 mL via INTRAVENOUS

## 2020-05-29 NOTE — ED Notes (Signed)
Pt tolerated PO challenge; EDP made aware 

## 2020-05-29 NOTE — ED Notes (Signed)
Pt to CT

## 2020-05-29 NOTE — ED Provider Notes (Signed)
[facility] EMERGENCY DEPARTMENT Provider Note   CSN: [id number] Arrival date & time: 05/28/20  2158     History Chief Complaint  Patient presents with  . Abdominal Pain  . Nausea    Deborah Chaney is a 54 y.o. female.  The history is provided by the patient.  Abdominal Pain Pain location:  LLQ Pain quality: aching   Pain radiates to:  Does not radiate Pain severity:  Moderate Onset quality:  Gradual Duration:  4 days Timing:  Intermittent Progression:  Worsening Chronicity:  New Relieved by:  Nothing Worsened by:  Movement, palpation and urination Associated symptoms: chills, constipation and fever   Associated symptoms: no cough, no diarrhea, no hematochezia and no vomiting   Patient reports onset of left lower quadrant abdominal pain approximately 4 days ago. She reports previous history of diverticulitis that responded to antibiotics but this pain feels worse.  She reports fevers and chills.  Temperature was over 100 at home.  It hurts with movement and palpation.  She also reports it hurts with urination    Past Medical History:  Diagnosis Date  . Diverticulitis   . Hypertension   . Ovarian cyst     Patient Active Problem List   Diagnosis Date Noted  . Breast lump 02/21/2017  . Cyst of ovary 02/21/2017  . Lower abdominal pain 02/21/2017  . Obesity 02/21/2017  . Upper respiratory infection 02/21/2017    Past Surgical History:  Procedure Laterality Date  . ABDOMINAL HYSTERECTOMY    . APPENDECTOMY    . LAPAROSCOPIC OVARIAN CYSTECTOMY    . TUBAL LIGATION       OB History   No obstetric history on file.     Family History  Problem Relation Age of Onset  . Hypertension Mother   . Diabetes Mother   . Hypertension Father   . Diabetes Father     Social History   Tobacco Use  . Smoking status: Never Smoker  . Smokeless tobacco: Never Used  Vaping Use  . Vaping Use: Never used  Substance Use Topics  . Alcohol use: No  . Drug  use: No    Home Medications Prior to Admission medications   Medication Sig Start Date End Date Taking? Authorizing Provider  amLODipine (NORVASC) 10 MG tablet Take 10 mg by mouth daily. 02/19/20   [provider]  amLODipine (NORVASC) 5 MG tablet Take 5 mg by mouth daily.    [provider]  Ascorbic Acid (VITAMIN C) 100 MG tablet Take 100 mg by mouth daily.    [provider]  aspirin 81 MG tablet Take 81 mg by mouth daily.    [provider]  benzonatate (TESSALON) 100 MG capsule Take 1 capsule (100 mg total) by mouth 3 (three) times daily as needed for cough. 05/23/18   Shaune Pollack, MD  calcium carbonate (OS-CAL - DOSED IN MG OF ELEMENTAL CALCIUM) 1250 MG tablet Take 1 tablet by mouth daily.    [provider]  Calcium Carbonate-Vit D-Min (CALCIUM 1200 PO) Take by mouth.    [provider]  cholecalciferol (VITAMIN D) 1000 UNITS tablet Take 1,000 Units by mouth daily.    [provider]  ciprofloxacin (CIPRO) 500 MG tablet Take 1 tablet (500 mg total) by mouth 2 (two) times daily. 05/02/20   Lorelee New, PA-C  diphenhydrAMINE (BENADRYL) 25 MG tablet Take 25 mg by mouth every 6 (six) hours as needed for itching. Reported on 06/16/2015  [provider]  diphenhydrAMINE (SOMINEX) 25 MG tablet Take by mouth.    [provider]  doxycycline (VIBRAMYCIN) 100 MG capsule doxycycline hyclate 100 mg capsule    [provider]  ergocalciferol (VITAMIN D2) 1.25 MG (50000 UT) capsule Take 50,000 Units by mouth once a week.    [provider]  fluconazole (DIFLUCAN) 150 MG tablet fluconazole 150 mg tablet  TAKE 1 TABLET BY MOUTH AS A ONE TIME DOSE FOR YEAST MAY REPEAT IN 2 3 DAYS IF NOT IMPROVING    [provider]  gentamicin cream (GARAMYCIN) 0.1 % Apply 1 application topically 2 (two) times daily. 05/09/20   Felecia ShellingEvans, Brent M, DPM  meclizine (ANTIVERT) 25 MG tablet Take 1 tablet (25 mg  total) by mouth 3 (three) times daily as needed for dizziness. 02/10/19   Milagros Lollykstra, Richard S, MD  meloxicam (MOBIC) 15 MG tablet Take 1 tablet (15 mg total) by mouth daily. 10/01/13   Hyatt, Max T, DPM  methylPREDNISolone (MEDROL DOSEPAK) 4 MG TBPK tablet methylprednisolone 4 mg tablets in a dose pack    [provider]  Multiple Vitamin (MULTIVITAMIN) tablet Take 1 tablet by mouth daily. Reported on 06/16/2015    [provider]  NEOMYCIN-POLYMYXIN-HYDROCORTISONE (CORTISPORIN) 1 % SOLN OTIC solution Apply 1-2 drops to toe BID after soaking 08/14/18   Hyatt, Max T, DPM  ondansetron (ZOFRAN ODT) 4 MG disintegrating tablet Take 1 tablet (4 mg total) by mouth every 8 (eight) hours as needed for nausea or vomiting. 05/02/20   Lorelee NewGreen, Garrett L, PA-C  ondansetron (ZOFRAN) 4 MG tablet Take 1 tablet (4 mg total) by mouth every 6 (six) hours. 02/10/19   Milagros Lollykstra, Richard S, MD  oxyCODONE-acetaminophen (PERCOCET) 10-325 MG tablet Take 1 tablet by mouth every 6 (six) hours as needed for pain. 11/02/15   Hyatt, Max T, DPM  promethazine (PHENERGAN) 25 MG tablet Take 1 tablet (25 mg total) by mouth every 8 (eight) hours as needed for nausea or vomiting. 11/02/15   Hyatt, Max T, DPM    Allergies    Penicillins  Review of Systems   Review of Systems  Constitutional: Positive for chills and fever.  Respiratory: Negative for cough.   Gastrointestinal: Positive for abdominal pain and constipation. Negative for diarrhea, hematochezia and vomiting.  Genitourinary: Negative for flank pain.  All other systems reviewed and are negative.   Physical Exam Updated Vital Signs BP 119/80   Pulse 98   Temp 99 F (37.2 C) (Oral)   Resp 17   Ht 1.626 m (5\' 4" )   Wt 111.1 kg   SpO2 96%   BMI 42.05 kg/m   Physical Exam CONSTITUTIONAL: Well developed/well nourished, uncomfortable appearing HEAD: Normocephalic/atraumatic EYES: EOMI/PERRL ENMT: Mask in place NECK: supple no meningeal signs CV: S1/S2  noted, no murmurs/rubs/gallops noted LUNGS: Lungs are clear to auscultation bilaterally, no apparent distress ABDOMEN: soft, moderate LUQ/LLQ tenderness, no rebound or guarding, bowel sounds noted throughout abdomen GU:no cva tenderness NEURO: Pt is awake/alert/appropriate, moves all extremitiesx4.  No facial droop.   EXTREMITIES: pulses normal/equal, full ROM SKIN: warm, color normal PSYCH: no abnormalities of mood noted, alert and oriented to situation  ED Results / Procedures / Treatments   Labs (all labs ordered are listed, but only abnormal results are displayed) Labs Reviewed  COMPREHENSIVE METABOLIC PANEL - Abnormal; Notable for the following components:      Result Value   CO2 21 (*)    Glucose, Bld 115 (*)    All  other components within normal limits  CBC - Abnormal; Notable for the following components:   WBC 13.6 (*)    All other components within normal limits  LIPASE, BLOOD  URINALYSIS, ROUTINE W REFLEX MICROSCOPIC    EKG None  Radiology CT ABDOMEN PELVIS W CONTRAST  Result Date: 05/29/2020 CLINICAL DATA:  Left lower quadrant abdominal pain EXAM: CT ABDOMEN AND PELVIS WITH CONTRAST TECHNIQUE: Multidetector CT imaging of the abdomen and pelvis was performed using the standard protocol following bolus administration of intravenous contrast. CONTRAST:  OMNIPAQUE IOHEXOL 300 MG/ML  SOLN COMPARISON:  None. FINDINGS: Lower chest: The visualized heart size within normal limits. No pericardial fluid/thickening. No hiatal hernia. The visualized portions of the lungs are clear. Hepatobiliary: The liver is normal in density without focal abnormality.The main portal vein is patent. No evidence of calcified gallstones, gallbladder wall thickening or biliary dilatation. Pancreas: Unremarkable. No pancreatic ductal dilatation or surrounding inflammatory changes. Spleen: Normal in size without focal abnormality. Adrenals/Urinary Tract: Both adrenal glands appear normal. The kidneys  and collecting system appear normal without evidence of urinary tract calculus or hydronephrosis. Bladder is unremarkable. Stomach/Bowel: The stomach and small bowel are normal in appearance. There is a focal segment sigmoid colon with diverticula said has diffuse wall thickening and surrounding mesenteric fat stranding changes. There is non loculated fluid seen posteriorly. No loculated fluid collections or free air is seen. Vascular/Lymphatic: There are no enlarged mesenteric, retroperitoneal, or pelvic lymph nodes. No significant vascular findings are present. Reproductive: The patient is status post hysterectomy. No adnexal masses or collections seen. Other: No evidence of abdominal wall mass or hernia. Musculoskeletal: No acute or significant osseous findings. IMPRESSION: Findings consistent with acute sigmoid colonic diverticulitis. No loculated fluid collections or free air. Electronically Signed   By: Jonna Clark M.D.   On: 05/29/2020 03:15    Procedures Procedures    Medications Ordered in ED Medications  0.9 %  sodium chloride infusion ( Intravenous Stopped 05/29/20 0339)  fentaNYL (SUBLIMAZE) injection 100 mcg (100 mcg Intravenous Given 05/29/20 0233)  ondansetron (ZOFRAN) injection 4 mg (4 mg Intravenous Given 05/29/20 0233)  piperacillin-tazobactam (ZOSYN) IVPB 3.375 g (0 g Intravenous Stopped 05/29/20 0339)  iohexol (OMNIPAQUE) 300 MG/ML solution 100 mL (100 mLs Intravenous Contrast Given 05/29/20 0250)    ED Course  I have reviewed the triage vital signs and the nursing notes.  Pertinent labs & imaging results that were available during my care of the patient were reviewed by me and considered in my medical decision making (see chart for details).    MDM Rules/Calculators/A&P                            3:26 AM Patient with a recent history of diverticulitis and was prescribed Cipro Flagyl.  She reports it did improve, but it pain returned and is worse than before.  She reported  fevers and chills at home.  Patient appeared very uncomfortable on exam.  Strong suspicion for perforation/asbcess.  However CT imaging reveals diverticulitis without complicating features. She reports she has not had a colonoscopy 4:12 AM CT imaging negative for perforation or abscess Patient is feeling improved, taking p.o. fluids She would like to be discharged home. She reports she has pain and nausea medicine at home She would like to try a different antibiotic rather than Cipro/Flagyl. We will place her on Augmentin for 10 days. Patient reports she can tolerate this medicine & does not  have a true allergy to penicillin She reports having a PCP follow-up  this week. Strongly advise close follow-up with gastroenterology due to recurrent diverticulitis. Advised she will likely need to have a colonoscopy, and in rare cases patients need to have surgery.    This patient presents to the ED for concern of abdominal pain, this involves an extensive number of treatment options, and is a complaint that carries with it a high risk of complications and morbidity.  The differential diagnosis includes diverticulitis, bowel perforation, kidney stone, pyelonephritis, , pancreatitis   Lab Tests:   I Ordered, reviewed, and interpreted labs, which included electrolytes, LFTs, complete blood count, lipase  Medicines ordered:   I ordered medication fentanyl for pain and Zosyn for presumed diverticulitis  Imaging Studies ordered:   I ordered imaging studies which included CT abdomen pelvis   I independently visualized and interpreted imaging which showed diverticulitis without perforation  Additional history obtained:   Previous records obtained and reviewed     Reevaluation:  After the interventions stated above, I reevaluated the patient and found patient is improved   Final Clinical Impression(s) / ED Diagnoses Final diagnoses:  Sigmoid diverticulitis    Rx / DC Orders ED  Discharge Orders         Ordered    amoxicillin-clavulanate (AUGMENTIN) 875-125 MG tablet  Every 12 hours        05/29/20 0408           Zadie Rhine, MD 05/29/20 [phone]

## 2020-06-01 ENCOUNTER — Ambulatory Visit (INDEPENDENT_AMBULATORY_CARE_PROVIDER_SITE_OTHER): Payer: BC Managed Care – PPO | Admitting: Podiatry

## 2020-06-01 ENCOUNTER — Other Ambulatory Visit: Payer: Self-pay

## 2020-06-01 DIAGNOSIS — L600 Ingrowing nail: Secondary | ICD-10-CM | POA: Diagnosis not present

## 2020-06-01 NOTE — Progress Notes (Signed)
   Subjective: 54 y.o. female presents today status post permanent nail avulsion procedure of the medial border left great toe that was performed on 05/09/2020.   Past Medical History:  Diagnosis Date  . Diverticulitis   . Hypertension   . Ovarian cyst     Objective: Skin is warm, dry and supple. Nail and respective nail fold appears to be healing appropriately. Open wound to the associated nail fold with a granular wound base and moderate amount of fibrotic tissue. Minimal drainage noted. Mild erythema around the periungual region likely due to phenol chemical matricectomy.  Assessment: #1 postop permanent partial nail avulsion medial border left great toe #2 open wound periungual nail fold of respective digit.   Plan of care: #1 patient was evaluated  #2 debridement of open wound was performed to the periungual border of the respective toe using a currette. Antibiotic ointment and Band-Aid was applied. #3 patient is to return to clinic on a PRN basis.   Felecia Shelling, DPM [facility]  Dr. Felecia Shelling, DPM    [address] [place], [place] [address]                Office [phone]  Fax [phone]

## 2020-06-20 ENCOUNTER — Emergency Department (HOSPITAL_COMMUNITY): Payer: BC Managed Care – PPO

## 2020-06-20 ENCOUNTER — Inpatient Hospital Stay (HOSPITAL_COMMUNITY)
Admission: EM | Admit: 2020-06-20 | Discharge: 2020-06-26 | DRG: 872 | Disposition: A | Discharge status: Home / Self Care | Payer: BC Managed Care – PPO | Attending: Internal Medicine | Admitting: Internal Medicine

## 2020-06-20 ENCOUNTER — Encounter (HOSPITAL_COMMUNITY): Payer: Self-pay

## 2020-06-20 DIAGNOSIS — Z79899 Other long term (current) drug therapy: Secondary | ICD-10-CM

## 2020-06-20 DIAGNOSIS — K5720 Diverticulitis of large intestine with perforation and abscess without bleeding: Secondary | ICD-10-CM | POA: Diagnosis present

## 2020-06-20 DIAGNOSIS — I10 Essential (primary) hypertension: Secondary | ICD-10-CM | POA: Diagnosis present

## 2020-06-20 DIAGNOSIS — Z90711 Acquired absence of uterus with remaining cervical stump: Secondary | ICD-10-CM

## 2020-06-20 DIAGNOSIS — Z7982 Long term (current) use of aspirin: Secondary | ICD-10-CM | POA: Diagnosis not present

## 2020-06-20 DIAGNOSIS — R1032 Left lower quadrant pain: Secondary | ICD-10-CM

## 2020-06-20 DIAGNOSIS — E876 Hypokalemia: Secondary | ICD-10-CM | POA: Diagnosis present

## 2020-06-20 DIAGNOSIS — A419 Sepsis, unspecified organism: Principal | ICD-10-CM | POA: Diagnosis present

## 2020-06-20 DIAGNOSIS — Z6841 Body Mass Index (BMI) 40.0 and over, adult: Secondary | ICD-10-CM | POA: Diagnosis not present

## 2020-06-20 DIAGNOSIS — Z20822 Contact with and (suspected) exposure to covid-19: Secondary | ICD-10-CM | POA: Diagnosis present

## 2020-06-20 LAB — CBC
HCT: 41.1 % (ref 36.0–46.0)
Hemoglobin: 13.8 g/dL (ref 12.0–15.0)
MCH: 29.6 pg (ref 26.0–34.0)
MCHC: 33.6 g/dL (ref 30.0–36.0)
MCV: 88.2 fL (ref 80.0–100.0)
Platelets: 306 10*3/uL (ref 150–400)
RBC: 4.66 MIL/uL (ref 3.87–5.11)
RDW: 13.4 % (ref 11.5–15.5)
WBC: 14.4 10*3/uL — ABNORMAL HIGH (ref 4.0–10.5)
nRBC: 0 % (ref 0.0–0.2)

## 2020-06-20 LAB — URINALYSIS, ROUTINE W REFLEX MICROSCOPIC
Bacteria, UA: NONE SEEN
Bilirubin Urine: NEGATIVE
Glucose, UA: NEGATIVE mg/dL
Ketones, ur: 20 mg/dL — AB
Leukocytes,Ua: NEGATIVE
Nitrite: NEGATIVE
Protein, ur: NEGATIVE mg/dL
Specific Gravity, Urine: 1.013 (ref 1.005–1.030)
pH: 5 (ref 5.0–8.0)

## 2020-06-20 LAB — COMPREHENSIVE METABOLIC PANEL
ALT: 17 U/L (ref 0–44)
AST: 16 U/L (ref 15–41)
Albumin: 3.6 g/dL (ref 3.5–5.0)
Alkaline Phosphatase: 91 U/L (ref 38–126)
Anion gap: 10 (ref 5–15)
BUN: 8 mg/dL (ref 6–20)
CO2: 23 mmol/L (ref 22–32)
Calcium: 9.4 mg/dL (ref 8.9–10.3)
Chloride: 103 mmol/L (ref 98–111)
Creatinine, Ser: 0.9 mg/dL (ref 0.44–1.00)
GFR, Estimated: >60 mL/min (ref >60)
Glucose, Bld: 100 mg/dL — ABNORMAL HIGH (ref 70–99)
Potassium: 3.6 mmol/L (ref 3.5–5.1)
Sodium: 136 mmol/L (ref 135–145)
Total Bilirubin: 1.3 mg/dL — ABNORMAL HIGH (ref 0.3–1.2)
Total Protein: 7.4 g/dL (ref 6.5–8.1)

## 2020-06-20 LAB — LACTIC ACID, PLASMA: Lactic Acid, Venous: 0.9 mmol/L (ref 0.5–1.9)

## 2020-06-20 LAB — SARS CORONAVIRUS 2 (TAT 6-24 HRS): SARS Coronavirus 2: NEGATIVE

## 2020-06-20 LAB — LIPASE, BLOOD: Lipase: 16 U/L (ref 11–51)

## 2020-06-20 MED ORDER — SODIUM CHLORIDE 0.9 % IV BOLUS (SEPSIS): 1000.0000 mL | Freq: Once | INTRAVENOUS | Status: DC

## 2020-06-20 MED ORDER — HYDROMORPHONE HCL 1 MG/ML IJ SOLN
0.5000 mg | INTRAMUSCULAR | Status: DC | PRN
  Administered 2020-06-21 – 2020-06-23 (×4): 1 mg via INTRAVENOUS
  Filled 2020-06-20 (×4): qty 1

## 2020-06-20 MED ORDER — MORPHINE SULFATE (PF) 2 MG/ML IV SOLN: 2.0000 mg | INTRAVENOUS | Status: DC | PRN

## 2020-06-20 MED ORDER — PIPERACILLIN-TAZOBACTAM 3.375 G IVPB
3.3750 g | Freq: Three times a day (TID) | INTRAVENOUS | Status: DC
  Administered 2020-06-20 – 2020-06-26 (×17): 3.375 g via INTRAVENOUS
  Filled 2020-06-20 (×19): qty 50

## 2020-06-20 MED ORDER — SODIUM CHLORIDE 0.9 % IV BOLUS (SEPSIS)
1000.0000 mL | Freq: Once | INTRAVENOUS | Status: AC
  Administered 2020-06-20: 1000 mL via INTRAVENOUS

## 2020-06-20 MED ORDER — ACETAMINOPHEN 325 MG PO TABS
650.0000 mg | ORAL_TABLET | Freq: Four times a day (QID) | ORAL | Status: DC | PRN
  Administered 2020-06-21 – 2020-06-25 (×6): 650 mg via ORAL
  Filled 2020-06-20 (×7): qty 2

## 2020-06-20 MED ORDER — MORPHINE SULFATE (PF) 4 MG/ML IV SOLN
4.0000 mg | Freq: Once | INTRAVENOUS | Status: AC
  Administered 2020-06-20: 4 mg via INTRAVENOUS
  Filled 2020-06-20: qty 1

## 2020-06-20 MED ORDER — SODIUM CHLORIDE 0.9 % IV SOLN: INTRAVENOUS | Status: DC

## 2020-06-20 MED ORDER — PIPERACILLIN-TAZOBACTAM 3.375 G IVPB 30 MIN
3.3750 g | Freq: Once | INTRAVENOUS | Status: AC
  Administered 2020-06-20: 3.375 g via INTRAVENOUS
  Filled 2020-06-20: qty 50

## 2020-06-20 MED ORDER — SODIUM CHLORIDE 0.9% FLUSH
3.0000 mL | Freq: Two times a day (BID) | INTRAVENOUS | Status: DC
  Administered 2020-06-21 – 2020-06-25 (×6): 3 mL via INTRAVENOUS

## 2020-06-20 MED ORDER — SODIUM CHLORIDE 0.9 % IV BOLUS (SEPSIS)
500.0000 mL | Freq: Once | INTRAVENOUS | Status: AC
  Administered 2020-06-20: 500 mL via INTRAVENOUS

## 2020-06-20 MED ORDER — SACCHAROMYCES BOULARDII 250 MG PO CAPS
250.0000 mg | ORAL_CAPSULE | Freq: Two times a day (BID) | ORAL | Status: DC
  Administered 2020-06-20 – 2020-06-26 (×12): 250 mg via ORAL
  Filled 2020-06-20 (×13): qty 1

## 2020-06-20 MED ORDER — PIPERACILLIN-TAZOBACTAM 3.375 G IVPB 30 MIN: 3.3750 g | Freq: Three times a day (TID) | INTRAVENOUS | Status: DC

## 2020-06-20 MED ORDER — ACETAMINOPHEN 650 MG RE SUPP: 650.0000 mg | Freq: Four times a day (QID) | RECTAL | Status: DC | PRN

## 2020-06-20 MED ORDER — METHOCARBAMOL 1000 MG/10ML IJ SOLN
500.0000 mg | Freq: Four times a day (QID) | INTRAVENOUS | Status: DC | PRN
  Filled 2020-06-20 (×3): qty 5

## 2020-06-20 MED ORDER — LACTATED RINGERS IV SOLN: INTRAVENOUS | Status: DC

## 2020-06-20 MED ORDER — SODIUM CHLORIDE 0.9 % IV BOLUS
1000.0000 mL | Freq: Once | INTRAVENOUS | Status: AC
  Administered 2020-06-20: 1000 mL via INTRAVENOUS

## 2020-06-20 MED ORDER — ALBUTEROL SULFATE (2.5 MG/3ML) 0.083% IN NEBU: 2.5000 mg | INHALATION_SOLUTION | Freq: Four times a day (QID) | RESPIRATORY_TRACT | Status: DC | PRN

## 2020-06-20 MED ORDER — ONDANSETRON HCL 4 MG/2ML IJ SOLN
4.0000 mg | Freq: Four times a day (QID) | INTRAMUSCULAR | Status: DC | PRN
  Administered 2020-06-22 – 2020-06-25 (×2): 4 mg via INTRAVENOUS
  Filled 2020-06-20 (×2): qty 2

## 2020-06-20 MED ORDER — ONDANSETRON HCL 4 MG PO TABS: 4.0000 mg | ORAL_TABLET | Freq: Four times a day (QID) | ORAL | Status: DC | PRN

## 2020-06-20 MED ORDER — ONDANSETRON HCL 4 MG/2ML IJ SOLN
4.0000 mg | Freq: Once | INTRAMUSCULAR | Status: AC
  Administered 2020-06-20: 4 mg via INTRAVENOUS
  Filled 2020-06-20: qty 2

## 2020-06-20 MED ORDER — OXYCODONE HCL 5 MG PO TABS
5.0000 mg | ORAL_TABLET | ORAL | Status: DC | PRN
  Administered 2020-06-21 – 2020-06-24 (×2): 5 mg via ORAL
  Filled 2020-06-20 (×3): qty 1

## 2020-06-20 MED ORDER — ENOXAPARIN SODIUM 40 MG/0.4ML ~~LOC~~ SOLN
40.0000 mg | SUBCUTANEOUS | Status: DC
  Administered 2020-06-20 – 2020-06-23 (×4): 40 mg via SUBCUTANEOUS
  Filled 2020-06-20 (×4): qty 0.4

## 2020-06-20 MED ORDER — IOHEXOL 300 MG/ML  SOLN
100.0000 mL | Freq: Once | INTRAMUSCULAR | Status: AC | PRN
  Administered 2020-06-20: 100 mL via INTRAVENOUS

## 2020-06-20 NOTE — Consult Note (Signed)
Referring Provider: ED Primary Care Physician:  Deatra James, MD Primary Gastroenterologist:  Dr. Matthias Hughs [facility])  Reason for Consultation:  Diverticulitis  HPI: Deborah Chaney is a 54 y.o. female with history of diverticulitis presenting for consultation of diverticulitis.  Patient was seen in the [facility] office today due to ongoing diverticulitis and was found to be severely tender with concern for peritoneal signs, thus she was advised to present to the ED.  Patient was originally diagnosed with diverticulitis on 05/02/20 at [facility], at which time CT showed evidence of midsigmoid region diverticulitis without perforation or abscess.  She was started on Cipro/Flagyl x 10 days.  Patient noted improvement, but then symptoms recurred, at which point she re-presented to the ED on 12/5, and CT at that time also showed acute sigmoid colonic diverticulitis.  She was prescribed Augmentin for 10 days. She was seen in the office on 12/20, at which time, she noted improvement but not resolution of symptoms.  She was given another 10 day course of Augmentin.    Yesterday morning, her symptoms acutely worsened.  She had a bowel movement yesterday and had severe pain, as well as hot flashes.  She then felt very cold afterward but did not take her temperature so is unsure if she had a fever.  She has been having 1-2 soft bowel movements per day.  Denies any rectal bleeding.    Reports nausea and decreased appetite since symptoms began.  She has lost approximately 6 lbs in the last 1 month.  Denies any vomiting, dysphagia, GERD.    Takes 81 mg ASA daily. Denies blood thinner use.  Reports father with a history of diverticulosis and diverticular bleeding but denies other pertinent family history.  Last colonoscopy in 2016 was pertinent only for diverticulosis.   Past Medical History:  Diagnosis Date  . Diverticulitis   . Hypertension   . Ovarian cyst     Past Surgical History:   Procedure Laterality Date  . ABDOMINAL HYSTERECTOMY    . APPENDECTOMY    . LAPAROSCOPIC OVARIAN CYSTECTOMY    . TUBAL LIGATION      Prior to Admission medications   Medication Sig Start Date End Date Taking? Authorizing Provider  amLODipine (NORVASC) 10 MG tablet Take 10 mg by mouth daily. 02/19/20   [provider]  amLODipine (NORVASC) 5 MG tablet Take 5 mg by mouth daily.    [provider]  amoxicillin-clavulanate (AUGMENTIN) 875-125 MG tablet Take 1 tablet by mouth every 12 (twelve) hours. 05/29/20   Zadie Rhine, MD  Ascorbic Acid (VITAMIN C) 100 MG tablet Take 100 mg by mouth daily.    [provider]  aspirin 81 MG tablet Take 81 mg by mouth daily.    [provider]  calcium carbonate (OS-CAL - DOSED IN MG OF ELEMENTAL CALCIUM) 1250 MG tablet Take 1 tablet by mouth daily.    [provider]  Calcium Carbonate-Vit D-Min (CALCIUM 1200 PO) Take by mouth.    [provider]  cholecalciferol (VITAMIN D) 1000 UNITS tablet Take 1,000 Units by mouth daily.    [provider]  diphenhydrAMINE (BENADRYL) 25 MG tablet Take 25 mg by mouth every 6 (six) hours as needed for itching. Reported on 06/16/2015    [provider]  diphenhydrAMINE (SOMINEX) 25 MG tablet Take by mouth.    [provider]  ergocalciferol (VITAMIN D2) 1.25 MG (50000 UT) capsule Take 50,000 Units by mouth once a week.  [provider]  gentamicin cream (GARAMYCIN) 0.1 % Apply 1 application topically 2 (two) times daily. 05/09/20   Felecia Shelling, DPM  Multiple Vitamin (MULTIVITAMIN) tablet Take 1 tablet by mouth daily. Reported on 06/16/2015    [provider]  NEOMYCIN-POLYMYXIN-HYDROCORTISONE (CORTISPORIN) 1 % SOLN OTIC solution Apply 1-2 drops to toe BID after soaking Patient taking differently: 1-2 drops See admin instructions. Apply 1-2 drops to toe BID after soaking 08/14/18   Hyatt, Max T, DPM  promethazine  (PHENERGAN) 25 MG tablet Take 1 tablet (25 mg total) by mouth every 8 (eight) hours as needed for nausea or vomiting. 11/02/15 05/29/20  Hyatt, Max T, DPM    Scheduled Meds: Continuous Infusions: PRN Meds:.  Allergies as of 06/20/2020 - Review Complete 06/20/2020  Allergen Reaction Noted  . Penicillins Other (See Comments) 08/27/2012    Family History  Problem Relation Age of Onset  . Hypertension Mother   . Diabetes Mother   . Hypertension Father   . Diabetes Father     Social History   Socioeconomic History  . Marital status: Married    Spouse name: Not on file  . Number of children: Not on file  . Years of education: Not on file  . Highest education level: Not on file  Occupational History  . Not on file  Tobacco Use  . Smoking status: Never Smoker  . Smokeless tobacco: Never Used  Vaping Use  . Vaping Use: Never used  Substance and Sexual Activity  . Alcohol use: No  . Drug use: No  . Sexual activity: Not on file  Other Topics Concern  . Not on file  Social History Narrative  . Not on file   Social Determinants of Health   Financial Resource Strain: Not on file  Food Insecurity: Not on file  Transportation Needs: Not on file  Physical Activity: Not on file  Stress: Not on file  Social Connections: Not on file  Intimate Partner Violence: Not on file    Review of Systems: Review of Systems  Constitutional: Positive for chills and weight loss.  HENT: Negative for hearing loss and tinnitus.   Eyes: Negative for pain and redness.  Respiratory: Negative for cough and shortness of breath.   Cardiovascular: Negative for chest pain and palpitations.  Gastrointestinal: Positive for abdominal pain and nausea. Negative for blood in stool, constipation, diarrhea, heartburn, melena and vomiting.  Genitourinary: Negative for flank pain and hematuria.  Musculoskeletal: Negative for falls and joint pain.  Skin: Negative for itching and rash.  Neurological: Negative  for seizures and loss of consciousness.  Endo/Heme/Allergies: Negative for polydipsia. Does not bruise/bleed easily.  Psychiatric/Behavioral: Negative for substance abuse. The patient is not nervous/anxious.     Physical Exam: Vital signs: Vitals:   06/20/20 1243  BP: 131/87  Pulse: (!) 105  Resp: 18  Temp: 98 F (36.7 C)  SpO2: 97%     Physical Exam Vitals reviewed.  Constitutional:      General: She is not in acute distress. HENT:     Head: Normocephalic and atraumatic.     Nose: Nose normal. No congestion.     Mouth/Throat:     Mouth: Mucous membranes are moist.     Pharynx: Oropharynx is clear.  Eyes:     General: No scleral icterus.    Extraocular Movements: Extraocular movements intact.     Conjunctiva/sclera: Conjunctivae normal.  Cardiovascular:     Rate and Rhythm: Regular rhythm. Tachycardia present.  Pulses: Normal pulses.  Pulmonary:     Effort: Pulmonary effort is normal. No respiratory distress.     Breath sounds: Normal breath sounds.  Abdominal:     General: Bowel sounds are normal. There is no distension.     Palpations: Abdomen is soft. There is no mass.     Tenderness: There is abdominal tenderness (LLQ, moderate to severe). There is guarding. There is no rebound.     Hernia: No hernia is present.  Musculoskeletal:        General: No swelling or tenderness.     Cervical back: Normal range of motion and neck supple.  Skin:    General: Skin is warm and dry.  Neurological:     General: No focal deficit present.     Mental Status: She is alert and oriented to person, place, and time.  Psychiatric:        Mood and Affect: Mood normal.        Behavior: Behavior normal. Behavior is cooperative.      GI:  Lab Results: Recent Labs    06/20/20 1257  WBC 14.4*  HGB 13.8  HCT 41.1  PLT 306   BMET Recent Labs    06/20/20 1257  NA 136  K 3.6  CL 103  CO2 23  GLUCOSE 100*  BUN 8  CREATININE 0.90  CALCIUM 9.4   LFT Recent Labs     06/20/20 1257  PROT 7.4  ALBUMIN 3.6  AST 16  ALT 17  ALKPHOS 91  BILITOT 1.3*   PT/INR No results for input(s): LABPROT, INR in the last 72 hours.   Studies/Results: No results found.  Impression: Diverticulitis, failed outpatient treatment. -WBCs 14.4, mildly increased as compared to 13.6 on 05/28/20 and 13.5 on 05/02/20  Plan: Agree with STAT CT to rule out perforation or complications.  Initiate empiric antibiotics.  Keep patient NPO while awaiting CT scan.  [facility] will follow.    LOS: 0 days   Edrick Kins  PA-C 06/20/2020, 2:45 PM  Contact #  [phone]

## 2020-06-20 NOTE — ED Provider Notes (Signed)
[facility] EMERGENCY DEPARTMENT Provider Note   CSN: [id number] Arrival date & time: 06/20/20  1158     History Chief Complaint  Patient presents with  . Abdominal Pain    Deborah Chaney is a 54 y.o. female.  54 y.o female with a PMH of diverticulitis presents to the ED with a chief complaint of left lower quadrant abdominal pain which began 2 days ago.  This has been an ongoing problem, first began the first week of November.  Patient has trialed Cipro Flagyl, Augmentin x1, is currently on her second round of Alimentum on day 5, symptoms have continued to worsen.  Include severe pain at the left lower quadrant, nausea and anorexia.  Evaluated by [facility] in office today and sent to the ED for CT imaging.  Total surgeries including appendectomy, partial hysterectomy, cholecystectomy.  Currently not on any blood thinners but does take aspirin 81 mg daily.  The history is provided by the patient.  Abdominal Pain Pain location:  LLQ Pain quality: sharp   Pain radiates to:  Suprapubic region Pain severity:  Mild Onset quality:  Gradual Duration:  2 days Timing:  Constant Progression:  Worsening Chronicity:  Recurrent Context: not alcohol use, not awakening from sleep, not diet changes, not eating, not recent illness, not recent sexual activity and not suspicious food intake   Relieved by:  Nothing Ineffective treatments:  Bowel activity Associated symptoms: nausea   Associated symptoms: no chest pain, no chills, no constipation, no cough, no diarrhea, no fever, no flatus, no hematemesis and no shortness of breath   Risk factors: aspirin and multiple surgeries   Risk factors: not pregnant and no recent hospitalization        Past Medical History:  Diagnosis Date  . Diverticulitis   . Hypertension   . Ovarian cyst     Patient Active Problem List   Diagnosis Date Noted  . Breast lump 02/21/2017  . Cyst of ovary 02/21/2017  . Lower abdominal pain  02/21/2017  . Obesity 02/21/2017  . Upper respiratory infection 02/21/2017    Past Surgical History:  Procedure Laterality Date  . ABDOMINAL HYSTERECTOMY    . APPENDECTOMY    . LAPAROSCOPIC OVARIAN CYSTECTOMY    . TUBAL LIGATION       OB History   No obstetric history on file.     Family History  Problem Relation Age of Onset  . Hypertension Mother   . Diabetes Mother   . Hypertension Father   . Diabetes Father     Social History   Tobacco Use  . Smoking status: Never Smoker  . Smokeless tobacco: Never Used  Vaping Use  . Vaping Use: Never used  Substance Use Topics  . Alcohol use: No  . Drug use: No    Home Medications Prior to Admission medications   Medication Sig Start Date End Date Taking? Authorizing Provider  amLODipine (NORVASC) 10 MG tablet Take 10 mg by mouth daily. 02/19/20   [provider]  amLODipine (NORVASC) 5 MG tablet Take 5 mg by mouth daily.    [provider]  amoxicillin-clavulanate (AUGMENTIN) 875-125 MG tablet Take 1 tablet by mouth every 12 (twelve) hours. 05/29/20   Zadie Rhine, MD  Ascorbic Acid (VITAMIN C) 100 MG tablet Take 100 mg by mouth daily.    [provider]  aspirin 81 MG tablet Take 81 mg by mouth daily.    [provider]  calcium carbonate (OS-CAL - DOSED  IN MG OF ELEMENTAL CALCIUM) 1250 MG tablet Take 1 tablet by mouth daily.    [provider]  Calcium Carbonate-Vit D-Min (CALCIUM 1200 PO) Take by mouth.    [provider]  cholecalciferol (VITAMIN D) 1000 UNITS tablet Take 1,000 Units by mouth daily.    [provider]  diphenhydrAMINE (BENADRYL) 25 MG tablet Take 25 mg by mouth every 6 (six) hours as needed for itching. Reported on 06/16/2015    [provider]  diphenhydrAMINE (SOMINEX) 25 MG tablet Take by mouth.    [provider]  ergocalciferol (VITAMIN D2) 1.25 MG (50000 UT) capsule Take 50,000 Units by mouth once a week.    [provider]  gentamicin cream (GARAMYCIN) 0.1 % Apply 1 application topically 2 (two) times daily. 05/09/20   Felecia Shelling, DPM  Multiple Vitamin (MULTIVITAMIN) tablet Take 1 tablet by mouth daily. Reported on 06/16/2015    [provider]  NEOMYCIN-POLYMYXIN-HYDROCORTISONE (CORTISPORIN) 1 % SOLN OTIC solution Apply 1-2 drops to toe BID after soaking Patient taking differently: 1-2 drops See admin instructions. Apply 1-2 drops to toe BID after soaking 08/14/18   Hyatt, Max T, DPM  promethazine (PHENERGAN) 25 MG tablet Take 1 tablet (25 mg total) by mouth every 8 (eight) hours as needed for nausea or vomiting. 11/02/15 05/29/20  Hyatt, Max T, DPM    Allergies    Penicillins  Review of Systems   Review of Systems  Constitutional: Negative for chills and fever.  Respiratory: Negative for cough and shortness of breath.   Cardiovascular: Negative for chest pain.  Gastrointestinal: Positive for abdominal pain and nausea. Negative for constipation, diarrhea, flatus and hematemesis.  All other systems reviewed and are negative.   Physical Exam Updated Vital Signs BP 133/82   Pulse (!) 108   Temp 99.1 F (37.3 C) (Oral)   Resp 18   SpO2 96%   Physical Exam Vitals and nursing note reviewed.  Constitutional:      General: She is not in acute distress.    Appearance: She is well-developed and well-nourished.  HENT:     Head: Normocephalic and atraumatic.     Mouth/Throat:     Mouth: Oropharynx is clear and moist.     Pharynx: No oropharyngeal exudate.  Eyes:     Pupils: Pupils are equal, round, and reactive to light.  Cardiovascular:     Rate and Rhythm: Regular rhythm.     Heart sounds: Normal heart sounds.  Pulmonary:     Effort: Pulmonary effort is normal. No respiratory distress.     Breath sounds: Normal breath sounds.  Abdominal:     General: Bowel sounds are normal. There is no distension.     Palpations: Abdomen is soft.     Tenderness: There is abdominal  tenderness in the left lower quadrant. There is no right CVA tenderness or left CVA tenderness.  Musculoskeletal:        General: No tenderness or deformity.     Cervical back: Normal range of motion.     Right lower leg: No edema.     Left lower leg: No edema.  Skin:    General: Skin is warm and dry.  Neurological:     Mental Status: She is alert and oriented to person, place, and time.  Psychiatric:        Mood and Affect: Mood and affect normal.     ED Results / Procedures / Treatments   Labs (all labs ordered  are listed, but only abnormal results are displayed) Labs Reviewed  COMPREHENSIVE METABOLIC PANEL - Abnormal; Notable for the following components:      Result Value   Glucose, Bld 100 (*)    Total Bilirubin 1.3 (*)    All other components within normal limits  CBC - Abnormal; Notable for the following components:   WBC 14.4 (*)    All other components within normal limits  URINALYSIS, ROUTINE W REFLEX MICROSCOPIC - Abnormal; Notable for the following components:   Hgb urine dipstick SMALL (*)    Ketones, ur 20 (*)    All other components within normal limits  LIPASE, BLOOD    EKG None  Radiology No results found.  Procedures Procedures (including critical care time)  Medications Ordered in ED Medications  sodium chloride 0.9 % bolus 1,000 mL (has no administration in time range)  ondansetron (ZOFRAN) injection 4 mg (has no administration in time range)  piperacillin-tazobactam (ZOSYN) IVPB 3.375 g (has no administration in time range)    ED Course  I have reviewed the triage vital signs and the nursing notes.  Pertinent labs & imaging results that were available during my care of the patient were reviewed by me and considered in my medical decision making (see chart for details).  Clinical Course as of 06/20/20 1519  Mon Jun 20, 2020  1426 WBC(!): 14.4 [JS]  1500 Hgb urine dipstick(!): SMALL [JS]    Clinical Course User Index [JS] Claude MangesSoto, Epic Tribbett,  PA-C   MDM Rules/Calculators/A&P   Patient with a past medical history of diverticulitis presents to the ED with a chief complaint of left lower quadrant abdominal pain which began the first week of November.  Symptoms were exacerbated in the past 2 days.  She was recently treated for diverticulitis with a trial of Cipro Flagyl, failed that therapy, then placed on Augmentin x1, and a second round of omentum, currently on day 5 without any improvement in symptoms.  She endorses some chills, low-grade temp, [facility] and referred to the ED to obtain CT imaging.  Physical evaluation is unremarkable aside from tenderness in the left lower quadrant, seems to be pretty severe, some suspicion for perforation at this time.  The rotation of her labs revealed a leukocytosis of 14.4, hemoglobin is within normal limits.  Lipase level is within normal limits.  CMP without any electrolyte abnormality, creatinine levels within normal limits.  LFTs are unremarkable.  Lipase level is normal.  UA with small hemoglobin, no signs of infection.  CT abdomen and pelvis has been ordered.  Bolus, Zofran has been given to patient as well. While elevating patient, will GI at the bedside who recommends admission for patient at this time. Patient is a prior patient of Dr. Matthias HughsBuccini.   3:19 PM pharmacist has been consulted to obtain appropriate therapy of antibiotics for patient, will be given Zosyn while in the ED.  Patient is pending CT imaging, suspect likely will need admission for further management of her reticulitis with failed outpatient treatment.  Care signed out to incoming provider Shay P. PA-C at shift change.    Portions of this note were generated with Scientist, clinical (histocompatibility and immunogenetics)Dragon dictation software. Dictation errors may occur despite best attempts at proofreading.  Final Clinical Impression(s) / ED Diagnoses Final diagnoses:  Left lower quadrant abdominal pain    Rx / DC Orders ED Discharge Orders     None       Claude MangesSoto, Estil Vallee, PA-C 06/20/20 1519  Rozelle Logan, [place] 06/21/20 [id number]

## 2020-06-20 NOTE — ED Provider Notes (Signed)
Care of the patient was assumed from J. Soto PA-C at handoff; see this physician's note for complete history of present illness, review of systems, and physical exam.  Briefly, the patient is a 54 y.o. female who presented to the ED with LLQ pain.  History significant for diverticulitis.  Patient has already trialed Cipro, Flagyl and Augmentin, currently on her second round of Augmentin on day 5 and symptoms of continue to worsen, sent here by [facility] for further evaluation.  Plan at time of handoff:  Awaiting CT. Will be admitted for IV antibiotics, have already been evaluated by [facility].     Physical Exam  BP 133/82   Pulse (!) 108   Temp 99.1 F (37.3 C) (Oral)   Resp 18   SpO2 96%   Physical Exam Constitutional:      General: She is not in acute distress.    Appearance: Normal appearance. She is not ill-appearing, toxic-appearing or diaphoretic.  Cardiovascular:     Rate and Rhythm: Regular rhythm. Tachycardia present.     Pulses: Normal pulses.  Pulmonary:     Effort: Pulmonary effort is normal.     Breath sounds: Normal breath sounds.  Abdominal:     Tenderness: There is abdominal tenderness in the left lower quadrant. There is guarding.  Musculoskeletal:        General: Normal range of motion.  Skin:    General: Skin is warm and dry.     Capillary Refill: Capillary refill takes less than 2 seconds.  Neurological:     General: No focal deficit present.     Mental Status: She is alert and oriented to person, place, and time.  Psychiatric:        Mood and Affect: Mood normal.        Behavior: Behavior normal.        Thought Content: Thought content normal.     ED Course/Procedures   Clinical Course as of 06/20/20 1508  Mon Jun 20, 2020  1426 WBC(!): 14.4 [JS]  1500 Hgb urine dipstick(!): SMALL [JS]    Clinical Course User Index [JS] Claude Manges, PA-C    .Critical Care Performed by: Farrel Gordon, PA-C Authorized by: Farrel Gordon, PA-C   Critical  care provider statement:    Critical care time (minutes):  45   Critical care was necessary to treat or prevent imminent or life-threatening deterioration of the following conditions:  Sepsis   Critical care was time spent personally by me on the following activities:  Discussions with consultants, evaluation of patient's response to treatment, examination of patient, ordering and performing treatments and interventions, ordering and review of laboratory studies, ordering and review of radiographic studies, pulse oximetry, re-evaluation of patient's condition, obtaining history from patient or surrogate and review of old charts    MDM  54 year old female, CT showing diverticulitis with small microperforation with concerns of developing abscess.  Patient is tachycardic, does have elevated white count and would currently meet septic protocol at this time.  Sepsis order set initiated, will obtain blood cultures and lactic acid.  We will also initiate 30 cc/kg fluid at this time.  Patient n.p.o.  Patient is currently getting IV abx.Marland Kitchen  Spoke to General surgery Dr. Fredricka Bonine who will consult.   Patient admitted to Dr. Katrinka Blazing, [facility].  The patient appears reasonably stabilized for admission considering the current resources, flow, and capabilities available in the ED at this time, and I doubt any other [facility] requiring further screening  and/or treatment in the ED prior to admission.  I discussed this case with my attending physician who cosigned this note including patient's presenting symptoms, physical exam, and planned diagnostics and interventions. Attending physician stated agreement with plan or made changes to plan which were implemented.   Attending physician assessed patient at bedside.       Farrel Gordon, PA-C 06/20/20 1939    Arby Barrette, MD 06/20/20 2043

## 2020-06-20 NOTE — Consult Note (Signed)
Surgical Evaluation  Chief Complaint: abdominal pain  HPI: 54 year old woman with multiple previous episodes of diverticulitis beginning more than 5 years ago but with increased frequency and persistent problems with diverticular pain over the last few months.  She was most recently diagnosed with uncomplicated diverticulitis on November 8 at [facility] and was treated for 10 days with Cipro and Flagyl, which yielded some improvement but symptoms recurred when therapy was completed.  She returned on 12 5 and again had acute uncomplicated diverticulitis and this was treated for Augmentin for 10 days.  She was evaluated by her gastroenterologist Dr. Matthias Hughs at [facility] on December 20 and again noted improvement but not resolution of symptoms and was prescribed another 10-day course of Augmentin.  Things were improving until yesterday morning when her pain acutely worsened.  She notes a normal bowel movement this morning.  Endorses intermittent nausea and decreased appetite but no vomiting.  Unsure if she has had a fever.  Last colonoscopy 5 years ago noted diffuse colonic diverticulosis.  Given worsening pain and concerning physical exam she was referred to the emergency room for imaging, and has undergone a CT scan demonstrating diverticulitis with microperforation, results below.  She is a court Teacher, music for [employer].  She lives at home with her husband.  Allergies  Allergen Reactions  . Penicillins Other (See Comments)    Head feels tight Face tightens up    Past Medical History:  Diagnosis Date  . Diverticulitis   . Hypertension   . Ovarian cyst     Past Surgical History:  Procedure Laterality Date  . ABDOMINAL HYSTERECTOMY    . APPENDECTOMY    . LAPAROSCOPIC OVARIAN CYSTECTOMY    . TUBAL LIGATION      Family History  Problem Relation Age of Onset  . Hypertension Mother   . Diabetes Mother   . Hypertension Father   . Diabetes Father     Social  History   Socioeconomic History  . Marital status: Married    Spouse name: Not on file  . Number of children: Not on file  . Years of education: Not on file  . Highest education level: Not on file  Occupational History  . Not on file  Tobacco Use  . Smoking status: Never Smoker  . Smokeless tobacco: Never Used  Vaping Use  . Vaping Use: Never used  Substance and Sexual Activity  . Alcohol use: No  . Drug use: No  . Sexual activity: Not on file  Other Topics Concern  . Not on file  Social History Narrative  . Not on file   Social Determinants of Health   Financial Resource Strain: Not on file  Food Insecurity: Not on file  Transportation Needs: Not on file  Physical Activity: Not on file  Stress: Not on file  Social Connections: Not on file    No current facility-administered medications on file prior to encounter.   Current Outpatient Medications on File Prior to Encounter  Medication Sig Dispense Refill  . amLODipine (NORVASC) 10 MG tablet Take 10 mg by mouth daily.    Marland Kitchen amLODipine (NORVASC) 5 MG tablet Take 5 mg by mouth daily.    Marland Kitchen amoxicillin-clavulanate (AUGMENTIN) 875-125 MG tablet Take 1 tablet by mouth every 12 (twelve) hours. 20 tablet 0  . Ascorbic Acid (VITAMIN C) 100 MG tablet Take 100 mg by mouth daily.    Marland Kitchen aspirin 81 MG tablet Take 81 mg by mouth daily.    Marland Kitchen  calcium carbonate (OS-CAL - DOSED IN MG OF ELEMENTAL CALCIUM) 1250 MG tablet Take 1 tablet by mouth daily.    . Calcium Carbonate-Vit D-Min (CALCIUM 1200 PO) Take by mouth.    . cholecalciferol (VITAMIN D) 1000 UNITS tablet Take 1,000 Units by mouth daily.    . diphenhydrAMINE (BENADRYL) 25 MG tablet Take 25 mg by mouth every 6 (six) hours as needed for itching. Reported on 06/16/2015    . diphenhydrAMINE (SOMINEX) 25 MG tablet Take by mouth.    . ergocalciferol (VITAMIN D2) 1.25 MG (50000 UT) capsule Take 50,000 Units by mouth once a week.    Marland Kitchen gentamicin cream (GARAMYCIN) 0.1 % Apply 1 application  topically 2 (two) times daily. 15 g 1  . Multiple Vitamin (MULTIVITAMIN) tablet Take 1 tablet by mouth daily. Reported on 06/16/2015    . NEOMYCIN-POLYMYXIN-HYDROCORTISONE (CORTISPORIN) 1 % SOLN OTIC solution Apply 1-2 drops to toe BID after soaking (Patient taking differently: 1-2 drops See admin instructions. Apply 1-2 drops to toe BID after soaking) 10 mL 1  . [DISCONTINUED] promethazine (PHENERGAN) 25 MG tablet Take 1 tablet (25 mg total) by mouth every 8 (eight) hours as needed for nausea or vomiting. 20 tablet 0    Review of Systems: a complete, 10pt review of systems was completed with pertinent positives and negatives as documented in the HPI  Physical Exam: Vitals:   06/20/20 1500 06/20/20 1600  BP: 138/77 130/77  Pulse: (!) 107 (!) 108  Resp: 17 19  Temp:    SpO2: 96% 91%   Gen: A&Ox3, no distress  Eyes: lids and conjunctivae normal, no icterus. Pupils equally round and reactive to light.  Neck: supple without mass or thyromegaly Chest: respiratory effort is normal. No crepitus or tenderness on palpation of the chest. Breath sounds equal.  Cardiovascular: RRR with palpable distal pulses Gastrointestinal: soft, obese, mildly diffusely tender with significant tenderness and voluntary guarding in the left lower quadrant. No mass, hepatomegaly or splenomegaly. No hernia. Muscoloskeletal: no clubbing or cyanosis of the fingers.  Strength is symmetrical throughout.  Range of motion of bilateral upper and lower extremities normal without pain, crepitation or contracture. Neuro: cranial nerves grossly intact.  Sensation intact to light touch diffusely. Psych: appropriate mood and affect, normal insight/judgment intact  Skin: warm and dry   CBC Latest Ref Rng & Units 06/20/2020 05/28/2020 05/02/2020  WBC 4.0 - 10.5 K/uL 14.4(H) 13.6(H) 13.5(H)  Hemoglobin 12.0 - 15.0 g/dL 01.0 27.2 53.6  Hematocrit 36.0 - 46.0 % 41.1 42.2 42.2  Platelets 150 - 400 K/uL 306 325 312    CMP Latest Ref  Rng & Units 06/20/2020 05/28/2020 05/02/2020  Glucose 70 - 99 mg/dL 644(I) 347(Q) 259(D)  BUN 6 - 20 mg/dL 8 12 10   Creatinine 0.44 - 1.00 mg/dL 6.38 7.56  Sodium 135 - 145 mmol/L 136 136 137  Potassium 3.5 - 5.1 mmol/L 3.6 4.1 3.8  Chloride 98 - 111 mmol/L 103 105 102  CO2 22 - 32 mmol/L 23 21(L) 24  Calcium 8.9 - 10.3 mg/dL 9.4 9.3 9.3  Total Protein 6.5 - 8.1 g/dL 7.4 8.1 7.9  Total Bilirubin 0.3 - 1.2 mg/dL 4.33) 0.7 0.8  Alkaline Phos 38 - 126 U/L 91 83 82  AST 15 - 41 U/L 16 16 16   ALT 0 - 44 U/L 17 20 20     Lab Results  Component Value Date   INR 0.99 06/12/2013    Imaging: CT ABDOMEN PELVIS W CONTRAST  Result Date:  06/20/2020 CLINICAL DATA:  54 year old female with abdominal pain. Concern for acute diverticulitis. EXAM: CT ABDOMEN AND PELVIS WITH CONTRAST TECHNIQUE: Multidetector CT imaging of the abdomen and pelvis was performed using the standard protocol following bolus administration of intravenous contrast. CONTRAST:  OMNIPAQUE IOHEXOL 300 MG/ML  SOLN COMPARISON:  CT abdomen pelvis dated 05/29/2020. FINDINGS: Lower chest: The visualized lung bases are clear. No intra-abdominal free air. Trace free fluid in the pelvis. Hepatobiliary: Probable mild fatty liver. No intrahepatic biliary ductal dilatation. The gallbladder is unremarkable. Pancreas: Unremarkable. No pancreatic ductal dilatation or surrounding inflammatory changes. Spleen: Several subcentimeter splenic hypodense lesions are too small to characterize. The spleen is otherwise unremarkable. Adrenals/Urinary Tract: The adrenal glands unremarkable. The kidneys, visualized ureters, and urinary bladder appear unremarkable. Stomach/Bowel: There is sigmoid diverticulosis. There is active inflammatory changes involving the distal descending/sigmoid junction consistent with acute diverticulitis. Small pockets of air adjacent to the sigmoid colon consistent with focally contained micro perforation. A 3.0 x 1.2 cm fluid  adjacent to the sigmoid colon may represent inflammatory fluid. Developing abscess is not excluded. No drainable fluid collection identified at this time. There is diffuse colonic diverticula. There is no bowel obstruction. Appendectomy. Vascular/Lymphatic: The abdominal aorta and IVC are unremarkable. No portal venous gas. There is no adenopathy. Reproductive: Hysterectomy. No adnexal masses. Other: None Musculoskeletal: No acute or significant osseous findings. IMPRESSION: Sigmoid diverticulitis with focally contained micro perforation. No drainable fluid collection. Electronically Signed   By: Elgie Collard M.D.   On: 06/20/2020 16:30     A/P: 54 year old woman with diverticulitis with microperforation following several weeks of outpatient treatment for diverticulitis with waxing and waning course -Recommend bowel rest-n.p.o. except for medication -IV fluid resuscitation -Empiric broad-spectrum antibiotics (Zosyn) -Serial abdominal exams and serial labs.  Surgery team will continue to follow.  Discussed possibility of developing a drainable abscess, versus resolution with antibiotics alone, versus failure of medical therapy and possibly needing a segmental colon resection this admission with high risk of colostomy.  Questions welcomed and answered.    Patient Active Problem List   Diagnosis Date Noted  . Breast lump 02/21/2017  . Cyst of ovary 02/21/2017  . Lower abdominal pain 02/21/2017  . Obesity 02/21/2017  . Upper respiratory infection 02/21/2017       Phylliss Blakes, MD [facility]  See AMION to contact appropriate on-call provider

## 2020-06-20 NOTE — ED Triage Notes (Signed)
Pt reports she is here today due to her PCP wanting her to have a Ct scan. Pt reports she has h/o diverticulitis x2 month with unsuccessful outpatient treatment.

## 2020-06-20 NOTE — H&P (Signed)
History and Physical    Deborah Chaney ZOX:[id number] DOB: 10/31/1965 DOA: 06/20/2020  Referring MD/NP/PA: Janetta HoraPatel, PA-C PCP: Deatra JamesSun, Vyvyan, MD  Patient coming from: GI clinic  Chief Complaint: Abdominal pain  I have personally briefly reviewed patient's old medical [facility]   HPI: Deborah Chaney is a 54 y.o. female with medical history significant of hypertension and diverticulitis who presented with complaints left lower quadrant abdominal pain.  Initially diagnosed with mid sigmoid diverticulitis on November 8 at [facility] discharged home on a 10-day course of ciprofloxacin and Flagyl.  She initially had improvement in symptoms until after Thanksgiving.  She was seen back in the emergency department on December 5 where imaging revealed sigmoid colonic diverticulitis.  Patient was given 10-day course of Augmentin and referred to gastroenterology for colonoscopy.  She followed up in their clinic on 12/20, but was still noted to have some pain for which she was continued on Augmentin.  Yesterday, morning she woke up with severe left lower quadrant abdominal pain.  She describes it as someone trying to rip her intestines out of her body.  She could hardly move due to the pain.  When she was finally able to get up and go to the bathroom she felt really hot and sweaty and ended up on the floor.  She does not feel like she lost consciousness or sustaining any injury.  Patient laid on the floor until she was able to get back up eventually, but did not go to the hospital at that time [place]  Patient reported associated symptoms of intermittent subjective fever/chills, nausea, and loose stools.  Denies any blood in stools.  She called get an appointment to see the gastroenterologist this morning at 10:45 AM.  She was seen in the office and advised to come to the hospital for further evaluation.  ED Course: Upon admission into the  emergency department patient was found to be tachycardic with all other vital signs within normal limits.  Labs significant for WBC 14.4.  Lactic acid was ordered, but had not resulted yet.  CT imaging significant for sigmoid diverticulitis with microperforation.  Patient had been given 2.5 L of normal saline IV fluids, Zosyn, and morphine.  General surgery have been formally consulted.  TRH called to admit.  COVID-19 screening were still pending.  Review of Systems  Constitutional: Positive for chills, fever and malaise/fatigue.  HENT: Negative for ear discharge and nosebleeds.   Eyes: Negative for photophobia and pain.  Respiratory: Negative for cough and shortness of breath.   Cardiovascular: Negative for chest pain and leg swelling.  Gastrointestinal: Positive for abdominal pain and nausea. Negative for diarrhea and vomiting.  Genitourinary: Negative for dysuria and hematuria.  Musculoskeletal: Positive for falls. Negative for joint pain.  Skin: Negative for rash.  Neurological: Positive for weakness. Negative for focal weakness and loss of consciousness.  Psychiatric/Behavioral: Negative for memory loss and substance abuse.    Past Medical History:  Diagnosis Date  . Diverticulitis   . Hypertension   . Ovarian cyst     Past Surgical History:  Procedure Laterality Date  . ABDOMINAL HYSTERECTOMY    . APPENDECTOMY    . LAPAROSCOPIC OVARIAN CYSTECTOMY    . TUBAL LIGATION       reports that she has never smoked. She has never used smokeless tobacco. She reports that she does not drink alcohol and does not use drugs.  Allergies  Allergen  Reactions  . Penicillins Other (See Comments)    Head feels tight Face tightens up    Family History  Problem Relation Age of Onset  . Hypertension Mother   . Diabetes Mother   . Hypertension Father   . Diabetes Father     Prior to Admission medications   Medication Sig Start Date End Date Taking? Authorizing Provider  amLODipine  (NORVASC) 10 MG tablet Take 10 mg by mouth daily. 02/19/20   [provider]  amLODipine (NORVASC) 5 MG tablet Take 5 mg by mouth daily.    [provider]  amoxicillin-clavulanate (AUGMENTIN) 875-125 MG tablet Take 1 tablet by mouth every 12 (twelve) hours. 05/29/20   Zadie Rhine, MD  Ascorbic Acid (VITAMIN C) 100 MG tablet Take 100 mg by mouth daily.    [provider]  aspirin 81 MG tablet Take 81 mg by mouth daily.    [provider]  calcium carbonate (OS-CAL - DOSED IN MG OF ELEMENTAL CALCIUM) 1250 MG tablet Take 1 tablet by mouth daily.    [provider]  Calcium Carbonate-Vit D-Min (CALCIUM 1200 PO) Take by mouth.    [provider]  cholecalciferol (VITAMIN D) 1000 UNITS tablet Take 1,000 Units by mouth daily.    [provider]  diphenhydrAMINE (BENADRYL) 25 MG tablet Take 25 mg by mouth every 6 (six) hours as needed for itching. Reported on 06/16/2015    [provider]  diphenhydrAMINE (SOMINEX) 25 MG tablet Take by mouth.    [provider]  ergocalciferol (VITAMIN D2) 1.25 MG (50000 UT) capsule Take 50,000 Units by mouth once a week.    [provider]  gentamicin cream (GARAMYCIN) 0.1 % Apply 1 application topically 2 (two) times daily. 05/09/20   Felecia Shelling, DPM  Multiple Vitamin (MULTIVITAMIN) tablet Take 1 tablet by mouth daily. Reported on 06/16/2015    [provider]  NEOMYCIN-POLYMYXIN-HYDROCORTISONE (CORTISPORIN) 1 % SOLN OTIC solution Apply 1-2 drops to toe BID after soaking Patient taking differently: 1-2 drops See admin instructions. Apply 1-2 drops to toe BID after soaking 08/14/18   Hyatt, Max T, DPM  promethazine (PHENERGAN) 25 MG tablet Take 1 tablet (25 mg total) by mouth every 8 (eight) hours as needed for nausea or vomiting. 11/02/15 05/29/20  Elinor Parkinson, DPM    Physical Exam:  Constitutional: Middle-aged female who appears to be in no acute distress at  this Vitals:   06/20/20 1243 06/20/20 1454 06/20/20 1500 06/20/20 1600  BP: 131/87 133/82 138/77 130/77  Pulse: (!) 105 (!) 108 (!) 107 (!) 108  Resp: 18 18 17 19   Temp: 98 F (36.7 C) 99.1 F (37.3 C)    TempSrc: Oral Oral    SpO2: 97% 96% 96% 91%   Eyes: PERRL, lids and conjunctivae normal ENMT: Mucous membranes are dry. Posterior pharynx clear of any exudate or lesions.   Neck: normal, supple, no masses, no thyromegaly Respiratory: clear to auscultation bilaterally, no wheezing, no crackles. Normal respiratory effort. No accessory muscle use.  Cardiovascular: Regular rate and rhythm, no murmurs / rubs / gallops. No extremity edema. 2+ pedal pulses. No carotid bruits.  Abdomen: Moderate tenderness to palpation of the left lower quadrant Musculoskeletal: no clubbing / cyanosis. No joint deformity upper and lower extremities. Good ROM, no contractures. Normal muscle tone.  Skin: no rashes, lesions, ulcers. No induration Neurologic: CN 2-12 grossly intact. Sensation intact, DTR normal. Strength 5/5 in all 4.  Psychiatric: Normal judgment and insight.  Alert and oriented x 3. Normal mood.     Labs on Admission: I have personally reviewed following labs and imaging studies  CBC: Recent Labs  Lab 06/20/20 1257  WBC 14.4*  HGB 13.8  HCT 41.1  MCV 88.2  PLT 306   Basic Metabolic Panel: Recent Labs  Lab 06/20/20 1257  NA 136  K 3.6  CL 103  CO2 23  GLUCOSE 100*  BUN 8  CREATININE 0.90  CALCIUM 9.4   GFR: CrCl cannot be calculated (Unknown ideal weight.). Liver Function Tests: Recent Labs  Lab 06/20/20 1257  AST 16  ALT 17  ALKPHOS 91  BILITOT 1.3*  PROT 7.4  ALBUMIN 3.6   Recent Labs  Lab 06/20/20 1257  LIPASE 16   No results for input(s): AMMONIA in the last 168 hours. Coagulation Profile: No results for input(s): INR, PROTIME in the last 168 hours. Cardiac Enzymes: No results for input(s): CKTOTAL, CKMB, CKMBINDEX, TROPONINI in the last 168  hours. BNP (last 3 results) No results for input(s): PROBNP in the last 8760 hours. HbA1C: No results for input(s): HGBA1C in the last 72 hours. CBG: No results for input(s): GLUCAP in the last 168 hours. Lipid Profile: No results for input(s): CHOL, HDL, LDLCALC, TRIG, CHOLHDL, LDLDIRECT in the last 72 hours. Thyroid Function Tests: No results for input(s): TSH, T4TOTAL, FREET4, T3FREE, THYROIDAB in the last 72 hours. Anemia Panel: No results for input(s): VITAMINB12, FOLATE, FERRITIN, TIBC, IRON, RETICCTPCT in the last 72 hours. Urine analysis:    Component Value Date/Time   COLORURINE YELLOW 06/20/2020 1422   APPEARANCEUR CLEAR 06/20/2020 1422   LABSPEC 1.013 06/20/2020 1422   PHURINE 5.0 06/20/2020 1422   GLUCOSEU NEGATIVE 06/20/2020 1422   HGBUR SMALL (A) 06/20/2020 1422   BILIRUBINUR NEGATIVE 06/20/2020 1422   KETONESUR 20 (A) 06/20/2020 1422   PROTEINUR NEGATIVE 06/20/2020 1422   UROBILINOGEN 0.2 02/04/2014 0424   NITRITE NEGATIVE 06/20/2020 1422   LEUKOCYTESUR NEGATIVE 06/20/2020 1422   Sepsis Labs: No results found for this or any previous visit (from the past 240 hour(s)).   Radiological Exams on Admission: CT ABDOMEN PELVIS W CONTRAST  Result Date: 06/20/2020 CLINICAL DATA:  54 year old female with abdominal pain. Concern for acute diverticulitis. EXAM: CT ABDOMEN AND PELVIS WITH CONTRAST TECHNIQUE: Multidetector CT imaging of the abdomen and pelvis was performed using the standard protocol following bolus administration of intravenous contrast. CONTRAST:  OMNIPAQUE IOHEXOL 300 MG/ML  SOLN COMPARISON:  CT abdomen pelvis dated 05/29/2020. FINDINGS: Lower chest: The visualized lung bases are clear. No intra-abdominal free air. Trace free fluid in the pelvis. Hepatobiliary: Probable mild fatty liver. No intrahepatic biliary ductal dilatation. The gallbladder is unremarkable. Pancreas: Unremarkable. No pancreatic ductal dilatation or surrounding inflammatory changes.  Spleen: Several subcentimeter splenic hypodense lesions are too small to characterize. The spleen is otherwise unremarkable. Adrenals/Urinary Tract: The adrenal glands unremarkable. The kidneys, visualized ureters, and urinary bladder appear unremarkable. Stomach/Bowel: There is sigmoid diverticulosis. There is active inflammatory changes involving the distal descending/sigmoid junction consistent with acute diverticulitis. Small pockets of air adjacent to the sigmoid colon consistent with focally contained micro perforation. A 3.0 x 1.2 cm fluid adjacent to the sigmoid colon may represent inflammatory fluid. Developing abscess is not excluded. No drainable fluid collection identified at this time. There is diffuse colonic diverticula. There is no bowel obstruction. Appendectomy. Vascular/Lymphatic: The abdominal aorta and IVC are unremarkable. No portal venous gas. There is no adenopathy. Reproductive: Hysterectomy. No adnexal masses. Other: None Musculoskeletal:  No acute or significant osseous findings. IMPRESSION: Sigmoid diverticulitis with focally contained micro perforation. No drainable fluid collection. Electronically Signed   By: Elgie Collard M.D.   On: 06/20/2020 16:30     Assessment/Plan Sepsis 2/2 sigmoid diverticulitis with microperforation: Patient had recently failed outpatient treatment with Augmentin.  Presented with tachycardia and white blood cell count elevated at 14.4.  CT imaging showing sigmoid diverticulitis with focally contained microperforation.  General surgery has been formally consulted. -Admit to a telemetry bed -Follow-up blood culture -Clear liquid diet -Continue empiric antibiotics of Zosyn -Oxycodone/Dilaudid IV as needed for pain -Normal saline IV fluids  -Florastor -Trend lactic acid -Appreciate GI and general surgery consultative services,  will follow-up for further recommendations  Morbid obesity: BMI 41.63 kg/m2  DVT prophylaxis: Lovenox Code Status:  Full Family Communication: husband  updated over the phone Disposition Plan: Likely discharge home 30 days Consults called: GI and surgery Admission status: Inpatient, require more than 2 midnight stay for IV antibiotics given failed outpatient treatment  Clydie Braun MD [facility]   If 7PM-7AM, please contact night-coverage   06/20/2020, 5:03 PM

## 2020-06-20 NOTE — Progress Notes (Signed)
Following for code sepsis 

## 2020-06-21 ENCOUNTER — Other Ambulatory Visit: Payer: Self-pay

## 2020-06-21 LAB — BASIC METABOLIC PANEL
Anion gap: 9 (ref 5–15)
BUN: 5 mg/dL — ABNORMAL LOW (ref 6–20)
CO2: 22 mmol/L (ref 22–32)
Calcium: 8.3 mg/dL — ABNORMAL LOW (ref 8.9–10.3)
Chloride: 105 mmol/L (ref 98–111)
Creatinine, Ser: 0.96 mg/dL (ref 0.44–1.00)
GFR, Estimated: >60 mL/min (ref >60)
Glucose, Bld: 106 mg/dL — ABNORMAL HIGH (ref 70–99)
Potassium: 3.6 mmol/L (ref 3.5–5.1)
Sodium: 136 mmol/L (ref 135–145)

## 2020-06-21 LAB — CBC
HCT: 35 % — ABNORMAL LOW (ref 36.0–46.0)
Hemoglobin: 11.8 g/dL — ABNORMAL LOW (ref 12.0–15.0)
MCH: 29.9 pg (ref 26.0–34.0)
MCHC: 33.7 g/dL (ref 30.0–36.0)
MCV: 88.6 fL (ref 80.0–100.0)
Platelets: 251 10*3/uL (ref 150–400)
RBC: 3.95 MIL/uL (ref 3.87–5.11)
RDW: 13.2 % (ref 11.5–15.5)
WBC: 13.6 10*3/uL — ABNORMAL HIGH (ref 4.0–10.5)
nRBC: 0 % (ref 0.0–0.2)

## 2020-06-21 LAB — HIV ANTIBODY (ROUTINE TESTING W REFLEX): HIV Screen 4th Generation wRfx: NONREACTIVE

## 2020-06-21 LAB — LACTIC ACID, PLASMA: Lactic Acid, Venous: 0.7 mmol/L (ref 0.5–1.9)

## 2020-06-21 NOTE — Progress Notes (Signed)
Surgery note and recommendations reviewed.  Patient thinks she is about "50%" improved compared to yesterday.  White count is slightly improved.  She is still moderately tender on exam.  No new recommendations; will monitor progress on current therapy and defer to general surgery regarding strategy from here.  Florencia Reasons, M.D. Pager [phone] If no answer or after 5 PM call [phone]

## 2020-06-21 NOTE — Progress Notes (Signed)
PROGRESS NOTE    Deborah Chaney  UYQ:[id number] DOB: 07/10/1965 DOA: 06/20/2020 PCP: Deatra James, MD    Brief Narrative:  Deborah Chaney is a 54 y.o. female with medical history significant of hypertension and diverticulitis who presented with complaints left lower quadrant abdominal pain.  Initially diagnosed with mid sigmoid diverticulitis .  She returns with severe abdominal pain, had CT scan revealing significant for sigmoid diverticulitis with microperforation.  General surgery consult    Consultants:   General surgery GI Procedures: CT  Antimicrobials:   Zosyn 12/27   Subjective: Her pain controlled as long as she does not m0ve in bed. No n/v.   Objective: Vitals:   06/21/20 0130 06/21/20 0200 06/21/20 0237 06/21/20 0522  BP: 117/71 114/79 114/79 115/77  Pulse: (!) 104 100 94 92  Resp: 19 19 18 16   Temp:   98.6 F (37 C) 98.4 F (36.9 C)  TempSrc:   Oral Oral  SpO2: 94% 93% 94% 94%  Weight:      Height:        Intake/Output Summary (Last 24 hours) at 06/21/2020 1204 Last data filed at 06/20/2020 2132 Gross per 24 hour  Intake 2550 ml  Output --  Net 2550 ml   Filed Weights   06/20/20 1700  Weight: 110 kg    Examination:  General exam: Appears calm and comfortable, NAD lying in bed Respiratory system: Clear to auscultation. Respiratory effort normal. Cardiovascular system: S1 & S2 heard, RRR. No JVD, murmurs, rubs, gallops or clicks. No pedal edema. Gastrointestinal system: Abdomen is nondistended, TTP LLQ, positive bowel sounds Central nervous system: Alert and oriented. No focal neurological deficits. Extremities: No edema Skin: Warm dry Psychiatry: Judgement and insight appear normal. Mood & affect appropriate.     Data Reviewed: I have personally reviewed following labs and imaging studies  CBC: Recent Labs  Lab 06/20/20 1257 06/21/20 0319  WBC 14.4* 13.6*  HGB 13.8 11.8*  HCT 41.1 35.0*  MCV 88.2 88.6  PLT 306 251    Basic Metabolic Panel: Recent Labs  Lab 06/20/20 1257 06/21/20 0319  NA 136 136  K 3.6 3.6  CL 103 105  CO2 23 22  GLUCOSE 100* 106*  BUN 8 5*  CREATININE 0.90 0.96  CALCIUM 9.4 8.3*   GFR: Estimated Creatinine Clearance: 80.3 mL/min (by C-G formula based on SCr of 0.96 mg/dL). Liver Function Tests: Recent Labs  Lab 06/20/20 1257  AST 16  ALT 17  ALKPHOS 91  BILITOT 1.3*  PROT 7.4  ALBUMIN 3.6   Recent Labs  Lab 06/20/20 1257  LIPASE 16   No results for input(s): AMMONIA in the last 168 hours. Coagulation Profile: No results for input(s): INR, PROTIME in the last 168 hours. Cardiac Enzymes: No results for input(s): CKTOTAL, CKMB, CKMBINDEX, TROPONINI in the last 168 hours. BNP (last 3 results) No results for input(s): PROBNP in the last 8760 hours. HbA1C: No results for input(s): HGBA1C in the last 72 hours. CBG: No results for input(s): GLUCAP in the last 168 hours. Lipid Profile: No results for input(s): CHOL, HDL, LDLCALC, TRIG, CHOLHDL, LDLDIRECT in the last 72 hours. Thyroid Function Tests: No results for input(s): TSH, T4TOTAL, FREET4, T3FREE, THYROIDAB in the last 72 hours. Anemia Panel: No results for input(s): VITAMINB12, FOLATE, FERRITIN, TIBC, IRON, RETICCTPCT in the last 72 hours. Sepsis Labs: Recent Labs  Lab 06/20/20 1645 06/21/20 0319  LATICACIDVEN 0.9 0.7    Recent Results (from the past 240 hour(s))  SARS  CORONAVIRUS 2 (TAT 6-24 HRS) Nasopharyngeal Nasopharyngeal Swab     Status: None   Collection Time: 06/20/20  3:47 PM   Specimen: Nasopharyngeal Swab  Result Value Ref Range Status   SARS Coronavirus 2 NEGATIVE NEGATIVE Final    Comment: (NOTE) SARS-CoV-2 target nucleic acids are NOT DETECTED.  The SARS-CoV-2 RNA is generally detectable in upper and lower respiratory specimens during the acute phase of infection. Negative results do not preclude SARS-CoV-2 infection, do not rule out co-infections with other pathogens, and  should not be used as the sole basis for treatment or other patient management decisions. Negative results must be combined with clinical observations, patient history, and epidemiological information. The expected result is Negative.  Fact Sheet for Patients: HairSlick.no  Fact Sheet for Healthcare Providers: quierodirigir.com  This test is not yet approved or cleared by the [place] FDA and  has been authorized for detection and/or diagnosis of SARS-CoV-2 by FDA under an Emergency Use Authorization (EUA). This EUA will remain  in effect (meaning this test can be used) for the duration of the COVID-19 declaration under Se ction 564(b)(1) of the Act, 21 U.S.C. section 360bbb-3(b)(1), unless the authorization is terminated or revoked sooner.  Performed at [facility], [address], [place], [place] [address]          Radiology Studies: CT ABDOMEN PELVIS W CONTRAST  Result Date: 06/20/2020 CLINICAL DATA:  54 year old female with abdominal pain. Concern for acute diverticulitis. EXAM: CT ABDOMEN AND PELVIS WITH CONTRAST TECHNIQUE: Multidetector CT imaging of the abdomen and pelvis was performed using the standard protocol following bolus administration of intravenous contrast. CONTRAST:  OMNIPAQUE IOHEXOL 300 MG/ML  SOLN COMPARISON:  CT abdomen pelvis dated 05/29/2020. FINDINGS: Lower chest: The visualized lung bases are clear. No intra-abdominal free air. Trace free fluid in the pelvis. Hepatobiliary: Probable mild fatty liver. No intrahepatic biliary ductal dilatation. The gallbladder is unremarkable. Pancreas: Unremarkable. No pancreatic ductal dilatation or surrounding inflammatory changes. Spleen: Several subcentimeter splenic hypodense lesions are too small to characterize. The spleen is otherwise unremarkable. Adrenals/Urinary Tract: The adrenal glands unremarkable. The kidneys, visualized ureters, and  urinary bladder appear unremarkable. Stomach/Bowel: There is sigmoid diverticulosis. There is active inflammatory changes involving the distal descending/sigmoid junction consistent with acute diverticulitis. Small pockets of air adjacent to the sigmoid colon consistent with focally contained micro perforation. A 3.0 x 1.2 cm fluid adjacent to the sigmoid colon may represent inflammatory fluid. Developing abscess is not excluded. No drainable fluid collection identified at this time. There is diffuse colonic diverticula. There is no bowel obstruction. Appendectomy. Vascular/Lymphatic: The abdominal aorta and IVC are unremarkable. No portal venous gas. There is no adenopathy. Reproductive: Hysterectomy. No adnexal masses. Other: None Musculoskeletal: No acute or significant osseous findings. IMPRESSION: Sigmoid diverticulitis with focally contained micro perforation. No drainable fluid collection. Electronically Signed   By: Elgie Collard M.D.   On: 06/20/2020 16:30        Scheduled Meds: . enoxaparin (LOVENOX) injection  40 mg Subcutaneous Q24H  . saccharomyces boulardii  250 mg Oral BID  . sodium chloride flush  3 mL Intravenous Q12H   Continuous Infusions: . sodium chloride 125 mL/hr at 06/21/20 1026  . methocarbamol (ROBAXIN) IV    . piperacillin-tazobactam (ZOSYN)  IV 3.375 g (06/21/20 0531)    Assessment & Plan:   Principal Problem:   Sepsis (HCC) Active Problems:   Diverticulitis of colon with perforation   Obesity, Class III, BMI 40-49.9 (morbid obesity) (HCC)  Sigmoid diverticulitis with microperforation  - last colonoscopy ~2016 Was seen in GI clinic for sigmoid. Diverti.  yesterday and sent to ER due to concerns for her sx/s.  -CT also with a 3.0 x 1.2 cm fluid adjacent to the sigmoid colon may represent inflammatory fluid. Developing abscess is not excluded. --Surgery following.  Would like to try medical management. NPO , bowel rest. IV abx. Hopefully this will  resolve. If she does not improve, there may be a possibility needing a segmental colon resection this admission with high risk of colostomy. If she improves with acute surgery plan for outpatient follow-up with colorectal surgery to discuss elective colectomy Plan: Continue iv abx , bowel rest, check a.m. labs Continue IV fluids Pain management  Sepsis -2/2 sigmoid diverticulitis with microperforation:  Patient presented with leukocytosis, tachycardia  Lactic acid normal Follow-up blood cultures Continue IV antibiotics      DVT prophylaxis: Lovenox Code Status: Full Family Communication: None at bedside  Status is: Inpatient  Remains inpatient appropriate because:IV treatments appropriate due to intensity of illness or inability to take PO   Dispo: The patient is from: Home              Anticipated d/c is to: Home              Anticipated d/c date is: 2 days              Patient currently is not medically stable to d/c.            LOS: 1 day   Time spent: 35 minutes with more than 50% on COC    Lynn Ito, MD [facility] Pager [phone]  If 7PM-7AM, please contact night-coverage 06/21/2020, 12:04 PM

## 2020-06-21 NOTE — Progress Notes (Signed)
Pharmacy Antibiotic Note  Deborah Chaney is a 54 y.o. female admitted on 06/20/2020 with sigmoid diverticulitis.  Pharmacy has been consulted for zosyn dosing.  Pt was admitted for abx pain with diverticulitis. CCS involved for possible surgery.  Scr<1  Plan: Zosyn 3.375g IV q8  Height: 5\' 4"  (162.6 cm) Weight: 110 kg (242 lb 8.1 oz) IBW/kg (Calculated) : 54.7  Temp (24hrs), Avg:98.7 F (37.1 C), Min:98 F (36.7 C), Max:99.6 F (37.6 C)  Recent Labs  Lab 06/20/20 1257 06/20/20 1645 06/21/20 0319  WBC 14.4*  --  13.6*  CREATININE 0.90  --  0.96  LATICACIDVEN  --  0.9 0.7    Estimated Creatinine Clearance: 80.3 mL/min (by C-G formula based on SCr of 0.96 mg/dL).    Allergies  Allergen Reactions  . Penicillins Other (See Comments)    Head feels tight Face tightens up  . Shrimp (Diagnostic) Itching    Throat itching    Antimicrobials this admission: 12/27 blood>>  Dose adjustments this admission:   Microbiology results: 12/27 blood>>  1/28, PharmD, [place], AAHIVP, CPP Infectious Disease Pharmacist 06/21/2020 8:33 AM

## 2020-06-21 NOTE — Progress Notes (Signed)
[facility] Progress Note     Subjective: CC-  Still having some LLQ pain but it is improved from yesterday. Denies n/v.  WBC down 13.6 from 14.4, TMAX 99.6.  Objective: Vital signs in last 24 hours: Temp:  [98 F (36.7 C)-99.6 F (37.6 C)] 98.4 F (36.9 C) (12/28 0522) Pulse Rate:  [92-121] 92 (12/28 0522) Resp:  [16-27] 16 (12/28 0522) BP: (112-138)/(67-87) 115/77 (12/28 0522) SpO2:  [91 %-97 %] 94 % (12/28 0522) Weight:  [110 kg] 110 kg (12/27 1700) Last BM Date: 06/22/20  Intake/Output from previous day: 12/27 0701 - 12/28 0700 In: 2550 [IV Piggyback:2550] Out: -  Intake/Output this shift: No intake/output data recorded.  PE: Gen:  Alert, NAD, pleasant HEENT: EOM's intact, pupils equal and round Card:  RRR Pulm: rate and effort normal on room air Abd: Soft, obese, ND, +BS, no HSM, TTP LLQ>LUQ with voluntary guarding Psych: A&Ox4  Skin: no rashes noted, warm and dry  Lab Results:  Recent Labs    06/20/20 1257 06/21/20 0319  WBC 14.4* 13.6*  HGB 13.8 11.8*  HCT 41.1 35.0*  PLT 306 251   BMET Recent Labs    06/20/20 1257 06/21/20 0319  NA 136 136  K 3.6 3.6  CL 103 105  CO2 23 22  GLUCOSE 100* 106*  BUN 8 5*  CREATININE 0.90 0.96  CALCIUM 9.4 8.3*   PT/INR No results for input(s): LABPROT, INR in the last 72 hours. CMP     Component Value Date/Time   NA 136 06/21/2020 0319   K 3.6 06/21/2020 0319   CL 105 06/21/2020 0319   CO2 22 06/21/2020 0319   GLUCOSE 106 (H) 06/21/2020 0319   BUN 5 (L) 06/21/2020 0319   CREATININE 0.96 06/21/2020 0319   CALCIUM 8.3 (L) 06/21/2020 0319   PROT 7.4 06/20/2020 1257   ALBUMIN 3.6 06/20/2020 1257   AST 16 06/20/2020 1257   ALT 17 06/20/2020 1257   ALKPHOS 91 06/20/2020 1257   BILITOT 1.3 (H) 06/20/2020 1257   GFRNONAA >60 06/21/2020 0319   GFRAA >60 02/10/2019 0933   Lipase     Component Value Date/Time   LIPASE 16 06/20/2020 1257       Studies/Results: CT ABDOMEN PELVIS W  CONTRAST  Result Date: 06/20/2020 CLINICAL DATA:  54 year old female with abdominal pain. Concern for acute diverticulitis. EXAM: CT ABDOMEN AND PELVIS WITH CONTRAST TECHNIQUE: Multidetector CT imaging of the abdomen and pelvis was performed using the standard protocol following bolus administration of intravenous contrast. CONTRAST:  OMNIPAQUE IOHEXOL 300 MG/ML  SOLN COMPARISON:  CT abdomen pelvis dated 05/29/2020. FINDINGS: Lower chest: The visualized lung bases are clear. No intra-abdominal free air. Trace free fluid in the pelvis. Hepatobiliary: Probable mild fatty liver. No intrahepatic biliary ductal dilatation. The gallbladder is unremarkable. Pancreas: Unremarkable. No pancreatic ductal dilatation or surrounding inflammatory changes. Spleen: Several subcentimeter splenic hypodense lesions are too small to characterize. The spleen is otherwise unremarkable. Adrenals/Urinary Tract: The adrenal glands unremarkable. The kidneys, visualized ureters, and urinary bladder appear unremarkable. Stomach/Bowel: There is sigmoid diverticulosis. There is active inflammatory changes involving the distal descending/sigmoid junction consistent with acute diverticulitis. Small pockets of air adjacent to the sigmoid colon consistent with focally contained micro perforation. A 3.0 x 1.2 cm fluid adjacent to the sigmoid colon may represent inflammatory fluid. Developing abscess is not excluded. No drainable fluid collection identified at this time. There is diffuse colonic diverticula. There is no bowel obstruction. Appendectomy. Vascular/Lymphatic: The abdominal  aorta and IVC are unremarkable. No portal venous gas. There is no adenopathy. Reproductive: Hysterectomy. No adnexal masses. Other: None Musculoskeletal: No acute or significant osseous findings. IMPRESSION: Sigmoid diverticulitis with focally contained micro perforation. No drainable fluid collection. Electronically Signed   By: Elgie Collard M.D.   On:  06/20/2020 16:30    Anti-infectives: Anti-infectives (From admission, onward)   Start     Dose/Rate Route Frequency Ordered Stop   06/20/20 2200  piperacillin-tazobactam (ZOSYN) IVPB 3.375 g  Status:  Discontinued        3.375 g 100 mL/hr over 30 Minutes Intravenous Every 8 hours 06/20/20 1715 06/20/20 1718   06/20/20 2130  piperacillin-tazobactam (ZOSYN) IVPB 3.375 g        3.375 g 12.5 mL/hr over 240 Minutes Intravenous Every 8 hours 06/20/20 1719     06/20/20 1530  piperacillin-tazobactam (ZOSYN) IVPB 3.375 g        3.375 g 100 mL/hr over 30 Minutes Intravenous  Once 06/20/20 1517 06/20/20 1641       Assessment/Plan Obesity BMI 41.63 HTN  Sigmoid diverticulitis with microperforation  - last colonoscopy ~2016 - CT 12/27 shows sigmoid diverticulitis with focally contained micro perforation, no drainable fluid collection; a 3.0 x 1.2 cm fluid adjacent to the sigmoid colon may represent inflammatory fluid or developing abscess  - Hopefully this will resolve with medical management. Discussed possibility of developing a drainable abscess, versus resolution with antibiotics alone, versus failure of medical therapy and possibly needing a segmental colon resection this admission with high risk of colostomy. If she improves with acute surgery plan for outpatient follow up with colorectal surgeon to discuss elective colectomy  ID - zosyn 12/27>> FEN - IVF, NPO sips/chips VTE - SCDs, lovenox Foley - none Follow up - TBD  Plan: Continue IV zosyn and bowel rest today. Repeat labs in AM.     LOS: 1 day    Franne Forts, [facility] 06/21/2020, 8:45 AM Please see Amion for pager number during day hours 7:00am-4:30pm

## 2020-06-22 DIAGNOSIS — R1032 Left lower quadrant pain: Secondary | ICD-10-CM

## 2020-06-22 LAB — BASIC METABOLIC PANEL
Anion gap: 8 (ref 5–15)
BUN: 5 mg/dL — ABNORMAL LOW (ref 6–20)
CO2: 22 mmol/L (ref 22–32)
Calcium: 8.3 mg/dL — ABNORMAL LOW (ref 8.9–10.3)
Chloride: 107 mmol/L (ref 98–111)
Creatinine, Ser: 0.88 mg/dL (ref 0.44–1.00)
GFR, Estimated: >60 mL/min (ref >60)
Glucose, Bld: 80 mg/dL (ref 70–99)
Potassium: 3.6 mmol/L (ref 3.5–5.1)
Sodium: 137 mmol/L (ref 135–145)

## 2020-06-22 LAB — CBC
HCT: 36.5 % (ref 36.0–46.0)
Hemoglobin: 11.8 g/dL — ABNORMAL LOW (ref 12.0–15.0)
MCH: 28.9 pg (ref 26.0–34.0)
MCHC: 32.3 g/dL (ref 30.0–36.0)
MCV: 89.5 fL (ref 80.0–100.0)
Platelets: 244 10*3/uL (ref 150–400)
RBC: 4.08 MIL/uL (ref 3.87–5.11)
RDW: 12.8 % (ref 11.5–15.5)
WBC: 11 10*3/uL — ABNORMAL HIGH (ref 4.0–10.5)
nRBC: 0 % (ref 0.0–0.2)

## 2020-06-22 MED ORDER — MELATONIN 3 MG PO TABS
3.0000 mg | ORAL_TABLET | Freq: Every evening | ORAL | Status: DC | PRN
  Administered 2020-06-22 – 2020-06-25 (×3): 3 mg via ORAL
  Filled 2020-06-22 (×4): qty 1

## 2020-06-22 NOTE — Progress Notes (Addendum)
[facility] Progress Note     Subjective: CC-  Still having some LLQ pain but it is improved from yesterday. Rates pain as 3/10. Denies n/v. BM yesterday. Some pain with defecation.  WBC down 11, TMAX 99.6  Objective: Vital signs in last 24 hours: Temp:  [97.9 F (36.6 C)-99.6 F (37.6 C)] 99.6 F (37.6 C) (12/29 0543) Pulse Rate:  [90-102] 101 (12/29 0543) Resp:  [16-18] 18 (12/29 0543) BP: (112-127)/(66-75) 126/72 (12/29 0543) SpO2:  [94 %-98 %] 98 % (12/29 0543) Last BM Date: 06/20/20  Intake/Output from previous day: 12/28 0701 - 12/29 0700 In: 2050 [I.V.:2000; IV Piggyback:50] Out: -  Intake/Output this shift: No intake/output data recorded.  PE: Gen:  Alert, NAD, pleasant HEENT: EOM's intact, pupils equal and round Card:  RRR Pulm: rate and effort normal on room air Abd: Soft, obese, ND, +BS, no HSM, TTP LLQ with voluntary guarding Psych: A&Ox4  Skin: no rashes noted, warm and dry   Lab Results:  Recent Labs    06/21/20 0319 06/22/20 0107  WBC 13.6* 11.0*  HGB 11.8* 11.8*  HCT 35.0* 36.5  PLT 251 244   BMET Recent Labs    06/21/20 0319 06/22/20 0107  NA 136 137  K 3.6 3.6  CL 105 107  CO2 22 22  GLUCOSE 106* 80  BUN 5* 5*  CREATININE 0.96 0.88  CALCIUM 8.3* 8.3*   PT/INR No results for input(s): LABPROT, INR in the last 72 hours. CMP     Component Value Date/Time   NA 137 06/22/2020 0107   K 3.6 06/22/2020 0107   CL 107 06/22/2020 0107   CO2 22 06/22/2020 0107   GLUCOSE 80 06/22/2020 0107   BUN 5 (L) 06/22/2020 0107   CREATININE 0.88 06/22/2020 0107   CALCIUM 8.3 (L) 06/22/2020 0107   PROT 7.4 06/20/2020 1257   ALBUMIN 3.6 06/20/2020 1257   AST 16 06/20/2020 1257   ALT 17 06/20/2020 1257   ALKPHOS 91 06/20/2020 1257   BILITOT 1.3 (H) 06/20/2020 1257   GFRNONAA >60 06/22/2020 0107   GFRAA >60 02/10/2019 0933   Lipase     Component Value Date/Time   LIPASE 16 06/20/2020 1257       Studies/Results: CT ABDOMEN  PELVIS W CONTRAST  Result Date: 06/20/2020 CLINICAL DATA:  54 year old female with abdominal pain. Concern for acute diverticulitis. EXAM: CT ABDOMEN AND PELVIS WITH CONTRAST TECHNIQUE: Multidetector CT imaging of the abdomen and pelvis was performed using the standard protocol following bolus administration of intravenous contrast. CONTRAST:  OMNIPAQUE IOHEXOL 300 MG/ML  SOLN COMPARISON:  CT abdomen pelvis dated 05/29/2020. FINDINGS: Lower chest: The visualized lung bases are clear. No intra-abdominal free air. Trace free fluid in the pelvis. Hepatobiliary: Probable mild fatty liver. No intrahepatic biliary ductal dilatation. The gallbladder is unremarkable. Pancreas: Unremarkable. No pancreatic ductal dilatation or surrounding inflammatory changes. Spleen: Several subcentimeter splenic hypodense lesions are too small to characterize. The spleen is otherwise unremarkable. Adrenals/Urinary Tract: The adrenal glands unremarkable. The kidneys, visualized ureters, and urinary bladder appear unremarkable. Stomach/Bowel: There is sigmoid diverticulosis. There is active inflammatory changes involving the distal descending/sigmoid junction consistent with acute diverticulitis. Small pockets of air adjacent to the sigmoid colon consistent with focally contained micro perforation. A 3.0 x 1.2 cm fluid adjacent to the sigmoid colon may represent inflammatory fluid. Developing abscess is not excluded. No drainable fluid collection identified at this time. There is diffuse colonic diverticula. There is no bowel obstruction. Appendectomy. Vascular/Lymphatic: The  abdominal aorta and IVC are unremarkable. No portal venous gas. There is no adenopathy. Reproductive: Hysterectomy. No adnexal masses. Other: None Musculoskeletal: No acute or significant osseous findings. IMPRESSION: Sigmoid diverticulitis with focally contained micro perforation. No drainable fluid collection. Electronically Signed   By: Elgie Collard M.D.    On: 06/20/2020 16:30    Anti-infectives: Anti-infectives (From admission, onward)   Start     Dose/Rate Route Frequency Ordered Stop   06/20/20 2200  piperacillin-tazobactam (ZOSYN) IVPB 3.375 g  Status:  Discontinued        3.375 g 100 mL/hr over 30 Minutes Intravenous Every 8 hours 06/20/20 1715 06/20/20 1718   06/20/20 2130  piperacillin-tazobactam (ZOSYN) IVPB 3.375 g        3.375 g 12.5 mL/hr over 240 Minutes Intravenous Every 8 hours 06/20/20 1719     06/20/20 1530  piperacillin-tazobactam (ZOSYN) IVPB 3.375 g        3.375 g 100 mL/hr over 30 Minutes Intravenous  Once 06/20/20 1517 06/20/20 1641       Assessment/Plan Obesity BMI 41.63 HTN  Sigmoid diverticulitis with microperforation  - last colonoscopy ~2016 - CT 12/27 shows sigmoid diverticulitis with focally contained micro perforation, no drainable fluid collection; a 3.0 x 1.2 cm fluid adjacent to the sigmoid colon may represent inflammatory fluid or developing abscess  - Hopefully this will resolve with medical management. Discussed possibility of developing a drainable abscess, versus resolution with antibiotics alone, versus failure of medical therapy and possibly needing a segmental colon resection this admission with high risk of colostomy. If she improves with acute surgery plan for outpatient follow up with colorectal surgeon to discuss elective colectomy  ID - zosyn 12/27>> FEN - IVF, CLD VTE - SCDs, lovenox Foley - none Follow up - TBD  Plan: Ok for clear liquids. Continue IV zosyn. Mobilize. Repeat labs in AM. If she has persistent tenderness and/or leukocytosis likely will repeat CT scan in a couple days.   LOS: 2 days    Franne Forts, [facility] 06/22/2020, 8:46 AM Please see Amion for pager number during day hours 7:00am-4:30pm

## 2020-06-22 NOTE — Progress Notes (Signed)
Subjective: Patient states her pain fluctuates and stays around 5 out of 10 in intensity. She was able to get out of bed to chair for 45 minutes and walk around in the hallways. Her diet has been advanced to clear liquid by surgical team. She denies nausea or vomiting. She has not had a bowel movement but has been passing some flatus.  Objective: Vital signs in last 24 hours: Temp:  [97.9 F (36.6 C)-99.6 F (37.6 C)] 99.6 F (37.6 C) (12/29 0543) Pulse Rate:  [90-102] 101 (12/29 0543) Resp:  [16-18] 18 (12/29 0543) BP: (112-127)/(66-75) 126/72 (12/29 0543) SpO2:  [94 %-98 %] 98 % (12/29 0543) Weight change:  Last BM Date: 06/20/20  PE: Overweight, no obvious pallor, no obvious icterus GENERAL: Not in distress, able to speak in full sentences ABDOMEN: Mild left lower quadrant tenderness, sluggish but active bowel sounds EXTREMITIES: No deformity  Lab Results: Results for orders placed or performed during the hospital encounter of 06/20/20 (from the past 48 hour(s))  Lipase, blood     Status: None   Collection Time: 06/20/20 12:57 PM  Result Value Ref Range   Lipase 16 11 - 51 U/L    Comment: Performed at [facility], [address], [place], [place] [address]  Comprehensive metabolic panel     Status: Abnormal   Collection Time: 06/20/20 12:57 PM  Result Value Ref Range   Sodium 136 135 - 145 mmol/L   Potassium 3.6 3.5 - 5.1 mmol/L   Chloride 103 98 - 111 mmol/L   CO2 23 22 - 32 mmol/L   Glucose, Bld 100 (H) 70 - 99 mg/dL    Comment: Glucose reference range applies only to samples taken after fasting for at least 8 hours.   BUN 8 6 - 20 mg/dL   Creatinine, Ser 7.51 0.44 - 1.00 mg/dL   Calcium 9.4 8.9 - 02.5 mg/dL   Total Protein 7.4 6.5 - 8.1 g/dL   Albumin 3.6 3.5 - 5.0 g/dL   AST 16 15 - 41 U/L   ALT 17 0 - 44 U/L   Alkaline Phosphatase 91 38 - 126 U/L   Total Bilirubin 1.3 (H) 0.3 - 1.2 mg/dL   GFR, Estimated >85 >27 mL/min    Comment: (NOTE) Calculated  using the CKD-EPI Creatinine Equation (2021)    Anion gap 10 5 - 15    Comment: Performed at [facility], [address], [place], [place] [address]  CBC     Status: Abnormal   Collection Time: 06/20/20 12:57 PM  Result Value Ref Range   WBC 14.4 (H) 4.0 - 10.5 K/uL   RBC 4.66 3.87 - 5.11 MIL/uL   Hemoglobin 13.8 12.0 - 15.0 g/dL   HCT 35.3 61.4 - 43.1 %   MCV 88.2 80.0 - 100.0 fL   MCH 29.6 26.0 - 34.0 pg   MCHC 33.6 30.0 - 36.0 g/dL   RDW 54.0 08.6 - 76.1 %   Platelets 306 150 - 400 K/uL   nRBC 0.0 0.0 - 0.2 %    Comment: Performed at [facility], [address], [place], [place] [address]  Urinalysis, Routine w reflex microscopic Urine, Clean Catch     Status: Abnormal   Collection Time: 06/20/20  2:22 PM  Result Value Ref Range   Color, Urine YELLOW YELLOW   APPearance CLEAR CLEAR   Specific Gravity, Urine 1.013 1.005 - 1.030   pH 5.0 5.0 - 8.0  Glucose, UA NEGATIVE NEGATIVE mg/dL   Hgb urine dipstick SMALL (A) NEGATIVE   Bilirubin Urine NEGATIVE NEGATIVE   Ketones, ur 20 (A) NEGATIVE mg/dL   Protein, ur NEGATIVE NEGATIVE mg/dL   Nitrite NEGATIVE NEGATIVE   Leukocytes,Ua NEGATIVE NEGATIVE   RBC / HPF 0-5 0 - 5 RBC/hpf   WBC, UA 0-5 0 - 5 WBC/hpf   Bacteria, UA NONE SEEN NONE SEEN   Squamous Epithelial / LPF 0-5 0 - 5   Mucus PRESENT     Comment: Performed at [facility], [address] [place], [place] [address]  SARS CORONAVIRUS 2 (TAT 6-24 HRS) Nasopharyngeal Nasopharyngeal Swab     Status: None   Collection Time: 06/20/20  3:47 PM   Specimen: Nasopharyngeal Swab  Result Value Ref Range   SARS Coronavirus 2 NEGATIVE NEGATIVE    Comment: (NOTE) SARS-CoV-2 target nucleic acids are NOT DETECTED.  The SARS-CoV-2 RNA is generally detectable in upper and lower respiratory specimens during the acute phase of infection. Negative results do not preclude SARS-CoV-2 infection, do not rule out co-infections with other pathogens, and should not be used  as the sole basis for treatment or other patient management decisions. Negative results must be combined with clinical observations, patient history, and epidemiological information. The expected result is Negative.  Fact Sheet for Patients: HairSlick.nohttps://www.fda.gov/media/138098/download  Fact Sheet for Healthcare Providers: quierodirigir.comhttps://www.fda.gov/media/138095/download  This test is not yet approved or cleared by the [place] FDA and  has been authorized for detection and/or diagnosis of SARS-CoV-2 by FDA under an Emergency Use Authorization (EUA). This EUA will remain  in effect (meaning this test can be used) for the duration of the COVID-19 declaration under Se ction 564(b)(1) of the Act, 21 U.S.C. section 360bbb-3(b)(1), unless the authorization is terminated or revoked sooner.  Performed at [facility], [address] [place], [place] [address]   Lactic acid, plasma     Status: None   Collection Time: 06/20/20  4:45 PM  Result Value Ref Range   Lactic Acid, Venous 0.9 0.5 - 1.9 mmol/L    Comment: Performed at [facility], [address] [place], [place] [address]  Blood culture (routine x 2)     Status: None (Preliminary result)   Collection Time: 06/20/20  5:18 PM   Specimen: BLOOD  Result Value Ref Range   Specimen Description BLOOD LEFT ANTECUBITAL    Special Requests      BOTTLES DRAWN AEROBIC AND ANAEROBIC Blood Culture adequate volume   Culture      NO GROWTH 2 DAYS Performed at [facility] [place] [facility], [address] [place], [place] [address]    Report Status PENDING   Blood culture (routine x 2)     Status: None (Preliminary result)   Collection Time: 06/20/20  6:21 PM   Specimen: BLOOD  Result Value Ref Range   Specimen Description BLOOD SITE NOT SPECIFIED    Special Requests      BOTTLES DRAWN AEROBIC AND ANAEROBIC Blood Culture adequate volume   Culture      NO GROWTH 2 DAYS Performed at [facility], [address] [place],  [place] [address]    Report Status PENDING   HIV Antibody (routine testing w rflx)     Status: None   Collection Time: 06/20/20  6:21 PM  Result Value Ref Range   HIV Screen 4th Generation wRfx Non Reactive Non Reactive    Comment: (NOTE) Performed At: [facility]  [facility] [address] [place], [place] [id number] Jolene Schimke MD FU:[id number]   Lactic acid, plasma     Status: None   Collection Time: 06/21/20  3:19 AM  Result Value Ref Range   Lactic Acid, Venous 0.7 0.5 - 1.9 mmol/L    Comment: Performed at [facility], [address], [place], [place] [address]  Basic metabolic panel     Status: Abnormal   Collection Time: 06/21/20  3:19 AM  Result Value Ref Range   Sodium 136 135 - 145 mmol/L   Potassium 3.6 3.5 - 5.1 mmol/L   Chloride 105 98 - 111 mmol/L   CO2 22 22 - 32 mmol/L   Glucose, Bld 106 (H) 70 - 99 mg/dL    Comment: Glucose reference range applies only to samples taken after fasting for at least 8 hours.   BUN 5 (L) 6 - 20 mg/dL   Creatinine, Ser 7.06 0.44 - 1.00 mg/dL   Calcium 8.3 (L) 8.9 - 10.3 mg/dL   GFR, Estimated >23 >76 mL/min    Comment: (NOTE) Calculated using the CKD-EPI Creatinine Equation (2021)    Anion gap 9 5 - 15    Comment: Performed at [facility], [address] [place], [place] [address]  CBC     Status: Abnormal   Collection Time: 06/21/20  3:19 AM  Result Value Ref Range   WBC 13.6 (H) 4.0 - 10.5 K/uL   RBC 3.95 3.87 - 5.11 MIL/uL   Hemoglobin 11.8 (L) 12.0 - 15.0 g/dL   HCT 17.6 (L) 16.0 - 73.7 %   MCV 88.6 80.0 - 100.0 fL   MCH 29.9 26.0 - 34.0 pg   MCHC 33.7 30.0 - 36.0 g/dL   RDW 10.6 26.9 - 48.5 %   Platelets 251 150 - 400 K/uL   nRBC 0.0 0.0 - 0.2 %    Comment: Performed at [facility], [address] [place], [place] [address]  CBC     Status: Abnormal   Collection Time: 06/22/20  1:07 AM  Result Value Ref Range   WBC 11.0 (H) 4.0 - 10.5 K/uL   RBC 4.08 3.87 - 5.11 MIL/uL   Hemoglobin 11.8 (L) 12.0 -  15.0 g/dL   HCT 35.0 09.3 - 81.8 %   MCV 89.5 80.0 - 100.0 fL   MCH 28.9 26.0 - 34.0 pg   MCHC 32.3 30.0 - 36.0 g/dL   RDW 29.9 37.1 - 69.6 %   Platelets 244 150 - 400 K/uL   nRBC 0.0 0.0 - 0.2 %    Comment: Performed at [facility], [address], [place], [place] [address]  Basic metabolic panel     Status: Abnormal   Collection Time: 06/22/20  1:07 AM  Result Value Ref Range   Sodium 137 135 - 145 mmol/L   Potassium 3.6 3.5 - 5.1 mmol/L   Chloride 107 98 - 111 mmol/L   CO2 22 22 - 32 mmol/L   Glucose, Bld 80 70 - 99 mg/dL    Comment: Glucose reference range applies only to samples taken after fasting for at least 8 hours.   BUN 5 (L) 6 - 20 mg/dL   Creatinine, Ser 1.01 0.44 - 1.00 mg/dL   Calcium 8.3 (L) 8.9 - 10.3 mg/dL   GFR, Estimated >75 >10 mL/min    Comment: (NOTE) Calculated using the CKD-EPI Creatinine Equation (2021)    Anion gap 8 5 - 15  Comment: Performed at [facility], [address], [place], [place] [address]    Studies/Results: CT ABDOMEN PELVIS W CONTRAST  Result Date: 06/20/2020 CLINICAL DATA:  54 year old female with abdominal pain. Concern for acute diverticulitis. EXAM: CT ABDOMEN AND PELVIS WITH CONTRAST TECHNIQUE: Multidetector CT imaging of the abdomen and pelvis was performed using the standard protocol following bolus administration of intravenous contrast. CONTRAST:  OMNIPAQUE IOHEXOL 300 MG/ML  SOLN COMPARISON:  CT abdomen pelvis dated 05/29/2020. FINDINGS: Lower chest: The visualized lung bases are clear. No intra-abdominal free air. Trace free fluid in the pelvis. Hepatobiliary: Probable mild fatty liver. No intrahepatic biliary ductal dilatation. The gallbladder is unremarkable. Pancreas: Unremarkable. No pancreatic ductal dilatation or surrounding inflammatory changes. Spleen: Several subcentimeter splenic hypodense lesions are too small to characterize. The spleen is otherwise unremarkable. Adrenals/Urinary Tract: The  adrenal glands unremarkable. The kidneys, visualized ureters, and urinary bladder appear unremarkable. Stomach/Bowel: There is sigmoid diverticulosis. There is active inflammatory changes involving the distal descending/sigmoid junction consistent with acute diverticulitis. Small pockets of air adjacent to the sigmoid colon consistent with focally contained micro perforation. A 3.0 x 1.2 cm fluid adjacent to the sigmoid colon may represent inflammatory fluid. Developing abscess is not excluded. No drainable fluid collection identified at this time. There is diffuse colonic diverticula. There is no bowel obstruction. Appendectomy. Vascular/Lymphatic: The abdominal aorta and IVC are unremarkable. No portal venous gas. There is no adenopathy. Reproductive: Hysterectomy. No adnexal masses. Other: None Musculoskeletal: No acute or significant osseous findings. IMPRESSION: Sigmoid diverticulitis with focally contained micro perforation. No drainable fluid collection. Electronically Signed   By: Elgie Collard M.D.   On: 06/20/2020 16:30    Medications: I have reviewed the patient's current medications.  Assessment: Sigmoid diverticulitis with focally contained microperforation without drainable fluid collection Improvement in leukocytosis  Plan: Diet has been advanced to clear liquid Possible repeat CAT scan in the next 24 to 48 hours as per surgical team On IV Zosyn 3.375 g every 8 hours Encourage mobilization, out of bed to chair Diet to be advanced as per surgical recommendations and clinical improvement in the next 24-48 hrs.Kerin Salen, MD 06/22/2020, 11:32 AM

## 2020-06-22 NOTE — Progress Notes (Signed)
Progress Note    Deborah Chaney  ZOX:[id number] DOB: [birth year]  DOA: 06/20/2020 PCP: Deatra JamesSun, Vyvyan, MD    Brief Narrative:     Medical records reviewed and are as summarized below:  Deborah Chaney is an 54 y.o. female with medical history significant ofhypertension and diverticulitis who presented with complaints left lower quadrant abdominal pain. Initially diagnosed with mid sigmoid diverticulitis .  She returns with severe abdominal pain, had CT scan revealing significant for sigmoid diverticulitis with microperforation.  General surgery consult  Assessment/Plan:   Principal Problem:   Sepsis (HCC) Active Problems:   Diverticulitis of colon with perforation   Obesity, Class III, BMI 40-49.9 (morbid obesity) (HCC)   Sigmoiddiverticulitis with microperforation - last colonoscopy ~2016 Was seen in GI clinic for sigmoid. Diverti. and sent to ER due to concerns for her sx/s.  -CT also with a 3.0 x 1.2 cm fluid adjacent to the sigmoid colon may represent inflammatory fluid. Developing abscess is not excluded. --Surgery following.  Would like to try medical management. NPO , bowel rest. IV abx. Hopefully this will resolve. -may need repeat CT Scan if continues to have issues over next 48 hours  Sepsis -2/2 sigmoid diverticulitis with microperforation: Patient presented with leukocytosis, tachycardia  Lactic acid normal Follow-up blood cultures Continue IV antibiotics  obesity Body mass index is 41.63 kg/m.  HTN -BP stable  Family Communication/Anticipated D/C date and plan/Code Status   DVT prophylaxis: Lovenox ordered. Code Status: Full Code.  Disposition Plan: Status is: Inpatient  Remains inpatient appropriate because:IV treatments appropriate due to intensity of illness or inability to take PO   Dispo: The patient is from: Home              Anticipated d/c is to: Home              Anticipated d/c date is: 3 days              Patient  currently is not medically stable to d/c.         Medical Consultants:    General surgery  GI  Subjective:   Nausea after her clear tray  Objective:    Vitals:   06/21/20 1219 06/21/20 1706 06/22/20 0034 06/22/20 0543  BP: 112/71 127/75 112/66 126/72  Pulse: 93 90 (!) 102 (!) 101  Resp: 16 16 18 18   Temp: 98.4 F (36.9 C) 97.9 F (36.6 C) 99 F (37.2 C) 99.6 F (37.6 C)  TempSrc: Oral Oral Oral Oral  SpO2: 94% 95% 98% 98%  Weight:      Height:        Intake/Output Summary (Last 24 hours) at 06/22/2020 1151 Last data filed at 06/22/2020 1055 Gross per 24 hour  Intake 2053 ml  Output --  Net 2053 ml   Filed Weights   06/20/20 1700  Weight: 110 kg    Exam:  General: Appearance:    Severely obese female in no acute distress   +NBS, soft and tender in LLQ  Lungs:      respirations unlabored  Heart:    Tachycardic. Normal rhythm. No murmurs, rubs, or gallops.   MS:   All extremities are intact.   Neurologic:   Awake, alert, oriented x 3. No apparent focal neurological           defect.     Data Reviewed:   I have personally reviewed following labs and imaging studies:  Labs: Labs show the following:  Basic Metabolic Panel: Recent Labs  Lab 06/20/20 1257 06/21/20 0319 06/22/20 0107  NA 136 136 137  K 3.6 3.6 3.6  CL 103 105 107  CO2 23 22 22   GLUCOSE 100* 106* 80  BUN 8 5* 5*  CREATININE 0.90 0.96 0.88  CALCIUM 9.4 8.3* 8.3*   GFR Estimated Creatinine Clearance: 87.6 mL/min (by C-G formula based on SCr of 0.88 mg/dL). Liver Function Tests: Recent Labs  Lab 06/20/20 1257  AST 16  ALT 17  ALKPHOS 91  BILITOT 1.3*  PROT 7.4  ALBUMIN 3.6   Recent Labs  Lab 06/20/20 1257  LIPASE 16   No results for input(s): AMMONIA in the last 168 hours. Coagulation profile No results for input(s): INR, PROTIME in the last 168 hours.  CBC: Recent Labs  Lab 06/20/20 1257 06/21/20 0319 06/22/20 0107  WBC 14.4* 13.6* 11.0*  HGB 13.8  11.8* 11.8*  HCT 41.1 35.0* 36.5  MCV 88.2 88.6 89.5  PLT 306 251 244   Cardiac Enzymes: No results for input(s): CKTOTAL, CKMB, CKMBINDEX, TROPONINI in the last 168 hours. BNP (last 3 results) No results for input(s): PROBNP in the last 8760 hours. CBG: No results for input(s): GLUCAP in the last 168 hours. D-Dimer: No results for input(s): DDIMER in the last 72 hours. Hgb A1c: No results for input(s): HGBA1C in the last 72 hours. Lipid Profile: No results for input(s): CHOL, HDL, LDLCALC, TRIG, CHOLHDL, LDLDIRECT in the last 72 hours. Thyroid function studies: No results for input(s): TSH, T4TOTAL, T3FREE, THYROIDAB in the last 72 hours.  Invalid input(s): FREET3 Anemia work up: No results for input(s): VITAMINB12, FOLATE, FERRITIN, TIBC, IRON, RETICCTPCT in the last 72 hours. Sepsis Labs: Recent Labs  Lab 06/20/20 1257 06/20/20 1645 06/21/20 0319 06/22/20 0107  WBC 14.4*  --  13.6* 11.0*  LATICACIDVEN  --  0.9 0.7  --     Microbiology Recent Results (from the past 240 hour(s))  SARS CORONAVIRUS 2 (TAT 6-24 HRS) Nasopharyngeal Nasopharyngeal Swab     Status: None   Collection Time: 06/20/20  3:47 PM   Specimen: Nasopharyngeal Swab  Result Value Ref Range Status   SARS Coronavirus 2 NEGATIVE NEGATIVE Final    Comment: (NOTE) SARS-CoV-2 target nucleic acids are NOT DETECTED.  The SARS-CoV-2 RNA is generally detectable in upper and lower respiratory specimens during the acute phase of infection. Negative results do not preclude SARS-CoV-2 infection, do not rule out co-infections with other pathogens, and should not be used as the sole basis for treatment or other patient management decisions. Negative results must be combined with clinical observations, patient history, and epidemiological information. The expected result is Negative.  Fact Sheet for Patients: 06/22/20  Fact Sheet for Healthcare  Providers: HairSlick.no  This test is not yet approved or cleared by the [place]ir.[place] FDA and  has been authorized for detection and/or diagnosis of SARS-CoV-2 by FDA under an Emergency Use Authorization (EUA). This EUA will remain  in effect (meaning this test can be used) for the duration of the COVID-19 declaration under Se ction 564(b)(1) of the Act, 21 U.S.C. section 360bbb-3(b)(1), unless the authorization is terminated or revoked sooner.  Performed at [facility], [address] [place], [place]   Blood culture (routine x 2)     Status: None (Preliminary result)   Collection Time: 06/20/20  5:18 PM   Specimen: BLOOD  Result Value Ref Range Status   Specimen Description BLOOD LEFT ANTECUBITAL  Final  Special Requests   Final    BOTTLES DRAWN AEROBIC AND ANAEROBIC Blood Culture adequate volume   Culture   Final    NO GROWTH 2 DAYS Performed at [facility], [address] [place], [place] [address]    Report Status PENDING  Incomplete  Blood culture (routine x 2)     Status: None (Preliminary result)   Collection Time: 06/20/20  6:21 PM   Specimen: BLOOD  Result Value Ref Range Status   Specimen Description BLOOD SITE NOT SPECIFIED  Final   Special Requests   Final    BOTTLES DRAWN AEROBIC AND ANAEROBIC Blood Culture adequate volume   Culture   Final    NO GROWTH 2 DAYS Performed at [facility], [address] [place], [place] [address]    Report Status PENDING  Incomplete    Procedures and diagnostic studies:  CT ABDOMEN PELVIS W CONTRAST  Result Date: 06/20/2020 CLINICAL DATA:  54 year old female with abdominal pain. Concern for acute diverticulitis. EXAM: CT ABDOMEN AND PELVIS WITH CONTRAST TECHNIQUE: Multidetector CT imaging of the abdomen and pelvis was performed using the standard protocol following bolus administration of intravenous contrast. CONTRAST:  OMNIPAQUE IOHEXOL 300 MG/ML  SOLN  COMPARISON:  CT abdomen pelvis dated 05/29/2020. FINDINGS: Lower chest: The visualized lung bases are clear. No intra-abdominal free air. Trace free fluid in the pelvis. Hepatobiliary: Probable mild fatty liver. No intrahepatic biliary ductal dilatation. The gallbladder is unremarkable. Pancreas: Unremarkable. No pancreatic ductal dilatation or surrounding inflammatory changes. Spleen: Several subcentimeter splenic hypodense lesions are too small to characterize. The spleen is otherwise unremarkable. Adrenals/Urinary Tract: The adrenal glands unremarkable. The kidneys, visualized ureters, and urinary bladder appear unremarkable. Stomach/Bowel: There is sigmoid diverticulosis. There is active inflammatory changes involving the distal descending/sigmoid junction consistent with acute diverticulitis. Small pockets of air adjacent to the sigmoid colon consistent with focally contained micro perforation. A 3.0 x 1.2 cm fluid adjacent to the sigmoid colon may represent inflammatory fluid. Developing abscess is not excluded. No drainable fluid collection identified at this time. There is diffuse colonic diverticula. There is no bowel obstruction. Appendectomy. Vascular/Lymphatic: The abdominal aorta and IVC are unremarkable. No portal venous gas. There is no adenopathy. Reproductive: Hysterectomy. No adnexal masses. Other: None Musculoskeletal: No acute or significant osseous findings. IMPRESSION: Sigmoid diverticulitis with focally contained micro perforation. No drainable fluid collection. Electronically Signed   By: Elgie Collard M.D.   On: 06/20/2020 16:30    Medications:   . enoxaparin (LOVENOX) injection  40 mg Subcutaneous Q24H  . saccharomyces boulardii  250 mg Oral BID  . sodium chloride flush  3 mL Intravenous Q12H   Continuous Infusions: . sodium chloride 125 mL/hr at 06/22/20 1135  . methocarbamol (ROBAXIN) IV    . piperacillin-tazobactam (ZOSYN)  IV 3.375 g (06/22/20 0548)     LOS: 2 days    Joseph Art  [facility]   How to contact the [facility] Attending or Consulting provider 7A - 7P or covering provider during after hours 7P -7A, for this patient?  1. Check the care team in [facility] and look for a) attending/consulting TRH provider listed and b) the [facility] team listed 2. Log into [email or web address] and use [place]'s universal password to access. If you do not have the password, please contact the hospital operator. 3. Locate the [facility] provider you are looking for under [facility] and page to a number that you can be directly reached. 4. If you still  have difficulty reaching the provider, please page the [facility] (Director on Call) for the Hospitalists listed on amion for assistance.  06/22/2020, 11:51 AM

## 2020-06-23 LAB — CBC
HCT: 33.8 % — ABNORMAL LOW (ref 36.0–46.0)
Hemoglobin: 11.5 g/dL — ABNORMAL LOW (ref 12.0–15.0)
MCH: 29.9 pg (ref 26.0–34.0)
MCHC: 34 g/dL (ref 30.0–36.0)
MCV: 87.8 fL (ref 80.0–100.0)
Platelets: 259 10*3/uL (ref 150–400)
RBC: 3.85 MIL/uL — ABNORMAL LOW (ref 3.87–5.11)
RDW: 12.8 % (ref 11.5–15.5)
WBC: 7.7 10*3/uL (ref 4.0–10.5)
nRBC: 0 % (ref 0.0–0.2)

## 2020-06-23 NOTE — Progress Notes (Signed)
I spoke with the patient's husband by telephone today.  Basically, I think the patient has shown some degree of improvement in her acute inflammation, as evidenced by less tenderness on exam, and improvement in her white count.  However, I still think she has smoldering disease and it remains to be seen whether medical therapy will bring about sufficient resolution in her symptoms to allow discharge without need for surgery.  Moreover, even if she does get released, I think there is a moderate chance she will need surgery before too long, based on her recent history.  I agree with the surgeons plan to obtain a follow-up CT in a day or 2 to see what the progression of her condition is.  At this point, I do think that her care is being appropriately guided by the surgeons, and there probably is not a role for [place], so we will sign off, but will be on standby, so do not hesitate to call us if further input from [place] would be helpful.  Florencia Reasons, M.D. Pager [phone] If no answer or after 5 PM call [phone]

## 2020-06-23 NOTE — Progress Notes (Signed)
[facility] Progress Note     Subjective: CC-  Feels about the same as yesterday. Continues to have some LLQ pain. Somewhat worse last night, improved this AM. Rates pain as 3-4/10. Denies n/v. Passing flatus, last BM 2 days ago. Tolerating few sips of clears. WBC down 7.7, afebrile.  Objective: Vital signs in last 24 hours: Temp:  [98.3 F (36.8 C)-98.6 F (37 C)] 98.6 F (37 C) (12/30 6213) Pulse Rate:  [89-102] 101 (12/30 0613) Resp:  [16-18] 17 (12/30 0613) BP: (116-125)/(68-83) 125/83 (12/30 0613) SpO2:  [94 %-99 %] 94 % (12/30 0613) Last BM Date: 06/20/20  Intake/Output from previous day: 12/29 0701 - 12/30 0700 In: 53 [I.V.:3; IV Piggyback:50] Out: -  Intake/Output this shift: No intake/output data recorded.  PE: Gen: Alert, NAD, pleasant HEENT: EOM's intact, pupils equal and round Card: RRR Pulm: rate and effort normalon room air Abd: Soft,obese, ND, +BS, no HSM, focal moderate TTP LLQ with voluntary guarding Psych: A&Ox4  Skin: no rashes noted, warm and dry    Lab Results:  Recent Labs    06/22/20 0107 06/23/20 0029  WBC 11.0* 7.7  HGB 11.8* 11.5*  HCT 36.5 33.8*  PLT 244 259   BMET Recent Labs    06/21/20 0319 06/22/20 0107  NA 136 137  K 3.6 3.6  CL 105 107  CO2 22 22  GLUCOSE 106* 80  BUN 5* 5*  CREATININE 0.96 0.88  CALCIUM 8.3* 8.3*   PT/INR No results for input(s): LABPROT, INR in the last 72 hours. CMP     Component Value Date/Time   NA 137 06/22/2020 0107   K 3.6 06/22/2020 0107   CL 107 06/22/2020 0107   CO2 22 06/22/2020 0107   GLUCOSE 80 06/22/2020 0107   BUN 5 (L) 06/22/2020 0107   CREATININE 0.88 06/22/2020 0107   CALCIUM 8.3 (L) 06/22/2020 0107   PROT 7.4 06/20/2020 1257   ALBUMIN 3.6 06/20/2020 1257   AST 16 06/20/2020 1257   ALT 17 06/20/2020 1257   ALKPHOS 91 06/20/2020 1257   BILITOT 1.3 (H) 06/20/2020 1257   GFRNONAA >60 06/22/2020 0107   GFRAA >60 02/10/2019 0933   Lipase     Component  Value Date/Time   LIPASE 16 06/20/2020 1257       Studies/Results: No results found.  Anti-infectives: Anti-infectives (From admission, onward)   Start     Dose/Rate Route Frequency Ordered Stop   06/20/20 2200  piperacillin-tazobactam (ZOSYN) IVPB 3.375 g  Status:  Discontinued        3.375 g 100 mL/hr over 30 Minutes Intravenous Every 8 hours 06/20/20 1715 06/20/20 1718   06/20/20 2130  piperacillin-tazobactam (ZOSYN) IVPB 3.375 g        3.375 g 12.5 mL/hr over 240 Minutes Intravenous Every 8 hours 06/20/20 1719     06/20/20 1530  piperacillin-tazobactam (ZOSYN) IVPB 3.375 g        3.375 g 100 mL/hr over 30 Minutes Intravenous  Once 06/20/20 1517 06/20/20 1641       Assessment/Plan Obesity BMI 41.63 HTN  Sigmoiddiverticulitis with microperforation - last colonoscopy ~2016 -CT 12/27 shows sigmoid diverticulitis with focally contained micro perforation, no drainable fluid collection; a3.0 x 1.2 cm fluid adjacent to the sigmoid colon may represent inflammatory fluid or developing abscess - Hopefully this will resolve with medical management.Discussed possibility of developing a drainable abscess, versus resolution with antibiotics alone, versus failure of medical therapy and possibly needing a segmental colon resection this admission  with high risk of colostomy. If she improves with acute surgery plan for outpatient follow up with colorectal surgeon to discuss elective colectomy  ID -zosyn 12/27>> FEN -IVF, CLD VTE -SCDs, lovenox Foley -none Follow up -TBD  Plan: WBC has normalized but she is still very tender on abdominal exam in the LLQ. Continue CLD and IV antibiotics. Plan for repeat CT scan in 1-2 days. Last scan was on 12/27.   LOS: 3 days    Franne Forts, [facility] 06/23/2020, 8:22 AM Please see Amion for pager number during day hours 7:00am-4:30pm

## 2020-06-23 NOTE — Progress Notes (Signed)
Progress Note    LAVIDA PATCH  [id number] DOB: April 03, 1966  DOA: 06/20/2020 PCP: Deatra James, MD    Brief Narrative:     Medical records reviewed and are as summarized below:  Deborah Chaney is an 54 y.o. female with medical history significant ofhypertension and diverticulitis who presented with complaints left lower quadrant abdominal pain. Initially diagnosed with mid sigmoid diverticulitis .  She returns with severe abdominal pain, had CT scan revealing significant for sigmoid diverticulitis with microperforation.  General surgery consulted  Assessment/Plan:   Principal Problem:   Sepsis (HCC) Active Problems:   Diverticulitis of colon with perforation   Obesity, Class III, BMI 40-49.9 (morbid obesity) (HCC)   Sigmoiddiverticulitis with microperforation - last colonoscopy ~2016 Was seen in GI clinic for sigmoid. Diverti. and sent to ER due to concerns for her sx/s.  -CT also with a 3.0 x 1.2 cm fluid adjacent to the sigmoid colon may represent inflammatory fluid. Developing abscess is not excluded. --Surgery following.  Would like to try medical management. NPO , bowel rest. IV abx. Hopefully this will resolve. -may need repeat CT Scan in 24-48 hours  Sepsis -2/2 sigmoid diverticulitis with microperforation: Patient presented with leukocytosis, tachycardia  Lactic acid normal  blood cultures NGTD Continue IV antibiotics  obesity Body mass index is 41.63 kg/m.  HTN -BP stable  Family Communication/Anticipated D/C date and plan/Code Status   DVT prophylaxis: Lovenox ordered. Code Status: Full Code.  Disposition Plan: Status is: Inpatient  Remains inpatient appropriate because:IV treatments appropriate due to intensity of illness or inability to take PO   Dispo: The patient is from: Home              Anticipated d/c is to: Home              Anticipated d/c date is: 3 days              Patient currently is not medically stable to  d/c.         Medical Consultants:    General surgery  GI  Subjective:   Nausea in the AM  Objective:    Vitals:   06/22/20 1834 06/22/20 2308 06/23/20 0613 06/23/20 1206  BP: 116/76 125/80 125/83 118/81  Pulse: 89 93 (!) 101 88  Resp: 16 18 17 18   Temp: 98.3 F (36.8 C) 98.4 F (36.9 C) 98.6 F (37 C) 98 F (36.7 C)  TempSrc: Oral Oral Oral Oral  SpO2: 99% 95% 94% 94%  Weight:      Height:        Intake/Output Summary (Last 24 hours) at 06/23/2020 1336 Last data filed at 06/23/2020 0200 Gross per 24 hour  Intake 50 ml  Output --  Net 50 ml   Filed Weights   06/20/20 1700  Weight: 110 kg    Exam:  In bed, NAD No increased work of breathing No LE Edema A+OX3                    Data Reviewed:   I have personally reviewed following labs and imaging studies:  Labs: Labs show the following:   Basic Metabolic Panel: Recent Labs  Lab 06/20/20 1257 06/21/20 0319 06/22/20 0107  NA 136 136 137  K 3.6 3.6 3.6  CL 103 105 107  CO2 23 22 22   GLUCOSE 100* 106* 80  BUN 8 5* 5*  CREATININE 0.90 0.96 0.88  CALCIUM 9.4 8.3* 8.3*   GFR  Estimated Creatinine Clearance: 87.6 mL/min (by C-G formula based on SCr of 0.88 mg/dL). Liver Function Tests: Recent Labs  Lab 06/20/20 1257  AST 16  ALT 17  ALKPHOS 91  BILITOT 1.3*  PROT 7.4  ALBUMIN 3.6   Recent Labs  Lab 06/20/20 1257  LIPASE 16   No results for input(s): AMMONIA in the last 168 hours. Coagulation profile No results for input(s): INR, PROTIME in the last 168 hours.  CBC: Recent Labs  Lab 06/20/20 1257 06/21/20 0319 06/22/20 0107 06/23/20 0029  WBC 14.4* 13.6* 11.0* 7.7  HGB 13.8 11.8* 11.8* 11.5*  HCT 41.1 35.0* 36.5 33.8*  MCV 88.2 88.6 89.5 87.8  PLT 306 251 244 259   Cardiac Enzymes: No results for input(s): CKTOTAL, CKMB, CKMBINDEX, TROPONINI in the last 168 hours. BNP (last 3 results) No results for input(s): PROBNP in the last 8760 hours. CBG: No results  for input(s): GLUCAP in the last 168 hours. D-Dimer: No results for input(s): DDIMER in the last 72 hours. Hgb A1c: No results for input(s): HGBA1C in the last 72 hours. Lipid Profile: No results for input(s): CHOL, HDL, LDLCALC, TRIG, CHOLHDL, LDLDIRECT in the last 72 hours. Thyroid function studies: No results for input(s): TSH, T4TOTAL, T3FREE, THYROIDAB in the last 72 hours.  Invalid input(s): FREET3 Anemia work up: No results for input(s): VITAMINB12, FOLATE, FERRITIN, TIBC, IRON, RETICCTPCT in the last 72 hours. Sepsis Labs: Recent Labs  Lab 06/20/20 1257 06/20/20 1645 06/21/20 0319 06/22/20 0107 06/23/20 0029  WBC 14.4*  --  13.6* 11.0* 7.7  LATICACIDVEN  --  0.9 0.7  --   --     Microbiology Recent Results (from the past 240 hour(s))  SARS CORONAVIRUS 2 (TAT 6-24 HRS) Nasopharyngeal Nasopharyngeal Swab     Status: None   Collection Time: 06/20/20  3:47 PM   Specimen: Nasopharyngeal Swab  Result Value Ref Range Status   SARS Coronavirus 2 NEGATIVE NEGATIVE Final    Comment: (NOTE) SARS-CoV-2 target nucleic acids are NOT DETECTED.  The SARS-CoV-2 RNA is generally detectable in upper and lower respiratory specimens during the acute phase of infection. Negative results do not preclude SARS-CoV-2 infection, do not rule out co-infections with other pathogens, and should not be used as the sole basis for treatment or other patient management decisions. Negative results must be combined with clinical observations, patient history, and epidemiological information. The expected result is Negative.  Fact Sheet for Patients: HairSlick.no  Fact Sheet for Healthcare Providers: quierodirigir.com  This test is not yet approved or cleared by the [place] FDA and  has been authorized for detection and/or diagnosis of SARS-CoV-2 by FDA under an Emergency Use Authorization (EUA). This EUA will remain  in effect  (meaning this test can be used) for the duration of the COVID-19 declaration under Se ction 564(b)(1) of the Act, 21 U.S.C. section 360bbb-3(b)(1), unless the authorization is terminated or revoked sooner.  Performed at [facility], [address] [place], [place] [address]   Blood culture (routine x 2)     Status: None (Preliminary result)   Collection Time: 06/20/20  5:18 PM   Specimen: BLOOD  Result Value Ref Range Status   Specimen Description BLOOD LEFT ANTECUBITAL  Final   Special Requests   Final    BOTTLES DRAWN AEROBIC AND ANAEROBIC Blood Culture adequate volume   Culture   Final    NO GROWTH 3 DAYS Performed at [facility], [address] [place], [place]  [address]    Report Status PENDING  Incomplete  Blood culture (routine x 2)     Status: None (Preliminary result)   Collection Time: 06/20/20  6:21 PM   Specimen: BLOOD  Result Value Ref Range Status   Specimen Description BLOOD SITE NOT SPECIFIED  Final   Special Requests   Final    BOTTLES DRAWN AEROBIC AND ANAEROBIC Blood Culture adequate volume   Culture   Final    NO GROWTH 3 DAYS Performed at [facility], [address], [place], [place] [address]    Report Status PENDING  Incomplete    Procedures and diagnostic studies:  No results found.  Medications:   . enoxaparin (LOVENOX) injection  40 mg Subcutaneous Q24H  . saccharomyces boulardii  250 mg Oral BID  . sodium chloride flush  3 mL Intravenous Q12H   Continuous Infusions: . sodium chloride 125 mL/hr at 06/22/20 1953  . methocarbamol (ROBAXIN) IV    . piperacillin-tazobactam (ZOSYN)  IV 3.375 g (06/23/20 1478)     LOS: 3 days   Joseph Art  [facility]   How to contact the [facility] Attending or Consulting provider 7A - 7P or covering provider during after hours 7P -7A, for this patient?  1. Check the care team in [facility] and look for a) attending/consulting TRH provider listed and b) the [facility] team listed 2. Log into  [email or web address] and use [place]'s universal password to access. If you do not have the password, please contact the hospital operator. 3. Locate the [facility] provider you are looking for under [facility] and page to a number that you can be directly reached. 4. If you still have difficulty reaching the provider, please page the [facility] (Director on Call) for the Hospitalists listed on amion for assistance.  06/23/2020, 1:36 PM

## 2020-06-24 LAB — CBC
HCT: 36.1 % (ref 36.0–46.0)
Hemoglobin: 12.4 g/dL (ref 12.0–15.0)
MCH: 29.7 pg (ref 26.0–34.0)
MCHC: 34.3 g/dL (ref 30.0–36.0)
MCV: 86.6 fL (ref 80.0–100.0)
Platelets: 295 10*3/uL (ref 150–400)
RBC: 4.17 MIL/uL (ref 3.87–5.11)
RDW: 12.7 % (ref 11.5–15.5)
WBC: 6.4 10*3/uL (ref 4.0–10.5)
nRBC: 0 % (ref 0.0–0.2)

## 2020-06-24 LAB — URINALYSIS, ROUTINE W REFLEX MICROSCOPIC
Bilirubin Urine: NEGATIVE
Glucose, UA: NEGATIVE mg/dL
Hgb urine dipstick: NEGATIVE
Ketones, ur: NEGATIVE mg/dL
Leukocytes,Ua: NEGATIVE
Nitrite: NEGATIVE
Protein, ur: NEGATIVE mg/dL
Specific Gravity, Urine: 1.003 — ABNORMAL LOW (ref 1.005–1.030)
pH: 7 (ref 5.0–8.0)

## 2020-06-24 LAB — BASIC METABOLIC PANEL
Anion gap: 11 (ref 5–15)
BUN: <5 mg/dL — ABNORMAL LOW (ref 6–20)
CO2: 23 mmol/L (ref 22–32)
Calcium: 8.8 mg/dL — ABNORMAL LOW (ref 8.9–10.3)
Chloride: 108 mmol/L (ref 98–111)
Creatinine, Ser: 0.83 mg/dL (ref 0.44–1.00)
GFR, Estimated: >60 mL/min (ref >60)
Glucose, Bld: 100 mg/dL — ABNORMAL HIGH (ref 70–99)
Potassium: 3.2 mmol/L — ABNORMAL LOW (ref 3.5–5.1)
Sodium: 142 mmol/L (ref 135–145)

## 2020-06-24 LAB — MAGNESIUM: Magnesium: 2 mg/dL (ref 1.7–2.4)

## 2020-06-24 MED ORDER — POTASSIUM CHLORIDE 20 MEQ PO PACK
40.0000 meq | PACK | Freq: Once | ORAL | Status: AC
  Administered 2020-06-24: 40 meq via ORAL
  Filled 2020-06-24: qty 2

## 2020-06-24 MED ORDER — ENOXAPARIN SODIUM 60 MG/0.6ML ~~LOC~~ SOLN
55.0000 mg | SUBCUTANEOUS | Status: DC
  Administered 2020-06-24 – 2020-06-25 (×2): 55 mg via SUBCUTANEOUS
  Filled 2020-06-24 (×2): qty 0.6

## 2020-06-24 MED ORDER — POTASSIUM CHLORIDE CRYS ER 20 MEQ PO TBCR
40.0000 meq | EXTENDED_RELEASE_TABLET | Freq: Once | ORAL | Status: AC
  Administered 2020-06-24: 40 meq via ORAL
  Filled 2020-06-24: qty 2

## 2020-06-24 NOTE — Progress Notes (Signed)
[facility] Progress Note     Subjective: CC-  States she feels better than yesterday.  Is been tolerating her liquid diet without issues.  She still has some pain in the left lower quadrant.  She notes that when she urinates she noticed some gas in her urine.  Objective: Vital signs in last 24 hours: Temp:  [98 F (36.7 C)-98.5 F (36.9 C)] 98.2 F (36.8 C) (12/31 1126) Pulse Rate:  [77-95] 89 (12/31 1126) Resp:  [16-18] 18 (12/31 1126) BP: (114-131)/(52-88) 125/88 (12/31 1126) SpO2:  [94 %-98 %] 98 % (12/31 1126) Last BM Date: 06/23/20  Intake/Output from previous day: No intake/output data recorded. Intake/Output this shift: Total I/O In: 3 [I.V.:3] Out: -   PE: Gen: Alert, NAD, pleasant HEENT: EOM's intact, pupils equal and round Card: RRR Pulm: rate and effort normalon room air Abd: Soft,obese, ND, +BS, no HSM, focal moderate TTP LLQ with voluntary guarding Psych: A&Ox4  Skin: no rashes noted, warm and dry    Lab Results:  Recent Labs    06/23/20 0029 06/24/20 0609  WBC 7.7 6.4  HGB 11.5* 12.4  HCT 33.8* 36.1  PLT 259 295   BMET Recent Labs    06/22/20 0107 06/24/20 0609  NA 137 142  K 3.6 3.2*  CL 107 108  CO2 22 23  GLUCOSE 80 100*  BUN 5* <5*  CREATININE 0.88 0.83  CALCIUM 8.3* 8.8*   PT/INR No results for input(s): LABPROT, INR in the last 72 hours. CMP     Component Value Date/Time   NA 142 06/24/2020 0609   K 3.2 (L) 06/24/2020 0609   CL 108 06/24/2020 0609   CO2 23 06/24/2020 0609   GLUCOSE 100 (H) 06/24/2020 0609   BUN <5 (L) 06/24/2020 0609   CREATININE 0.83 06/24/2020 0609   CALCIUM 8.8 (L) 06/24/2020 0609   PROT 7.4 06/20/2020 1257   ALBUMIN 3.6 06/20/2020 1257   AST 16 06/20/2020 1257   ALT 17 06/20/2020 1257   ALKPHOS 91 06/20/2020 1257   BILITOT 1.3 (H) 06/20/2020 1257   GFRNONAA >60 06/24/2020 0609   GFRAA >60 02/10/2019 0933   Lipase     Component Value Date/Time   LIPASE 16 06/20/2020 1257        Studies/Results: No results found.  Anti-infectives: Anti-infectives (From admission, onward)   Start     Dose/Rate Route Frequency Ordered Stop   06/20/20 2200  piperacillin-tazobactam (ZOSYN) IVPB 3.375 g  Status:  Discontinued        3.375 g 100 mL/hr over 30 Minutes Intravenous Every 8 hours 06/20/20 1715 06/20/20 1718   06/20/20 2130  piperacillin-tazobactam (ZOSYN) IVPB 3.375 g        3.375 g 12.5 mL/hr over 240 Minutes Intravenous Every 8 hours 06/20/20 1719     06/20/20 1530  piperacillin-tazobactam (ZOSYN) IVPB 3.375 g        3.375 g 100 mL/hr over 30 Minutes Intravenous  Once 06/20/20 1517 06/20/20 1641       Assessment/Plan Obesity BMI 41.63 HTN  Smoldering sigmoiddiverticulitis with phlegmon - last colonoscopy ~2016 -CT 12/27 shows sigmoid diverticulitis with phlegmon -She has failed 2 courses of antibiotics since November to treat this diverticulitis - She may be developing a colovesicular fistula - We will repeat a CT scan in the morning to evaluate for any drainable fluid collection -If there is a fluid collection we will consult IR for drainage -If there is no fluid collection we will place her  on a course of antibiotics to get her through to an elective sigmoid colectomy in a few weeks.  ID -zosyn 12/27>>  Plan to continue antibiotics until surgery FEN -IVF, CLD VTE -SCDs, lovenox Foley -none Follow up -TBD     LOS: 4 days    Quentin Ore, [facility] Surgery 06/24/2020, 12:05 PM Please see Amion for pager number during day hours 7:00am-4:30pm

## 2020-06-24 NOTE — Progress Notes (Addendum)
Pharmacy Antibiotic Note  Deborah Chaney is a 54 y.o. female admitted on 06/20/2020 with sigmoid diverticulitis.  Pharmacy has been consulted for zosyn dosing.  Pt was admitted for abx pain with diverticulitis. Pt is now on D4 of abx. Plan is to repeat CT to eval for further management. Cultures remained neg.   Scr<1  BMI 42>>ok to increase lovenox to 0.5mg /kg/day per Dr. Benjamine Mola. Also ok to give KCL 40 meq x1 for k+ 3.2 today.   Plan: Cont Zosyn 3.375g IV q8 KCL 40 meq PO  x1  Height: 5\' 4"  (162.6 cm) Weight: 110 kg (242 lb 8.1 oz) IBW/kg (Calculated) : 54.7  Temp (24hrs), Avg:98.3 F (36.8 C), Min:98 F (36.7 C), Max:98.5 F (36.9 C)  Recent Labs  Lab 06/20/20 1257 06/20/20 1645 06/21/20 0319 06/22/20 0107 06/23/20 0029 06/24/20 0609  WBC 14.4*  --  13.6* 11.0* 7.7 6.4  CREATININE 0.90  --  0.96 0.88  --  0.83  LATICACIDVEN  --  0.9 0.7  --   --   --     Estimated Creatinine Clearance: 92.9 mL/min (by C-G formula based on SCr of 0.83 mg/dL).    Allergies  Allergen Reactions  . Penicillins Other (See Comments)    Head feels tight Face tightens up  . Shrimp (Diagnostic) Itching    Throat itching    Antimicrobials this admission: 12/27 zosyn>>  Dose adjustments this admission:   Microbiology results: 12/27 blood>>ngtd  1/28, PharmD, BCIDP, AAHIVP, CPP Infectious Disease Pharmacist 06/24/2020 8:32 AM

## 2020-06-24 NOTE — Progress Notes (Signed)
Progress Note    Deborah JUSTE  Chaney:[id number] DOB: 1965-08-28  DOA: 06/20/2020 PCP: Deatra James, MD    Brief Narrative:     Medical records reviewed and are as summarized below:  Deborah Chaney is an 54 y.o. female with medical history significant ofhypertension and diverticulitis who presented with complaints left lower quadrant abdominal pain. Initially diagnosed with mid sigmoid diverticulitis .  She returns with severe abdominal pain, had CT scan revealing significant for sigmoid diverticulitis with microperforation.  General surgery consulted.  Slowly improving.  Assessment/Plan:   Principal Problem:   Sepsis (HCC) Active Problems:   Diverticulitis of colon with perforation   Obesity, Class III, BMI 40-49.9 (morbid obesity) (HCC)   Sigmoiddiverticulitis with microperforation - last colonoscopy ~2016 Was seen in GI clinic for sigmoid. Diverti. and sent to ER due to concerns for her sx/s.  -CT on 12/27 with a 3.0 x 1.2 cm fluid adjacent to the sigmoid colon may represent inflammatory fluid. Developing abscess is not excluded. --Surgery following.  Would like to try medical management. Plan for repeat CT scan in AM  Sepsis -2/2 sigmoid diverticulitis with microperforation: Patient presented with leukocytosis, tachycardia  Lactic acid normal  blood cultures NGTD Continue IV antibiotics  obesity Body mass index is 41.63 kg/m.  HTN -BP stable  hypokalemia -replete  Family Communication/Anticipated D/C date and plan/Code Status   DVT prophylaxis: Lovenox ordered. Code Status: Full Code.  Disposition Plan: Status is: Inpatient  Remains inpatient appropriate because:IV treatments appropriate due to intensity of illness or inability to take PO   Dispo: The patient is from: Home              Anticipated d/c is to: Home              Anticipated d/c date is: 24-48 hours              Patient currently is not medically stable to d/c. pending  repeat CT scan and possible advancement of diet         Medical Consultants:    General surgery  GI  Subjective:   Would like to take a shower  Objective:    Vitals:   06/23/20 1652 06/24/20 0000 06/24/20 0505 06/24/20 1126  BP: 114/79 131/80 (!) 123/52 125/88  Pulse: 95 91 77 89  Resp: 18 18 16 18   Temp: 98.5 F (36.9 C) 98.4 F (36.9 C)  98.2 F (36.8 C)  TempSrc: Oral Oral Oral Oral  SpO2: 95% 98% 97% 98%  Weight:      Height:        Intake/Output Summary (Last 24 hours) at 06/24/2020 1354 Last data filed at 06/24/2020 1055 Gross per 24 hour  Intake 3 ml  Output --  Net 3 ml   Filed Weights   06/20/20 1700  Weight: 110 kg    Exam:  General: Appearance:    Severely obese female in no acute distress     Lungs:     respirations unlabored  Heart:    Normal heart rate. Normal rhythm. No murmurs, rubs, or gallops.   MS:   All extremities are intact.   Neurologic:   Awake, alert, oriented x 3. No apparent focal neurological           defect.                  Data Reviewed:   I have personally reviewed following labs and imaging studies:  Labs:  Labs show the following:   Basic Metabolic Panel: Recent Labs  Lab 06/20/20 1257 06/21/20 0319 06/22/20 0107 06/24/20 0609  NA 136 136 137 142  K 3.6 3.6 3.6 3.2*  CL 103 105 107 108  CO2 23 22 22 23   GLUCOSE 100* 106* 80 100*  BUN 8 5* 5* <5*  CREATININE 0.90 0.96 0.88 0.83  CALCIUM 9.4 8.3* 8.3* 8.8*  MG  --   --   --  2.0   GFR Estimated Creatinine Clearance: 92.9 mL/min (by C-G formula based on SCr of 0.83 mg/dL). Liver Function Tests: Recent Labs  Lab 06/20/20 1257  AST 16  ALT 17  ALKPHOS 91  BILITOT 1.3*  PROT 7.4  ALBUMIN 3.6   Recent Labs  Lab 06/20/20 1257  LIPASE 16   No results for input(s): AMMONIA in the last 168 hours. Coagulation profile No results for input(s): INR, PROTIME in the last 168 hours.  CBC: Recent Labs  Lab 06/20/20 1257 06/21/20 0319  06/22/20 0107 06/23/20 0029 06/24/20 0609  WBC 14.4* 13.6* 11.0* 7.7 6.4  HGB 13.8 11.8* 11.8* 11.5* 12.4  HCT 41.1 35.0* 36.5 33.8* 36.1  MCV 88.2 88.6 89.5 87.8 86.6  PLT 306 251 244 259 295   Cardiac Enzymes: No results for input(s): CKTOTAL, CKMB, CKMBINDEX, TROPONINI in the last 168 hours. BNP (last 3 results) No results for input(s): PROBNP in the last 8760 hours. CBG: No results for input(s): GLUCAP in the last 168 hours. D-Dimer: No results for input(s): DDIMER in the last 72 hours. Hgb A1c: No results for input(s): HGBA1C in the last 72 hours. Lipid Profile: No results for input(s): CHOL, HDL, LDLCALC, TRIG, CHOLHDL, LDLDIRECT in the last 72 hours. Thyroid function studies: No results for input(s): TSH, T4TOTAL, T3FREE, THYROIDAB in the last 72 hours.  Invalid input(s): FREET3 Anemia work up: No results for input(s): VITAMINB12, FOLATE, FERRITIN, TIBC, IRON, RETICCTPCT in the last 72 hours. Sepsis Labs: Recent Labs  Lab 06/20/20 1645 06/21/20 0319 06/22/20 0107 06/23/20 0029 06/24/20 0609  WBC  --  13.6* 11.0* 7.7 6.4  LATICACIDVEN 0.9 0.7  --   --   --     Microbiology Recent Results (from the past 240 hour(s))  SARS CORONAVIRUS 2 (TAT 6-24 HRS) Nasopharyngeal Nasopharyngeal Swab     Status: None   Collection Time: 06/20/20  3:47 PM   Specimen: Nasopharyngeal Swab  Result Value Ref Range Status   SARS Coronavirus 2 NEGATIVE NEGATIVE Final    Comment: (NOTE) SARS-CoV-2 target nucleic acids are NOT DETECTED.  The SARS-CoV-2 RNA is generally detectable in upper and lower respiratory specimens during the acute phase of infection. Negative results do not preclude SARS-CoV-2 infection, do not rule out co-infections with other pathogens, and should not be used as the sole basis for treatment or other patient management decisions. Negative results must be combined with clinical observations, patient history, and epidemiological information. The  expected result is Negative.  Fact Sheet for Patients: 06/22/20  Fact Sheet for Healthcare Providers: HairSlick.no  This test is not yet approved or cleared by the quierodirigir.com FDA and  has been authorized for detection and/or diagnosis of SARS-CoV-2 by FDA under an Emergency Use Authorization (EUA). This EUA will remain  in effect (meaning this test can be used) for the duration of the COVID-19 declaration under Se ction 564(b)(1) of the Act, 21 U.S.C. section 360bbb-3(b)(1), unless the authorization is terminated or revoked sooner.  Performed at [facility], [address], [place], [place] [address]   Blood culture (routine x 2)     Status: None (Preliminary result)   Collection Time: 06/20/20  5:18 PM   Specimen: BLOOD  Result Value Ref Range Status   Specimen Description BLOOD LEFT ANTECUBITAL  Final   Special Requests   Final    BOTTLES DRAWN AEROBIC AND ANAEROBIC Blood Culture adequate volume   Culture   Final    NO GROWTH 4 DAYS Performed at [facility], [address], [place], [place] [address]    Report Status PENDING  Incomplete  Blood culture (routine x 2)     Status: None (Preliminary result)   Collection Time: 06/20/20  6:21 PM   Specimen: BLOOD  Result Value Ref Range Status   Specimen Description BLOOD SITE NOT SPECIFIED  Final   Special Requests   Final    BOTTLES DRAWN AEROBIC AND ANAEROBIC Blood Culture adequate volume   Culture   Final    NO GROWTH 4 DAYS Performed at [facility], [address], [place], [place] [address]    Report Status PENDING  Incomplete    Procedures and diagnostic studies:  No results found.  Medications:   . enoxaparin (LOVENOX) injection  55 mg Subcutaneous Q24H  . saccharomyces boulardii  250 mg Oral BID  . sodium chloride flush  3 mL Intravenous Q12H   Continuous Infusions: . sodium chloride 125 mL/hr at 06/23/20 1549  .  methocarbamol (ROBAXIN) IV    . piperacillin-tazobactam (ZOSYN)  IV 3.375 g (06/24/20 0502)     LOS: 4 days   Joseph Art  [facility]   How to contact the [facility] Attending or Consulting provider 7A - 7P or covering provider during after hours 7P -7A, for this patient?  1. Check the care team in [facility] and look for a) attending/consulting [facility] provider listed and b) the [facility] team listed 2. Log into [email or web address] and use [place]'s universal password to access. If you do not have the password, please contact the hospital operator. 3. Locate the [facility] provider you are looking for under [facility] and page to a number that you can be directly reached. 4. If you still have difficulty reaching the provider, please page the [facility] (Director on Call) for the Hospitalists listed on amion for assistance.  06/24/2020, 1:54 PM

## 2020-06-25 ENCOUNTER — Inpatient Hospital Stay (HOSPITAL_COMMUNITY): Payer: BC Managed Care – PPO

## 2020-06-25 LAB — CBC
HCT: 37.4 % (ref 36.0–46.0)
Hemoglobin: 12.6 g/dL (ref 12.0–15.0)
MCH: 29.2 pg (ref 26.0–34.0)
MCHC: 33.7 g/dL (ref 30.0–36.0)
MCV: 86.6 fL (ref 80.0–100.0)
Platelets: 334 10*3/uL (ref 150–400)
RBC: 4.32 MIL/uL (ref 3.87–5.11)
RDW: 12.9 % (ref 11.5–15.5)
WBC: 6.5 10*3/uL (ref 4.0–10.5)
nRBC: 0 % (ref 0.0–0.2)

## 2020-06-25 LAB — BASIC METABOLIC PANEL
Anion gap: 10 (ref 5–15)
BUN: <5 mg/dL — ABNORMAL LOW (ref 6–20)
CO2: 24 mmol/L (ref 22–32)
Calcium: 9.2 mg/dL (ref 8.9–10.3)
Chloride: 106 mmol/L (ref 98–111)
Creatinine, Ser: 1 mg/dL (ref 0.44–1.00)
GFR, Estimated: >60 mL/min (ref >60)
Glucose, Bld: 100 mg/dL — ABNORMAL HIGH (ref 70–99)
Potassium: 3.3 mmol/L — ABNORMAL LOW (ref 3.5–5.1)
Sodium: 140 mmol/L (ref 135–145)

## 2020-06-25 LAB — CULTURE, BLOOD (ROUTINE X 2)
Culture: NO GROWTH
Culture: NO GROWTH
Special Requests: ADEQUATE
Special Requests: ADEQUATE

## 2020-06-25 MED ORDER — AMLODIPINE BESYLATE 10 MG PO TABS
10.0000 mg | ORAL_TABLET | Freq: Every day | ORAL | Status: DC
  Administered 2020-06-25: 10 mg via ORAL
  Filled 2020-06-25 (×3): qty 1

## 2020-06-25 MED ORDER — IOHEXOL 9 MG/ML PO SOLN
500.0000 mL | ORAL | Status: AC
  Administered 2020-06-25 (×2): 500 mL via ORAL

## 2020-06-25 MED ORDER — POTASSIUM CHLORIDE CRYS ER 20 MEQ PO TBCR
40.0000 meq | EXTENDED_RELEASE_TABLET | Freq: Once | ORAL | Status: AC
  Administered 2020-06-25: 40 meq via ORAL
  Filled 2020-06-25: qty 2

## 2020-06-25 MED ORDER — HYDRALAZINE HCL 20 MG/ML IJ SOLN
5.0000 mg | Freq: Once | INTRAMUSCULAR | Status: AC
  Administered 2020-06-25: 5 mg via INTRAVENOUS
  Filled 2020-06-25: qty 1

## 2020-06-25 MED ORDER — IOHEXOL 300 MG/ML  SOLN
100.0000 mL | Freq: Once | INTRAMUSCULAR | Status: AC | PRN
  Administered 2020-06-25: 100 mL via INTRAVENOUS

## 2020-06-25 NOTE — Progress Notes (Signed)
    CC: Recurrent abdominal pain  Subjective: Still having some left lower quadrant discomfort.  Trying to get the contrast down now.  She saw bubbles in the toilet but does not describe pneumouria.   Objective: Vital signs in last 24 hours: Temp:  [97.9 F (36.6 C)-98.7 F (37.1 C)] 98.4 F (36.9 C) (01/01 0522) Pulse Rate:  [85-92] 85 (01/01 0522) Resp:  [17-18] 18 (01/01 0522) BP: (115-125)/(78-88) 116/78 (01/01 0522) SpO2:  [94 %-98 %] 96 % (01/01 0522) Last BM Date: 07/25/20 Nothing p.o. recorded IV x3 recorded Urine x3 recorded No BM recorded Afebrile, vital signs are stable Potassium 3.3, WBC 13.6 >> 11.0 >> 7.7 >> 6.4 >> 6.5 (1/1) Intake/Output from previous day: 12/31 0701 - 01/01 0700 In: 3 [I.V.:3] Out: -  Intake/Output this shift: No intake/output data recorded.  General appearance: alert, cooperative and no distress Resp: clear to auscultation bilaterally GI: Soft, tender in the left lower quadrant and lower abdomen.  Few bowel sounds.  No flatus or BM.  Lab Results:  Recent Labs    06/24/20 0609 06/25/20 0322  WBC 6.4 6.5  HGB 12.4 12.6  HCT 36.1 37.4  PLT 295 334    BMET Recent Labs    06/24/20 0609 06/25/20 0322  NA 142 140  K 3.2* 3.3*  CL 108 106  CO2 23 24  GLUCOSE 100* 100*  BUN <5* <5*  CREATININE 0.83 1.00  CALCIUM 8.8* 9.2   PT/INR No results for input(s): LABPROT, INR in the last 72 hours.  Recent Labs  Lab 06/20/20 1257  AST 16  ALT 17  ALKPHOS 91  BILITOT 1.3*  PROT 7.4  ALBUMIN 3.6     Lipase     Component Value Date/Time   LIPASE 16 06/20/2020 1257     Medications: . enoxaparin (LOVENOX) injection  55 mg Subcutaneous Q24H  . iohexol  500 mL Oral Q1H  . saccharomyces boulardii  250 mg Oral BID  . sodium chloride flush  3 mL Intravenous Q12H    Assessment/Plan Obesity BMI 41.63 HTN  Smoldering sigmoiddiverticulitis with phlegmon - last colonoscopy ~2016 -CT 12/27 shows sigmoid diverticulitis  with phlegmon -She has failed 2 courses of antibiotics since November to treat this diverticulitis - She may be developing a colovesicular fistula - We will repeat a CT scan in the morning to evaluate for any drainable fluid collection -If there is a fluid collection we will consult IR for drainage -If there is no fluid collection we will place her on a course of antibiotics to get her through to an elective sigmoid colectomy in a few weeks.  ID -zosyn 12/27>>              WBC 13.6 >> 11.0 >> 7.7 >> 6.4 >> 6.5 (1/1)             Plan to continue antibiotics until surgery FEN -IVF,CLD VTE -SCDs, lovenox Foley -none Follow up -Dr. Dossie Der   PLAN:  CT pending, continue IV antibiotic      LOS: 5 days    Marwin Primmer 06/25/2020 Please see Amion

## 2020-06-25 NOTE — Progress Notes (Signed)
Progress Note    Deborah Chaney  PJA:[id number] DOB: Oct 11, 1965  DOA: 06/20/2020 PCP: Donald Prose, MD    Brief Narrative:     Medical records reviewed and are as summarized below:  Deborah Chaney is an 55 y.o. female with medical history significant ofhypertension and diverticulitis who presented with complaints left lower quadrant abdominal pain. Initially diagnosed with mid sigmoid diverticulitis .  She returns with severe abdominal pain, had CT scan revealing significant for sigmoid diverticulitis with microperforation.  General surgery consulted.  Slowly improving.  Assessment/Plan:   Principal Problem:   Sepsis ([place]) Active Problems:   Diverticulitis of colon with perforation   Obesity, Class III, BMI 40-49.9 (morbid obesity) ([place])   Sigmoiddiverticulitis with microperforation - last colonoscopy ~2016 Was seen in GI clinic for sigmoid. Diverti. and sent to ER due to concerns for her sx/s.  -CT on 12/27 with a 3.0 x 1.2 cm fluid adjacent to the sigmoid colon may represent inflammatory fluid. Developing abscess is not excluded. --Surgery following.: repeat CT scan in AM  Sepsis -2/2 sigmoid diverticulitis with microperforation: Patient presented with leukocytosis, tachycardia  Lactic acid normal  blood cultures NGTD Continue IV antibiotics  obesity Body mass index is 41.63 kg/m.  HTN -resume home meds  hypokalemia -repleted  Family Communication/Anticipated D/C date and plan/Code Status   DVT prophylaxis: Lovenox ordered. Code Status: Full Code.  Disposition Plan: Status is: Inpatient  Remains inpatient appropriate because:IV treatments appropriate due to intensity of illness or inability to take PO   Dispo: The patient is from: Home              Anticipated d/c is to: Home              Anticipated d/c date is: 24-48 hours              Patient currently is not medically stable to d/c. pending repeat CT scan and possible advancement of  diet         Medical Consultants:    General surgery  GI  Subjective:   C/o mild headache  Objective:    Vitals:   06/24/20 1736 06/25/20 0100 06/25/20 0522 06/25/20 1145  BP: 121/81 115/83 116/78 (!) 149/88  Pulse: 85 92 85 82  Resp: 17 18 18 16   Temp: 97.9 F (36.6 C) 98.7 F (37.1 C) 98.4 F (36.9 C) 97.8 F (36.6 C)  TempSrc: Oral Oral Oral Oral  SpO2: 98% 94% 96% 98%  Weight:      Height:       No intake or output data in the 24 hours ending 06/25/20 1353 Filed Weights   06/20/20 1700  Weight: 110 kg    Exam:   General: Appearance:    Severely obese female in no acute distress     Lungs:      respirations unlabored  Heart:    Normal heart rate. Normal rhythm. No murmurs, rubs, or gallops.   MS:   All extremities are intact.   Neurologic:   Awake, alert, oriented x 3. No apparent focal neurological           defect.    Data Reviewed:   I have personally reviewed following labs and imaging studies:  Labs: Labs show the following:   Basic Metabolic Panel: Recent Labs  Lab 06/20/20 1257 06/21/20 0319 06/22/20 0107 06/24/20 0609 06/25/20 0322  NA 136 136 137 142 140  K 3.6 3.6 3.6 3.2* 3.3*  CL 103  105 107 108 106  CO2 23 22 22 23 24   GLUCOSE 100* 106* 80 100* 100*  BUN 8 5* 5* <5* <5*  CREATININE 0.90 0.96 0.88 0.83 1.00  CALCIUM 9.4 8.3* 8.3* 8.8* 9.2  MG  --   --   --  2.0  --    GFR Estimated Creatinine Clearance: 77.1 mL/min (by C-G formula based on SCr of 1 mg/dL). Liver Function Tests: Recent Labs  Lab 06/20/20 1257  AST 16  ALT 17  ALKPHOS 91  BILITOT 1.3*  PROT 7.4  ALBUMIN 3.6   Recent Labs  Lab 06/20/20 1257  LIPASE 16   No results for input(s): AMMONIA in the last 168 hours. Coagulation profile No results for input(s): INR, PROTIME in the last 168 hours.  CBC: Recent Labs  Lab 06/21/20 0319 06/22/20 0107 06/23/20 0029 06/24/20 0609 06/25/20 0322  WBC 13.6* 11.0* 7.7 6.4 6.5  HGB 11.8* 11.8* 11.5*  12.4 12.6  HCT 35.0* 36.5 33.8* 36.1 37.4  MCV 88.6 89.5 87.8 86.6 86.6  PLT 251 244 259 295 334   Cardiac Enzymes: No results for input(s): CKTOTAL, CKMB, CKMBINDEX, TROPONINI in the last 168 hours. BNP (last 3 results) No results for input(s): PROBNP in the last 8760 hours. CBG: No results for input(s): GLUCAP in the last 168 hours. D-Dimer: No results for input(s): DDIMER in the last 72 hours. Hgb A1c: No results for input(s): HGBA1C in the last 72 hours. Lipid Profile: No results for input(s): CHOL, HDL, LDLCALC, TRIG, CHOLHDL, LDLDIRECT in the last 72 hours. Thyroid function studies: No results for input(s): TSH, T4TOTAL, T3FREE, THYROIDAB in the last 72 hours.  Invalid input(s): FREET3 Anemia work up: No results for input(s): VITAMINB12, FOLATE, FERRITIN, TIBC, IRON, RETICCTPCT in the last 72 hours. Sepsis Labs: Recent Labs  Lab 06/20/20 1645 06/21/20 0319 06/22/20 0107 06/23/20 0029 06/24/20 0609 06/25/20 0322  WBC  --  13.6* 11.0* 7.7 6.4 6.5  LATICACIDVEN 0.9 0.7  --   --   --   --     Microbiology Recent Results (from the past 240 hour(s))  SARS CORONAVIRUS 2 (TAT 6-24 HRS) Nasopharyngeal Nasopharyngeal Swab     Status: None   Collection Time: 06/20/20  3:47 PM   Specimen: Nasopharyngeal Swab  Result Value Ref Range Status   SARS Coronavirus 2 NEGATIVE NEGATIVE Final    Comment: (NOTE) SARS-CoV-2 target nucleic acids are NOT DETECTED.  The SARS-CoV-2 RNA is generally detectable in upper and lower respiratory specimens during the acute phase of infection. Negative results do not preclude SARS-CoV-2 infection, do not rule out co-infections with other pathogens, and should not be used as the sole basis for treatment or other patient management decisions. Negative results must be combined with clinical observations, patient history, and epidemiological information. The expected result is Negative.  Fact Sheet for  Patients: 06/22/20  Fact Sheet for Healthcare Providers: HairSlick.no  This test is not yet approved or cleared by the quierodirigir.[employer] FDA and  has been authorized for detection and/or diagnosis of SARS-CoV-2 by FDA under an Emergency Use Authorization (EUA). This EUA will remain  in effect (meaning this test can be used) for the duration of the COVID-19 declaration under Se ction 564(b)(1) of the Act, 21 U.S.C. section 360bbb-3(b)(1), unless the authorization is terminated or revoked sooner.  Performed at [facility], [address], [place], [place]   Blood culture (routine x 2)     Status: None   Collection  Time: 06/20/20  5:18 PM   Specimen: BLOOD  Result Value Ref Range Status   Specimen Description BLOOD LEFT ANTECUBITAL  Final   Special Requests   Final    BOTTLES DRAWN AEROBIC AND ANAEROBIC Blood Culture adequate volume   Culture   Final    NO GROWTH 5 DAYS Performed at [facility], [address] [place], [place] [address]    Report Status 06/25/2020 FINAL  Final  Blood culture (routine x 2)     Status: None   Collection Time: 06/20/20  6:21 PM   Specimen: BLOOD  Result Value Ref Range Status   Specimen Description BLOOD SITE NOT SPECIFIED  Final   Special Requests   Final    BOTTLES DRAWN AEROBIC AND ANAEROBIC Blood Culture adequate volume   Culture   Final    NO GROWTH 5 DAYS Performed at [facility], [address] [place], [place] [address]    Report Status 06/25/2020 FINAL  Final    Procedures and diagnostic studies:  No results found.  Medications:    amLODipine  10 mg Oral Daily   enoxaparin (LOVENOX) injection  55 mg Subcutaneous Q24H   saccharomyces boulardii  250 mg Oral BID   sodium chloride flush  3 mL Intravenous Q12H   Continuous Infusions:  methocarbamol (ROBAXIN) IV     piperacillin-tazobactam (ZOSYN)  IV 3.375 g (06/25/20 0535)      LOS: 5 days   Joseph Art  [facility]   How to contact the [facility] Attending or Consulting provider 7A - 7P or covering provider during after hours 7P -7A, for this patient?  1. Check the care team in [facility] and look for a) attending/consulting [facility] provider listed and b) the [facility] team listed 2. Log into [email or web address] and use [place]'s universal password to access. If you do not have the password, please contact the hospital operator. 3. Locate the [facility] provider you are looking for under [facility] and page to a number that you can be directly reached. 4. If you still have difficulty reaching the provider, please page the [facility] (Director on Call) for the Hospitalists listed on amion for assistance.  06/25/2020, 1:53 PM

## 2020-06-26 LAB — CBC
HCT: 38.6 % (ref 36.0–46.0)
Hemoglobin: 12.6 g/dL (ref 12.0–15.0)
MCH: 28.5 pg (ref 26.0–34.0)
MCHC: 32.6 g/dL (ref 30.0–36.0)
MCV: 87.3 fL (ref 80.0–100.0)
Platelets: 352 10*3/uL (ref 150–400)
RBC: 4.42 MIL/uL (ref 3.87–5.11)
RDW: 13.1 % (ref 11.5–15.5)
WBC: 7.5 10*3/uL (ref 4.0–10.5)
nRBC: 0 % (ref 0.0–0.2)

## 2020-06-26 LAB — BASIC METABOLIC PANEL
Anion gap: 10 (ref 5–15)
BUN: <5 mg/dL — ABNORMAL LOW (ref 6–20)
CO2: 23 mmol/L (ref 22–32)
Calcium: 9 mg/dL (ref 8.9–10.3)
Chloride: 106 mmol/L (ref 98–111)
Creatinine, Ser: 1.02 mg/dL — ABNORMAL HIGH (ref 0.44–1.00)
GFR, Estimated: >60 mL/min (ref >60)
Glucose, Bld: 101 mg/dL — ABNORMAL HIGH (ref 70–99)
Potassium: 3.6 mmol/L (ref 3.5–5.1)
Sodium: 139 mmol/L (ref 135–145)

## 2020-06-26 MED ORDER — AMLODIPINE BESYLATE 10 MG PO TABS
10.0000 mg | ORAL_TABLET | Freq: Every day | ORAL | Status: DC
  Administered 2020-06-26: 10 mg via ORAL

## 2020-06-26 MED ORDER — AMOXICILLIN-POT CLAVULANATE 875-125 MG PO TABS: 1.0000 | ORAL_TABLET | Freq: Two times a day (BID) | ORAL | 0 refills | Status: DC

## 2020-06-26 MED ORDER — AMOXICILLIN-POT CLAVULANATE 875-125 MG PO TABS
1.0000 | ORAL_TABLET | Freq: Two times a day (BID) | ORAL | Status: DC
  Filled 2020-06-26 (×2): qty 1

## 2020-06-26 MED ORDER — SACCHAROMYCES BOULARDII 250 MG PO CAPS: 250.0000 mg | ORAL_CAPSULE | Freq: Two times a day (BID) | ORAL | 0 refills | Status: DC

## 2020-06-26 NOTE — Progress Notes (Signed)
Progress Note     Subjective: Patient denies abdominal pain this AM. Tolerating FLD and having bowel function. We discussed plan for possible elective colon resection in a few weeks.   Objective: Vital signs in last 24 hours: Temp:  [97.8 F (36.6 C)-98.6 F (37 C)] 98 F (36.7 C) (01/02 0524) Pulse Rate:  [79-86] 83 (01/02 0524) Resp:  [16-18] 18 (01/02 0524) BP: (104-149)/(72-88) 111/85 (01/02 0524) SpO2:  [95 %-98 %] 96 % (01/02 0524) Last BM Date: 07/25/20  Intake/Output from previous day: 01/01 0701 - 01/02 0700 In: 240 [P.O.:240] Out: -  Intake/Output this shift: No intake/output data recorded.  PE: General: pleasant, WD, obese female who is laying in bed in NAD Heart: regular, rate, and rhythm.  Lungs:  Respiratory effort nonlabored Abd: soft, NT, ND, +BS, no masses, hernias, or organomegaly     Lab Results:  Recent Labs    06/25/20 0322 06/26/20 0210  WBC 6.5 7.5  HGB 12.6 12.6  HCT 37.4 38.6  PLT 334 352   BMET Recent Labs    06/25/20 0322 06/26/20 0210  NA 140 139  K 3.3* 3.6  CL 106 106  CO2 24 23  GLUCOSE 100* 101*  BUN <5* <5*  CREATININE 1.00 1.02*  CALCIUM 9.2 9.0   PT/INR No results for input(s): LABPROT, INR in the last 72 hours. CMP     Component Value Date/Time   NA 139 06/26/2020 0210   K 3.6 06/26/2020 0210   CL 106 06/26/2020 0210   CO2 23 06/26/2020 0210   GLUCOSE 101 (H) 06/26/2020 0210   BUN <5 (L) 06/26/2020 0210   CREATININE 1.02 (H) 06/26/2020 0210   CALCIUM 9.0 06/26/2020 0210   PROT 7.4 06/20/2020 1257   ALBUMIN 3.6 06/20/2020 1257   AST 16 06/20/2020 1257   ALT 17 06/20/2020 1257   ALKPHOS 91 06/20/2020 1257   BILITOT 1.3 (H) 06/20/2020 1257   GFRNONAA >60 06/26/2020 0210   GFRAA >60 02/10/2019 0933   Lipase     Component Value Date/Time   LIPASE 16 06/20/2020 1257       Studies/Results: CT ABDOMEN PELVIS W CONTRAST  Result Date: 06/25/2020 CLINICAL DATA:  Diverticulitis. EXAM: CT ABDOMEN AND  PELVIS WITH CONTRAST TECHNIQUE: Multidetector CT imaging of the abdomen and pelvis was performed using the standard protocol following bolus administration of intravenous contrast. CONTRAST:  OMNIPAQUE IOHEXOL 300 MG/ML  SOLN COMPARISON:  June 20, 2020. FINDINGS: Lower chest: No acute abnormality. Hepatobiliary: No focal liver abnormality is seen. No gallstones, gallbladder wall thickening, or biliary dilatation. Pancreas: Unremarkable. No pancreatic ductal dilatation or surrounding inflammatory changes. Spleen: Normal in size without focal abnormality. Adrenals/Urinary Tract: Adrenal glands are unremarkable. Kidneys are normal, without renal calculi, focal lesion, or hydronephrosis. Bladder is unremarkable. Stomach/Bowel: Status post appendectomy. The stomach is unremarkable. There is no evidence of bowel obstruction. Diverticulosis of descending and sigmoid colon is noted. Stable inflammatory changes are seen involving the proximal sigmoid colon consistent with diverticulitis. Small amount air is noted in the surrounding peritoneal fat suggesting micro perforation. No definite abscess or fluid collection is noted. Vascular/Lymphatic: No significant vascular findings are present. No enlarged abdominal or pelvic lymph nodes. Reproductive: Status post hysterectomy. No adnexal masses. Other: No abdominal wall hernia or abnormality. No abdominopelvic ascites. Musculoskeletal: No acute or significant osseous findings. IMPRESSION: Stable inflammatory changes are seen involving the proximal sigmoid colon consistent with diverticulitis. Small amount of air is noted in the surrounding peritoneal fat suggesting  micro perforation. No definite abscess or fluid collection is noted. Electronically Signed   By: Lupita Raider M.D.   On: 06/25/2020 14:05    Anti-infectives: Anti-infectives (From admission, onward)   Start     Dose/Rate Route Frequency Ordered Stop   06/20/20 2200  piperacillin-tazobactam (ZOSYN)  IVPB 3.375 g  Status:  Discontinued        3.375 g 100 mL/hr over 30 Minutes Intravenous Every 8 hours 06/20/20 1715 06/20/20 1718   06/20/20 2130  piperacillin-tazobactam (ZOSYN) IVPB 3.375 g        3.375 g 12.5 mL/hr over 240 Minutes Intravenous Every 8 hours 06/20/20 1719     06/20/20 1530  piperacillin-tazobactam (ZOSYN) IVPB 3.375 g        3.375 g 100 mL/hr over 30 Minutes Intravenous  Once 06/20/20 1517 06/20/20 1641       Assessment/Plan Obesity BMI 41.63 HTN  Smoldering sigmoiddiverticulitis withphlegmon - last colonoscopy ~2016 -CT 12/27 shows sigmoid diverticulitis withphlegmon - She has failed 2 courses of antibiotics since November to treat this diverticulitis -CT yesterday without abscess or fluid collection but likely microperforation  - WBC normalized and no fevers - tolerating FLD and having bowel function  - Discussed with patient who is agreeable to discharge home on PO abx with plans for outpatient surgery in a few weeks.   ID -zosyn 12/27>>  Plan to continue antibiotics until surgery FEN -FLD - ok to have soft  VTE -SCDs, lovenox Foley -none Follow up -Dr. Dossie Der    LOS: 6 days    Juliet Rude , [facility] Surgery 06/26/2020, 11:05 AM Please see Amion for pager number during day hours 7:00am-4:30pm

## 2020-06-26 NOTE — Discharge Instructions (Signed)
Low-Fiber Eating Plan Fiber is found in fruits, vegetables, whole grains, and beans. Eating a diet low in fiber helps to reduce how often you have bowel movements and how much you produce during a bowel movement. A low-fiber eating plan may help your digestive system heal if:  You have certain conditions, such as Crohn's disease or diverticulitis.  You recently had radiation therapy on your pelvis or bowel.  You recently had intestinal surgery.  You have a new surgical opening in your abdomen (colostomy or ileostomy).  Your intestine is narrowed (stricture). Your health care provider will determine how long you need to stay on this diet. Your health care provider may recommend that you work with a diet and nutrition specialist (dietitian). What are tips for following this plan? General guidelines  Follow recommendations from your dietitian about how much fiber you should have each day.  Most people on this eating plan should try to eat less than 10 grams (g) of fiber each day. Your daily fiber goal is _________________ g.  Take vitamin and mineral supplements as told by your health care provider or dietitian. Chewable or liquid forms are best when on this eating plan. Reading food labels  Check food labels for the amount of dietary fiber.  Choose foods that have less than 2 grams of fiber in one serving. Cooking  Use white flour and other allowed grains for baking and cooking.  Cook meat using methods that keep it tender, such as braising or poaching.  Cook eggs until the yolk is completely solid.  Cook with healthy oils, such as olive oil or canola oil. Meal planning   Eat 5-6 small meals throughout the day instead of 3 large meals.  If you are lactose intolerant: ? Choose low-lactose dairy foods. ? Do not eat dairy foods, if told by your dietitian.  Limit fat and oils to less than 8 teaspoons a day.  Eat small portions of desserts. What foods are allowed? The items  listed below may not be a complete list. Talk with your dietitian about what dietary choices are best for you. Grains All bread and crackers made with white flour. Waffles, pancakes, and [place] toast. Bagels. Pretzels. Melba toast, zwieback, and matzoh. Cooked and dried cereals that do not contain whole grains, added fiber, seeds, or dried fruit. CornmealDomenick Gong. Hot and cold cereals made with refined corn, wheat, rice, or oats. Plain pasta and noodles. White rice. Vegetables Well-cooked or canned vegetables without skin, seeds, or stems. Cooked potatoes without skins. Vegetable juice. Fruits Soft-cooked or canned fruits without skin and seeds. Peeled ripe banana. Applesauce. Fruit juice without pulp. Meats and other protein foods Ground meat. Tender cuts of meat or poultry. Eggs. Fish, seafood, and shellfish. Smooth nut butters. Tofu. Dairy All milk products and drinks. Lactose-free milks, including rice, soy, and almond milks. Yogurt without fruit, nuts, chocolate, or granola mix-ins. Sour cream. Cottage cheese. Cheese. Beverages Decaf coffee. Fruit and vegetable juices or smoothies (in small amounts, with no pulp or skins, and with fruits from allowed list). Sports drinks. Herbal tea. Fats and oils Olive oil, canola oil, sunflower oil, flaxseed oil, and grapeseed oil. Mayonnaise. Cream cheese. Margarine. Butter. Sweets and desserts Plain cakes and cookies. Cream pies and pies made with allowed fruits. Pudding. Custard. Fruit gelatin. Sherbet. Popsicles. Ice cream without nuts. Plain hard candy. Honey. Jelly. Molasses. Syrups, including chocolate syrup. Chocolate. Marshmallows. Gumdrops. Seasoning and other foods Bouillon. Broth. Cream soups made from allowed foods. Strained soup. Casseroles made  with allowed foods. Ketchup. Mild mustard. Mild salad dressings. Plain gravies. Vinegar. Spices in moderation. Salt. Sugar. What foods are not allowed? The items listed below may not be a complete  list. Talk with your dietitian about what dietary choices are best for you. Grains Whole wheat and whole grain breads and crackers. Multigrain breads and crackers. Rye bread. Whole grain or multigrain cereals. Cereals with nuts, raisins, or coconut. Bran. Coarse wheat cereals. Granola. High-fiber cereals. Cornmeal or corn bread. Whole grain pasta. Wild or brown rice. Quinoa. Popcorn. Buckwheat. Wheat germ. Vegetables Potato skins. Raw or undercooked vegetables. All beans and bean sprouts. Cooked greens. Corn. Peas. Cabbage. Beets. Broccoli. Brussels sprouts. Cauliflower. Mushrooms. Onions. Peppers. Parsnips. Okra. Sauerkraut. Fruit Raw or dried fruit. Berries. Fruit juice with pulp. Prune juice. Meats and other protein foods Tough, fibrous meats with gristle. Fatty meat. Poultry with skin. Fried meat, Sales executive, or fish. Deli or lunch meats. Sausage, bacon, and hot dogs. Nuts and chunky nut butter. Dried peas, beans, and lentils. Dairy Yogurt with fruit, nuts, chocolate, or granola mix-ins. Beverages Caffeinated coffee and teas. Fats and oils Avocado. Coconut. Sweets and desserts Desserts, cookies, or candies that contain nuts or coconut. Dried fruit. Jams and preserves with seeds. Marmalade. Any dessert made with fruits or grains that are not allowed. Seasoning and other foods Corn tortilla chips. Soups made with vegetables or grains that are not allowed. Relish. Horseradish. Deborah Chaney. Olives. Summary  Most people on a low-fiber eating plan should eat less than 10 grams of fiber a day. Follow recommendations from your dietitian about how much fiber you should have each day.  Always check food labels to see the dietary fiber content of packaged foods. In general, a low-fiber food will have fewer than 2 grams of fiber per serving.  In general, try to avoid whole grains, raw fruits and vegetables, dried fruit, tough cuts of meat, nuts, and seeds.  Take a vitamin and mineral supplement as told  by your health care provider or dietitian. This information is not intended to replace advice given to you by your health care provider. Make sure you discuss any questions you have with your health care provider. Document Revised: 10/03/2018 Document Reviewed: 08/14/2016 Elsevier Patient Education  [place].   Probiotics Probiotics are the good bacteria and yeasts that live in your body and keep your digestive system healthy. Probiotics also help your body's defense system (immune system) and protect your body against the growth of harmful bacteria. Your health care provider may recommend taking a probiotic if you are taking antibiotics or have certain medical conditions, such as:  Diarrhea.  Constipation.  Irritable bowel syndrome.  Lung infections.  Yeast infections.  Acne, eczema, and other skin conditions.  Frequent urinary tract infections. What affects the balance of bacteria in my body? The balance of good bacteria in your body can be affected by:  Antibiotic medicines. These medicines treat infections caused by bacteria. Unfortunately, they may kill the good bacteria in your body as well as the bad bacteria.  Certain medical conditions. Conditions related to an imbalance of bacteria include: ? Stomach and intestine (gastrointestinal) infections. ? Lung infections. ? Skin infections. ? Vaginal infections. ? Inflammatory bowel diseases. ? Stomach ulcers (gastric ulcers). ? Tooth decay and gum disease (periodontal disease).  Stress.  Poor diet. What type of probiotic is right for me? Probiotics contain different types of bacteria (strains). Strains commonly found in probiotics include:  Lactobacillus.  Saccharomyces.  Bifidobacterium. Specific strains have been  shown to be more effective for certain health conditions. Ask your health care provider which strain or strains you should use and how often. Probiotics come in many different forms, strain  combinations, and strengths. Some may need to be refrigerated. Always read the label for storage and usage instructions. Certain foods, such as yogurt, contain probiotics. Probiotics can also be bought as a supplement at a pharmacy, health food store, or grocery store. Talk to your health care provider before starting any supplement. What are the side effects of probiotics? Some people have side effects when taking probiotics. Side effects are usually temporary and may include:  Gas.  Bloating.  Cramping. Serious side effects are rare. Follow these instructions at home:   If you are taking probiotics with antibiotics: ? Wait at least 2 hours between taking your medicine and the probiotic. ? Eat foods high in fiber, such as whole grains, beans, and vegetables. These foods can help good bacteria grow. ? Avoid certain foods as told by your health care provider. Summary  Probiotics are the good bacteria and yeasts that live in your body and keep you and your digestive system healthy.  Certain foods, such as yogurt, contain probiotics.  Probiotics can be taken as supplements. They can be bought at a pharmacy, health food store, or grocery store. They come in many different forms, strain combinations, and strengths.  Be sure to talk with your health care provider before taking a probiotic supplement. This information is not intended to replace advice given to you by your health care provider. Make sure you discuss any questions you have with your health care provider. Document Revised: 02/28/2018 Document Reviewed: 06/26/2017 Elsevier Patient Education  2020 [employer].   Open Colectomy An open colectomy is surgery to remove part or all of the large intestine (colon). This procedure may be used to treat several conditions, including:  Inflammation and infection of the colon (diverticulitis).  Tumors or masses in the colon.  Inflammatory bowel disease, such as Crohn disease or  ulcerative colitis.  Bleeding from the colon.  Blockage or obstruction of the colon. Tell a health care provider about:  Any allergies you have.  All medicines you are taking, including vitamins, herbs, eye drops, creams, and over-the-counter medicines.  Any problems you or family members have had with anesthetic medicines.  Any blood disorders you have.  Any surgeries you have had.  Any medical conditions you have.  Whether you are pregnant or may be pregnant.  Whether you smoke or use tobacco products. These can affect your body's reaction to anesthesia. What are the risks? Generally, this is a safe procedure. However, problems may occur, including:  Infection.  Bleeding.  Allergic reactions to medicines.  Damage to other structures or organs.  Pneumonia.  The incision opening up.  Tissues from inside the abdomen bulging through the incision (hernia).  Reopening of the colon where it was stitched or stapled together.  A blood clot forming in a vein and traveling to the lungs.  Future blockage of the small intestine from scar tissue. What happens before the procedure? Staying hydrated Follow instructions from your health care provider about hydration, which may include:  Up to 2 hours before the procedure - you may continue to drink clear liquids, such as water, clear fruit juice, black coffee, and plain tea. Eating and drinking restrictions Follow instructions from your health care provider about eating and drinking, which may include:  8 hours before the procedure - stop eating heavy  meals or foods such as meat, fried foods, or fatty foods.  6 hours before the procedure - stop eating light meals or foods, such as toast or cereal.  6 hours before the procedure - stop drinking milk or drinks that contain milk.  2 hours before the procedure - stop drinking clear liquids. Bowel prep In some cases, you may be prescribed an oral bowel prep to clean out your  colon. If so:  Take it as told by your health care provider. Starting the day before your procedure, you may need to drink a large amount of medicated liquid. The liquid will cause you to have multiple loose stools until your stool is almost clear or light green.  Follow instructions from your health care provider about eating and drinking restrictions during bowel prep. Medicines  Ask your health care provider about: ? Changing or stopping your regular medicines or vitamins. This is especially important if you are taking diabetes medicines, blood thinners, or vitamin E. ? Taking medicines such as aspirin and ibuprofen. These medicines can thin your blood. Do not take these medicines before your procedure if your health care provider instructs you not to.  If you were prescribed an antibiotic medicine, take it as told by your health care provider. General instructions  Bring loose-fitting, comfortable clothing and slip-on shoes that you can put on without bending over.  Make sure to see your health care provider for any tests that you need before the procedure, such as: ? Blood tests. ? A test to check the heart's rhythm (electrocardiogram, ECG). ? A CT scan of your abdomen. ? Urine tests. ? Colonoscopy.  Plan to have someone take you home from the hospital or clinic.  Arrange for someone to help you with your activities during your recovery. What happens during the procedure?  To reduce your risk of infection: ? Your health care team will wash or sanitize their hands. ? Your skin will be washed with soap. ? Hair may be removed from the surgical area.  An IV tube will be inserted into one of your veins. The tube will be used to give you medicines and fluids.  You will be given a medicine to make you fall asleep (general anesthetic). You may also be given a medicine to help you relax (sedative).  Small monitors will be connected to your body. They will be used to check your heart,  blood pressure, and oxygen level.  A breathing tube may be placed into your lungs during the procedure.  A thin, flexible tube (catheter) will be placed into your bladder to drain urine.  A tube may be inserted through your nose and into your stomach (nasogastric tube, or NG tube). The tube is used to remove stomach fluids after surgery until the intestines start working again.  An incision will be made in your abdomen.  Clamps or staples will be put on your colon.  The part of the colon between the clamps or staples will be removed.  The ends of the colon that remain will be stitched or stapled together.  The incision in your abdomen will be closed with stitches (sutures) or staples.  The incision will be covered with a bandage (dressing).  A small opening (stoma) may be created in your lower abdomen. A removable, external pouch (ostomy pouch) will be attached to the stoma. This pouch will collect stool outside of your body. Stool passes through the stoma and into the pouch instead of through your anus. The  procedure may vary among health care providers and hospitals. What happens after the procedure?  Your blood pressure, heart rate, breathing rate, and blood oxygen level will be monitored until the medicines you were given have worn off.  You may continue to receive fluids and medicines through an IV tube.  You will start on a clear liquid diet and gradually go back to a normal diet.  Do not drive until your health care provider approves.  You may have some pain in your abdomen. You will be given pain medicine to control the pain.  You will be encouraged to do the following: ? Do breathing exercises to prevent pneumonia. ? Get up and start walking within a day after surgery. You should try to get up 5-6 times a day. This information is not intended to replace advice given to you by your health care provider. Make sure you discuss any questions you have with your health care  provider. Document Revised: 12/05/2018 Document Reviewed: 03/12/2016 Elsevier Patient Education  2020 [employer].

## 2020-06-26 NOTE — Discharge Summary (Signed)
Physician Discharge Summary  Deborah Chaney VEH:[id number] DOB: 1965/08/01 DOA: 06/20/2020  PCP: Deatra James, MD  Admit date: 06/20/2020 Discharge date: 06/26/2020  Admitted From: home Discharge disposition: home   Recommendations for Outpatient Follow-Up:   4 weeks of antibiotics and open sigmoid colectomy with possible colostomy at the conclusion of that time.    Discharge Diagnosis:   Principal Problem:   Sepsis (HCC) Active Problems:   Diverticulitis of colon with perforation   Obesity, Class III, BMI 40-49.9 (morbid obesity) (HCC)    Discharge Condition: Improved.  Diet recommendation: soft/low residue  Wound care: None.  Code status: Full.   History of Present Illness:   Deborah Chaney is a 55 y.o. female with medical history significant of hypertension and diverticulitis who presented with complaints left lower quadrant abdominal pain.  Initially diagnosed with mid sigmoid diverticulitis on November 8 at [facility] discharged home on a 10-day course of ciprofloxacin and Flagyl.  She initially had improvement in symptoms until after Thanksgiving.  She was seen back in the emergency department on December 5 where imaging revealed sigmoid colonic diverticulitis.  Patient was given 10-day course of Augmentin and referred to gastroenterology for colonoscopy.  She followed up in their clinic on 12/20, but was still noted to have some pain for which she was continued on Augmentin.  Yesterday, morning she woke up with severe left lower quadrant abdominal pain.  She describes it as someone trying to rip her intestines out of her body.  She could hardly move due to the pain.  When she was finally able to get up and go to the bathroom she felt really hot and sweaty and ended up on the floor.  She does not feel like she lost consciousness or sustaining any injury.  Patient laid on the floor until she was able to get back up eventually, but did not go to the  hospital at that time because they were in [place], [place].  Patient reported associated symptoms of intermittent subjective fever/chills, nausea, and loose stools.  Denies any blood in stools.  She called get an appointment to see the gastroenterologist this morning at 10:45 AM.  She was seen in the office and advised to come to the hospital for further evaluation.   Hospital Course by Problem:   Sigmoiddiverticulitis with microperforation - last colonoscopy ~2016 Was seen in GI clinic for sigmoid. Diverti. and sent to ER due to concerns for her sx/s.  -CT on 12/27 with a3.0 x 1.2 cm fluid adjacent to the sigmoid colon may represent inflammatory fluid. Developing abscess is not excluded. --Surgery following.: repeat CT scan: without abscess or fluid collection but likely microperforation  -4 weeks of antibiotics and open sigmoid colectomy with possible colostomy at the conclusion of that time.    Sepsis-2/2 sigmoid diverticulitis with microperforation: Patient presented with leukocytosis, tachycardia  Lactic acid normal  blood cultures NGTD   obesity Body mass index is 41.63 kg/m.  HTN -resume home meds  hypokalemia -repleted    Medical Consultants:   GI General surgery   Discharge Exam:   Vitals:   06/26/20 0030 06/26/20 0524  BP: 104/72 111/85  Pulse: 86 83  Resp: 18 18  Temp: 98.3 F (36.8 C) 98 F (36.7 C)  SpO2: 95% 96%   Vitals:   06/25/20 1145 06/25/20 1703 06/26/20 0030 06/26/20 0524  BP: (!) 149/88 124/74 104/72 111/85  Pulse: 82 79 86 83  Resp: 16 18  18 18  Temp: 97.8 F (36.6 C) 98.6 F (37 C) 98.3 F (36.8 C) 98 F (36.7 C)  TempSrc: Oral Oral Oral Oral  SpO2: 98% 98% 95% 96%  Weight:      Height:        General exam: Appears calm and comfortable.  The results of significant diagnostics from this hospitalization (including imaging, microbiology, ancillary and laboratory) are listed below for reference.     Procedures  and Diagnostic Studies:   CT ABDOMEN PELVIS W CONTRAST  Result Date: 06/20/2020 CLINICAL DATA:  55 year old female with abdominal pain. Concern for acute diverticulitis. EXAM: CT ABDOMEN AND PELVIS WITH CONTRAST TECHNIQUE: Multidetector CT imaging of the abdomen and pelvis was performed using the standard protocol following bolus administration of intravenous contrast. CONTRAST:  OMNIPAQUE IOHEXOL 300 MG/ML  SOLN COMPARISON:  CT abdomen pelvis dated 05/29/2020. FINDINGS: Lower chest: The visualized lung bases are clear. No intra-abdominal free air. Trace free fluid in the pelvis. Hepatobiliary: Probable mild fatty liver. No intrahepatic biliary ductal dilatation. The gallbladder is unremarkable. Pancreas: Unremarkable. No pancreatic ductal dilatation or surrounding inflammatory changes. Spleen: Several subcentimeter splenic hypodense lesions are too small to characterize. The spleen is otherwise unremarkable. Adrenals/Urinary Tract: The adrenal glands unremarkable. The kidneys, visualized ureters, and urinary bladder appear unremarkable. Stomach/Bowel: There is sigmoid diverticulosis. There is active inflammatory changes involving the distal descending/sigmoid junction consistent with acute diverticulitis. Small pockets of air adjacent to the sigmoid colon consistent with focally contained micro perforation. A 3.0 x 1.2 cm fluid adjacent to the sigmoid colon may represent inflammatory fluid. Developing abscess is not excluded. No drainable fluid collection identified at this time. There is diffuse colonic diverticula. There is no bowel obstruction. Appendectomy. Vascular/Lymphatic: The abdominal aorta and IVC are unremarkable. No portal venous gas. There is no adenopathy. Reproductive: Hysterectomy. No adnexal masses. Other: None Musculoskeletal: No acute or significant osseous findings. IMPRESSION: Sigmoid diverticulitis with focally contained micro perforation. No drainable fluid collection.  Electronically Signed   By: Elgie Collard M.D.   On: 06/20/2020 16:30     Labs:   Basic Metabolic Panel: Recent Labs  Lab 06/21/20 0319 06/22/20 0107 06/24/20 0609 06/25/20 0322 06/26/20 0210  NA 136 137 142 140 139  K 3.6 3.6 3.2* 3.3* 3.6  CL 105 107 108 106 106  CO2 22 22 23 24 23   GLUCOSE 106* 80 100* 100* 101*  BUN 5* 5* <5* <5* <5*  CREATININE 0.96 0.88 0.83 1.00 1.02*  CALCIUM 8.3* 8.3* 8.8* 9.2 9.0  MG  --   --  2.0  --   --    GFR Estimated Creatinine Clearance: 75.6 mL/min (A) (by C-G formula based on SCr of 1.02 mg/dL (H)). Liver Function Tests: Recent Labs  Lab 06/20/20 1257  AST 16  ALT 17  ALKPHOS 91  BILITOT 1.3*  PROT 7.4  ALBUMIN 3.6   Recent Labs  Lab 06/20/20 1257  LIPASE 16   No results for input(s): AMMONIA in the last 168 hours. Coagulation profile No results for input(s): INR, PROTIME in the last 168 hours.  CBC: Recent Labs  Lab 06/22/20 0107 06/23/20 0029 06/24/20 0609 06/25/20 0322 06/26/20 0210  WBC 11.0* 7.7 6.4 6.5 7.5  HGB 11.8* 11.5* 12.4 12.6 12.6  HCT 36.5 33.8* 36.1 37.4 38.6  MCV 89.5 87.8 86.6 86.6 87.3  PLT 244 259 295 334 352   Cardiac Enzymes: No results for input(s): CKTOTAL, CKMB, CKMBINDEX, TROPONINI in the last 168 hours. BNP: Invalid  input(s): POCBNP CBG: No results for input(s): GLUCAP in the last 168 hours. D-Dimer No results for input(s): DDIMER in the last 72 hours. Hgb A1c No results for input(s): HGBA1C in the last 72 hours. Lipid Profile No results for input(s): CHOL, HDL, LDLCALC, TRIG, CHOLHDL, LDLDIRECT in the last 72 hours. Thyroid function studies No results for input(s): TSH, T4TOTAL, T3FREE, THYROIDAB in the last 72 hours.  Invalid input(s): FREET3 Anemia work up No results for input(s): VITAMINB12, FOLATE, FERRITIN, TIBC, IRON, RETICCTPCT in the last 72 hours. Microbiology Recent Results (from the past 240 hour(s))  SARS CORONAVIRUS 2 (TAT 6-24 HRS) Nasopharyngeal  Nasopharyngeal Swab     Status: None   Collection Time: 06/20/20  3:47 PM   Specimen: Nasopharyngeal Swab  Result Value Ref Range Status   SARS Coronavirus 2 NEGATIVE NEGATIVE Final    Comment: (NOTE) SARS-CoV-2 target nucleic acids are NOT DETECTED.  The SARS-CoV-2 RNA is generally detectable in upper and lower respiratory specimens during the acute phase of infection. Negative results do not preclude SARS-CoV-2 infection, do not rule out co-infections with other pathogens, and should not be used as the sole basis for treatment or other patient management decisions. Negative results must be combined with clinical observations, patient history, and epidemiological information. The expected result is Negative.  Fact Sheet for Patients: HairSlick.no  Fact Sheet for Healthcare Providers: quierodirigir.com  This test is not yet approved or cleared by the [place] FDA and  has been authorized for detection and/or diagnosis of SARS-CoV-2 by FDA under an Emergency Use Authorization (EUA). This EUA will remain  in effect (meaning this test can be used) for the duration of the COVID-19 declaration under Se ction 564(b)(1) of the Act, 21 U.S.C. section 360bbb-3(b)(1), unless the authorization is terminated or revoked sooner.  Performed at [facility], [address] [place], [place] [address]   Blood culture (routine x 2)     Status: None   Collection Time: 06/20/20  5:18 PM   Specimen: BLOOD  Result Value Ref Range Status   Specimen Description BLOOD LEFT ANTECUBITAL  Final   Special Requests   Final    BOTTLES DRAWN AEROBIC AND ANAEROBIC Blood Culture adequate volume   Culture   Final    NO GROWTH 5 DAYS Performed at [facility], [address], [place], [place] [address]    Report Status 06/25/2020 FINAL  Final  Blood culture (routine x 2)     Status: None   Collection Time: 06/20/20  6:21 PM   Specimen:  BLOOD  Result Value Ref Range Status   Specimen Description BLOOD SITE NOT SPECIFIED  Final   Special Requests   Final    BOTTLES DRAWN AEROBIC AND ANAEROBIC Blood Culture adequate volume   Culture   Final    NO GROWTH 5 DAYS Performed at [facility], [address] [place], [place] [address]    Report Status 06/25/2020 FINAL  Final     Discharge Instructions:   Discharge Instructions    Discharge instructions   Complete by: As directed    Soft, low residue diet   Increase activity slowly   Complete by: As directed      Allergies as of 06/26/2020      Reactions   Penicillins Other (See Comments)   Head feels tight Face tightens up   Shrimp (diagnostic) Itching   Throat itching      Medication List    STOP taking  these medications   ibuprofen 200 MG tablet Commonly known as: ADVIL     TAKE these medications   acetaminophen 500 MG tablet Commonly known as: TYLENOL Take 500-1,000 mg by mouth every 6 (six) hours as needed for headache (pain).   amLODipine 10 MG tablet Commonly known as: NORVASC Take 10 mg by mouth daily.   amoxicillin-clavulanate 875-125 MG tablet Commonly known as: AUGMENTIN Take 1 tablet by mouth every 12 (twelve) hours. What changed: when to take this   aspirin EC 81 MG tablet Take 81 mg by mouth daily. Swallow whole.   ergocalciferol 1.25 MG (50000 UT) capsule Commonly known as: VITAMIN D2 Take 50,000 Units by mouth every Monday.   saccharomyces boulardii 250 MG capsule Commonly known as: FLORASTOR Take 1 capsule (250 mg total) by mouth 2 (two) times daily.       Follow-up Information    Stechschulte, Nickola Major, MD Follow up.   Specialty: Surgery Why: Our office should be working on scheduling surgery. Please call if you have not heard from them within 1 week.  Contact information: [place] [place] [address] [phone]        Donald Prose, MD Follow up in 1 week(s).   Specialty: Family  Medicine Contact information: [address], [place] [address] [phone]                Time coordinating discharge: 35 min  Signed:  Geradine Girt DO  [facility] 06/26/2020, 11:51 AM

## 2020-07-05 ENCOUNTER — Ambulatory Visit: Payer: Self-pay | Admitting: Surgery

## 2020-07-05 ENCOUNTER — Encounter (HOSPITAL_COMMUNITY)
Admission: RE | Admit: 2020-07-05 | Discharge: 2020-07-05 | Disposition: A | Discharge status: Home / Self Care | Payer: BC Managed Care – PPO | Source: Ambulatory Visit | Attending: Surgery | Admitting: Surgery

## 2020-07-05 ENCOUNTER — Other Ambulatory Visit: Payer: Self-pay

## 2020-07-05 ENCOUNTER — Encounter (HOSPITAL_COMMUNITY): Payer: Self-pay

## 2020-07-05 DIAGNOSIS — Z01812 Encounter for preprocedural laboratory examination: Secondary | ICD-10-CM | POA: Insufficient documentation

## 2020-07-05 HISTORY — DX: Nausea with vomiting, unspecified: R11.2

## 2020-07-05 HISTORY — DX: Other specified postprocedural states: Z98.890

## 2020-07-05 LAB — BASIC METABOLIC PANEL
Anion gap: 8 (ref 5–15)
BUN: 12 mg/dL (ref 6–20)
CO2: 23 mmol/L (ref 22–32)
Calcium: 9.5 mg/dL (ref 8.9–10.3)
Chloride: 108 mmol/L (ref 98–111)
Creatinine, Ser: 0.93 mg/dL (ref 0.44–1.00)
GFR, Estimated: >60 mL/min (ref >60)
Glucose, Bld: 103 mg/dL — ABNORMAL HIGH (ref 70–99)
Potassium: 4 mmol/L (ref 3.5–5.1)
Sodium: 139 mmol/L (ref 135–145)

## 2020-07-05 LAB — CBC
HCT: 41.7 % (ref 36.0–46.0)
Hemoglobin: 13.7 g/dL (ref 12.0–15.0)
MCH: 29.2 pg (ref 26.0–34.0)
MCHC: 32.9 g/dL (ref 30.0–36.0)
MCV: 88.9 fL (ref 80.0–100.0)
Platelets: 354 10*3/uL (ref 150–400)
RBC: 4.69 MIL/uL (ref 3.87–5.11)
RDW: 13.3 % (ref 11.5–15.5)
WBC: 7.7 10*3/uL (ref 4.0–10.5)
nRBC: 0 % (ref 0.0–0.2)

## 2020-07-05 MED ORDER — DEXTROSE 5 % IV SOLN: 900.0000 mg | INTRAVENOUS | Status: AC

## 2020-07-05 MED ORDER — GENTAMICIN SULFATE 40 MG/ML IJ SOLN: 5.0000 mg/kg | INTRAVENOUS | Status: AC

## 2020-07-05 NOTE — Progress Notes (Signed)
No orders in at time of preop appt.  REquested orders on 07/04/2020.  Pt came back to preop and stated office sent her over to get bowel prep instructions.  Called office of [facility] and spoke with Tresa Endo .  Stated no orders in epic.  Pt heard phone call.  Tresa Endo statred she would have MD place orders in epic. Informed Kelly at [facility] office that once orders placed I would call pt and havve her pick up additoinal instructions .  Pt voiced understanding.

## 2020-07-05 NOTE — Progress Notes (Signed)
DUE TO COVID-19 ONLY ONE VISITOR IS ALLOWED TO COME WITH YOU AND STAY IN THE WAITING ROOM ONLY DURING PRE OP AND PROCEDURE DAY OF SURGERY. THE 1 VISITOR  MAY VISIT WITH YOU AFTER SURGERY IN YOUR PRIVATE ROOM DURING VISITING HOURS ONLY!  YOU NEED TO HAVE A COVID 19 TEST ON_1/14/2022 ______ @_______ , THIS TEST MUST BE DONE BEFORE SURGERY,  COVID TESTING SITE [address] [place] , IT IS ON THE RIGHT GOING OUT [address] APPROXIMATELY  2 MINUTES PAST [employer] ON THE RIGHT. ONCE YOUR COVID TEST IS COMPLETED,  PLEASE BEGIN THE QUARANTINE INSTRUCTIONS AS OUTLINED IN YOUR HANDOUT.                MARGERET STACHNIK  07/05/2020   Your procedure is scheduled on: 07/12/2020    Report to [facility] Main  Entrance   Report to admitting at   0830 AM     Call this number if you have problems the morning of surgery [phone]    Remember: Do not eat food , candy gum or mints :After Midnight. You may have clear liquids from midnight until  0730am     CLEAR LIQUID DIET   Foods Allowed                                                                       Coffee and tea, regular and decaf                              Plain Jell-O any favor except red or purple                                            Fruit ices (not with fruit pulp)                                      Iced Popsicles                                     Carbonated beverages, regular and diet                                    Cranberry, grape and apple juices Sports drinks like Gatorade Lightly seasoned clear broth or consume(fat free) Sugar, honey syrup   _____________________________________________________________________    BRUSH YOUR TEETH MORNING OF SURGERY AND RINSE YOUR MOUTH OUT, NO CHEWING GUM CANDY OR MINTS.     Take these medicines the morning of surgery with A SIP OF WATER:   amlodipine  DO NOT TAKE ANY DIABETIC MEDICATIONS DAY OF YOUR SURGERY                                You may not have any metal on  your body including hair pins and              piercings  Do not wear jewelry, make-up, lotions, powders or perfumes, deodorant             Do not wear nail polish on your fingernails.  Do not shave  48 hours prior to surgery.              Men may shave face and neck.   Do not bring valuables to the hospital. [place].  Contacts, dentures or bridgework may not be worn into surgery.  Leave suitcase in the car. After surgery it may be brought to your room.     Patients discharged the day of surgery will not be allowed to drive home. IF YOU ARE HAVING SURGERY AND GOING HOME THE SAME DAY, YOU MUST HAVE AN ADULT TO DRIVE YOU HOME AND BE WITH YOU FOR 24 HOURS. YOU MAY GO HOME BY TAXI OR UBER OR ORTHERWISE, BUT AN ADULT MUST ACCOMPANY YOU HOME AND STAY WITH YOU FOR 24 HOURS.  Name and phone number of your driver:  Special Instructions: N/A              Please read over the following fact sheets you were given: _____________________________________________________________________  [facility] - Preparing for Surgery Before surgery, you can play an important role.  Because skin is not sterile, your skin needs to be as free of germs as possible.  You can reduce the number of germs on your skin by washing with CHG (chlorahexidine gluconate) soap before surgery.  CHG is an antiseptic cleaner which kills germs and bonds with the skin to continue killing germs even after washing. Please DO NOT use if you have an allergy to CHG or antibacterial soaps.  If your skin becomes reddened/irritated stop using the CHG and inform your nurse when you arrive at Short Stay. Do not shave (including legs and underarms) for at least 48 hours prior to the first CHG shower.  You may shave your face/neck. Please follow these instructions carefully:  1.  Shower with CHG Soap the night before surgery and the  morning of Surgery.  2.   If you choose to wash your hair, wash your hair first as usual with your  normal  shampoo.  3.  After you shampoo, rinse your hair and body thoroughly to remove the  shampoo.                           4.  Use CHG as you would any other liquid soap.  You can apply chg directly  to the skin and wash                       Gently with a scrungie or clean washcloth.  5.  Apply the CHG Soap to your body ONLY FROM THE NECK DOWN.   Do not use on face/ open                           Wound or open sores. Avoid contact with eyes, ears mouth and genitals (private parts).                       Wash face,  Genitals (  private parts) with your normal soap.             6.  Wash thoroughly, paying special attention to the area where your surgery  will be performed.  7.  Thoroughly rinse your body with warm water from the neck down.  8.  DO NOT shower/wash with your normal soap after using and rinsing off  the CHG Soap.                9.  Pat yourself dry with a clean towel.            10.  Wear clean pajamas.            11.  Place clean sheets on your bed the night of your first shower and do not  sleep with pets. Day of Surgery : Do not apply any lotions/deodorants the morning of surgery.  Please wear clean clothes to the hospital/surgery center.  FAILURE TO FOLLOW THESE INSTRUCTIONS MAY RESULT IN THE CANCELLATION OF YOUR SURGERY PATIENT SIGNATURE_________________________________  NURSE SIGNATURE__________________________________  ________________________________________________________________________

## 2020-07-06 ENCOUNTER — Encounter (HOSPITAL_COMMUNITY)
Admission: RE | Admit: 2020-07-06 | Discharge: 2020-07-06 | Disposition: A | Discharge status: Home / Self Care | Payer: BC Managed Care – PPO | Source: Ambulatory Visit | Attending: Surgery | Admitting: Surgery

## 2020-07-06 DIAGNOSIS — Z01812 Encounter for preprocedural laboratory examination: Secondary | ICD-10-CM | POA: Diagnosis present

## 2020-07-06 LAB — HEMOGLOBIN A1C
Hgb A1c MFr Bld: 5.1 % (ref 4.8–5.6)
Mean Plasma Glucose: 99.67 mg/dL

## 2020-07-06 NOTE — Progress Notes (Signed)
Pt came back in and hgba1c and cea obtained.  Ostomy nurse met with pt and pt instructed on clear liquid diet on day of bowel prep and pt given instructions on ensure drinks prior to surgery ( total of 3 drinks ) and reviewed instructions with pt andpt given a copy

## 2020-07-06 NOTE — Progress Notes (Addendum)
DRINK 2 PRESURGERY ENSURE DRINKS THE NIGHT BEFORE SURGERY AT  1000 PM AND 1 PRESURGERY DRINK THE DAY OF THE PROCEDURE 3 HOURS PRIOR TO SCHEDULED SURGERY. NO SOLIDS AFTER MIDNIGHT THE DAY PRIOR TO THE SURGERY. NOTHING BY MOUTH EXCEPT CLEAR LIQUIDS UNTIL THREE HOURS PRIOR TO SCHEDULED SURGERY. PLEASE FINISH PRESURGERY ENSURE DRINK PER SURGEON ORDER 3 HOURS PRIOR TO SCHEDULED SURGERY TIME WHICH NEEDS TO BE COMPLETED AT __0730am _______.

## 2020-07-06 NOTE — Consult Note (Incomplete)
WOC Nurse requested for preoperative stoma site marking  Discussed surgical procedure and stoma creation with patient.  Explained role of the WOC nurse team.  Provided the patient with educational booklet and provided samples of pouching options.  Answered patient questions.  Has friend that recently got an ostomy.  She is hoping she will not require an ostomy but is prepared if it is required.   Examined patient sitting, and standing in order to place the marking in the patient's visual field, away from any creases or abdominal contour issues and within the rectus muscle.  Patient has significant creasing above umbilicus.  She wears her pants directly across her umbilicus, but we agree that a lower marking will give her a flat pouching surface with less risk of a complex pouching situation.  Marked for colostomy in the LLQ  5 cm to the left of the umbilicus and 3 cm below the umbilicus.  Marked for ileostomy in the RLQ  5 cm to the right of the umbilicus and  4 cm below the umbilicus.   Patient's abdomen cleansed with CHG wipes at site markings, allowed to air dry prior to marking.Covered mark with thin film transparent dressing to preserve mark until date of surgery.   WOC Nurse team will follow up with patient after surgery for continue ostomy care and teaching.   Deborah Issaih Kaus MSN, RN, FNP-BC CWON Wound, Ostomy, Continence Nurse Pager [phone]  

## 2020-07-07 LAB — CEA: CEA: 0.9 ng/mL (ref 0.0–4.7)

## 2020-07-08 ENCOUNTER — Other Ambulatory Visit (HOSPITAL_COMMUNITY)
Admission: RE | Admit: 2020-07-08 | Discharge: 2020-07-08 | Disposition: A | Discharge status: Home / Self Care | Payer: BC Managed Care – PPO | Source: Ambulatory Visit | Attending: Surgery | Admitting: Surgery

## 2020-07-08 DIAGNOSIS — Z01812 Encounter for preprocedural laboratory examination: Secondary | ICD-10-CM | POA: Diagnosis not present

## 2020-07-08 DIAGNOSIS — Z20822 Contact with and (suspected) exposure to covid-19: Secondary | ICD-10-CM | POA: Diagnosis not present

## 2020-07-09 LAB — SARS CORONAVIRUS 2 (TAT 6-24 HRS): SARS Coronavirus 2: NEGATIVE

## 2020-07-11 MED ORDER — BUPIVACAINE LIPOSOME 1.3 % IJ SUSP
20.0000 mL | Freq: Once | INTRAMUSCULAR | Status: DC
  Filled 2020-07-11: qty 20

## 2020-07-12 LAB — TYPE AND SCREEN
ABO/RH(D): O POS
Antibody Screen: NEGATIVE

## 2020-08-03 NOTE — Patient Instructions (Addendum)
DUE TO COVID-19 ONLY ONE VISITOR IS ALLOWED TO COME WITH YOU AND STAY IN THE WAITING ROOM ONLY DURING PRE OP AND PROCEDURE DAY OF SURGERY. THE 1 VISITOR  MAY VISIT WITH YOU AFTER SURGERY IN YOUR PRIVATE ROOM DURING VISITING HOURS ONLY!  YOU NEED TO HAVE A COVID 19 TEST ON: 08/04/20 @ 10:15 AM , THIS TEST MUST BE DONE BEFORE SURGERY,  COVID TESTING SITE [address] [place] [address], IT IS ON THE RIGHT GOING OUT [address] APPROXIMATELY  2 MINUTES PAST [employer] ON THE RIGHT. ONCE YOUR COVID TEST IS COMPLETED,  PLEASE BEGIN THE QUARANTINE INSTRUCTIONS AS OUTLINED IN YOUR HANDOUT.                Deborah Chaney   Your procedure is scheduled on: 08/08/20   Report to [facility] Main  Entrance   Report to SHORT STAY at: 5:30 AM     Call this number if you have problems the morning of surgery [phone]    Remember:  DRINK 2 PRESURGERY ENSURE DRINKS THE NIGHT BEFORE SURGERY AT  1000 PM AND 1 PRESURGERY DRINK THE DAY OF THE PROCEDURE 3 HOURS PRIOR TO SCHEDULED SURGERY. NO SOLIDS AFTER MIDNIGHT THE DAY PRIOR TO THE SURGERY. NOTHING BY MOUTH EXCEPT CLEAR LIQUIDS UNTIL THREE HOURS PRIOR TO SCHEDULED SURGERY. PLEASE FINISH PRESURGERY ENSURE DRINK PER SURGEON ORDER 3 HOURS PRIOR TO SCHEDULED SURGERY TIME WHICH NEEDS TO BE COMPLETED AT : 4:30 AM.  CLEAR LIQUID DIET  Foods Allowed                                                                     Foods Excluded  Coffee and tea, regular and decaf                             liquids that you cannot  Plain Jell-O any favor except red or purple                                           see through such as: Fruit ices (not with fruit pulp)                                     milk, soups, orange juice  Iced Popsicles                                    All solid food Carbonated beverages, regular and diet                                    Cranberry, grape and apple juices Sports drinks like Gatorade Lightly  seasoned clear broth or consume(fat free) Sugar, honey syrup  Sample Menu Breakfast  Lunch                                     Supper Cranberry juice                    Beef broth                            Chicken broth Jell-O                                     Grape juice                           Apple juice Coffee or tea                        Jell-O                                      Popsicle                                                Coffee or tea                        Coffee or tea  _____________________________________________________________________  BRUSH YOUR TEETH MORNING OF SURGERY AND RINSE YOUR MOUTH OUT, NO CHEWING GUM CANDY OR MINTS.   Take these medicines the morning of surgery with A SIP OF WATER:florastor,amlodipine.                               You may not have any metal on your body including hair pins and              piercings  Do not wear jewelry, make-up, lotions, powders or perfumes, deodorant             Do not wear nail polish on your fingernails.  Do not shave  48 hours prior to surgery.    Do not bring valuables to the hospital. [facility] IS NOT             RESPONSIBLE   FOR VALUABLES.  Contacts, dentures or bridgework may not be worn into surgery.  Leave suitcase in the car. After surgery it may be brought to your room.    Patients discharged the day of surgery will not be allowed to drive home. IF YOU ARE HAVING SURGERY AND GOING HOME THE SAME DAY, YOU MUST HAVE AN ADULT TO DRIVE YOU HOME AND BE WITH YOU FOR 24 HOURS. YOU MAY GO HOME BY TAXI OR UBER OR ORTHERWISE, BUT AN ADULT MUST ACCOMPANY YOU HOME AND STAY WITH YOU FOR 24 HOURS.  Name and phone number of your driver:  Special Instructions: N/A              Please read over the following fact sheets you were given: _____________________________________________________________________          Deborah Chaney  Health - Preparing for Surgery Before surgery, you can play an  important role.  Because skin is not sterile, your skin needs to be as free of germs as possible.  You can reduce the number of germs on your skin by washing with CHG (chlorahexidine gluconate) soap before surgery.  CHG is an antiseptic cleaner which kills germs and bonds with the skin to continue killing germs even after washing. Please DO NOT use if you have an allergy to CHG or antibacterial soaps.  If your skin becomes reddened/irritated stop using the CHG and inform your nurse when you arrive at Short Stay. Do not shave (including legs and underarms) for at least 48 hours prior to the first CHG shower.  You may shave your face/neck. Please follow these instructions carefully:  1.  Shower with CHG Soap the night before surgery and the  morning of Surgery.  2.  If you choose to wash your hair, wash your hair first as usual with your  normal  shampoo.  3.  After you shampoo, rinse your hair and body thoroughly to remove the  shampoo.                           4.  Use CHG as you would any other liquid soap.  You can apply chg directly  to the skin and wash                       Gently with a scrungie or clean washcloth.  5.  Apply the CHG Soap to your body ONLY FROM THE NECK DOWN.   Do not use on face/ open                           Wound or open sores. Avoid contact with eyes, ears mouth and genitals (private parts).                       Wash face,  Genitals (private parts) with your normal soap.             6.  Wash thoroughly, paying special attention to the area where your surgery  will be performed.  7.  Thoroughly rinse your body with warm water from the neck down.  8.  DO NOT shower/wash with your normal soap after using and rinsing off  the CHG Soap.                9.  Pat yourself dry with a clean towel.            10.  Wear clean pajamas.            11.  Place clean sheets on your bed the night of your first shower and do not  sleep with pets. Day of Surgery : Do not apply any  lotions/deodorants the morning of surgery.  Please wear clean clothes to the hospital/surgery center.  FAILURE TO FOLLOW THESE INSTRUCTIONS MAY RESULT IN THE CANCELLATION OF YOUR SURGERY PATIENT SIGNATURE_________________________________  NURSE SIGNATURE__________________________________  ________________________________________________________________________   Rogelia Mire  An incentive spirometer is a tool that can help keep your lungs clear and active. This tool measures how well you are filling your lungs with each breath. Taking long deep breaths may help reverse or decrease the chance of developing breathing (pulmonary) problems (especially infection) following:  A long period of time  when you are unable to move or be active. BEFORE THE PROCEDURE   If the spirometer includes an indicator to show your best effort, your nurse or respiratory therapist will set it to a desired goal.  If possible, sit up straight or lean slightly forward. Try not to slouch.  Hold the incentive spirometer in an upright position. INSTRUCTIONS FOR USE  1. Sit on the edge of your bed if possible, or sit up as far as you can in bed or on a chair. 2. Hold the incentive spirometer in an upright position. 3. Breathe out normally. 4. Place the mouthpiece in your mouth and seal your lips tightly around it. 5. Breathe in slowly and as deeply as possible, raising the piston or the ball toward the top of the column. 6. Hold your breath for 3-5 seconds or for as long as possible. Allow the piston or ball to fall to the bottom of the column. 7. Remove the mouthpiece from your mouth and breathe out normally. 8. Rest for a few seconds and repeat Steps 1 through 7 at least 10 times every 1-2 hours when you are awake. Take your time and take a few normal breaths between deep breaths. 9. The spirometer may include an indicator to show your best effort. Use the indicator as a goal to work toward during each  repetition. 10. After each set of 10 deep breaths, practice coughing to be sure your lungs are clear. If you have an incision (the cut made at the time of surgery), support your incision when coughing by placing a pillow or rolled up towels firmly against it. Once you are able to get out of bed, walk around indoors and cough well. You may stop using the incentive spirometer when instructed by your caregiver.  RISKS AND COMPLICATIONS  Take your time so you do not get dizzy or light-headed.  If you are in pain, you may need to take or ask for pain medication before doing incentive spirometry. It is harder to take a deep breath if you are having pain. AFTER USE  Rest and breathe slowly and easily.  It can be helpful to keep track of a log of your progress. Your caregiver can provide you with a simple table to help with this. If you are using the spirometer at home, follow these instructions: SEEK MEDICAL CARE IF:   You are having difficultly using the spirometer.  You have trouble using the spirometer as often as instructed.  Your pain medication is not giving enough relief while using the spirometer.  You develop fever of 100.5 F (38.1 C) or higher. SEEK IMMEDIATE MEDICAL CARE IF:   You cough up bloody sputum that had not been present before.  You develop fever of 102 F (38.9 C) or greater.  You develop worsening pain at or near the incision site. MAKE SURE YOU:   Understand these instructions.  Will watch your condition.  Will get help right away if you are not doing well or get worse. Document Released: 10/22/2006 Document Revised: 09/03/2011 Document Reviewed: 12/23/2006 [facility] Patient Information 2014 [place], [place].   ________________________________________________________________________

## 2020-08-04 ENCOUNTER — Encounter (HOSPITAL_COMMUNITY)
Admission: RE | Admit: 2020-08-04 | Discharge: 2020-08-04 | Disposition: A | Discharge status: Home / Self Care | Payer: BC Managed Care – PPO | Source: Ambulatory Visit | Attending: Surgery | Admitting: Surgery

## 2020-08-04 ENCOUNTER — Encounter (HOSPITAL_COMMUNITY): Payer: Self-pay

## 2020-08-04 ENCOUNTER — Other Ambulatory Visit (HOSPITAL_COMMUNITY)
Admission: RE | Admit: 2020-08-04 | Discharge: 2020-08-04 | Disposition: A | Discharge status: Home / Self Care | Payer: BC Managed Care – PPO | Source: Ambulatory Visit | Attending: Surgery | Admitting: Surgery

## 2020-08-04 ENCOUNTER — Other Ambulatory Visit: Payer: Self-pay

## 2020-08-04 DIAGNOSIS — Z01812 Encounter for preprocedural laboratory examination: Secondary | ICD-10-CM | POA: Diagnosis present

## 2020-08-04 DIAGNOSIS — Z20822 Contact with and (suspected) exposure to covid-19: Secondary | ICD-10-CM | POA: Insufficient documentation

## 2020-08-04 HISTORY — DX: Sickle-cell disease without crisis: D57.1

## 2020-08-04 LAB — BASIC METABOLIC PANEL
Anion gap: 10 (ref 5–15)
BUN: 15 mg/dL (ref 6–20)
CO2: 26 mmol/L (ref 22–32)
Calcium: 9.4 mg/dL (ref 8.9–10.3)
Chloride: 105 mmol/L (ref 98–111)
Creatinine, Ser: 0.84 mg/dL (ref 0.44–1.00)
GFR, Estimated: >60 mL/min (ref >60)
Glucose, Bld: 104 mg/dL — ABNORMAL HIGH (ref 70–99)
Potassium: 4.3 mmol/L (ref 3.5–5.1)
Sodium: 141 mmol/L (ref 135–145)

## 2020-08-04 LAB — CBC
HCT: 41.4 % (ref 36.0–46.0)
Hemoglobin: 13.5 g/dL (ref 12.0–15.0)
MCH: 29.3 pg (ref 26.0–34.0)
MCHC: 32.6 g/dL (ref 30.0–36.0)
MCV: 90 fL (ref 80.0–100.0)
Platelets: 317 10*3/uL (ref 150–400)
RBC: 4.6 MIL/uL (ref 3.87–5.11)
RDW: 13.3 % (ref 11.5–15.5)
WBC: 8.4 10*3/uL (ref 4.0–10.5)
nRBC: 0 % (ref 0.0–0.2)

## 2020-08-04 LAB — SARS CORONAVIRUS 2 (TAT 6-24 HRS): SARS Coronavirus 2: NEGATIVE

## 2020-08-04 NOTE — Consult Note (Signed)
WOC Nurse requested for preoperative stoma site marking Patient was marked previously but surgery was rescheduled and need to re-assess and mark again for surgery Monday.  Discussed surgical procedure and stoma creation with patient.  Explained role of the WOC nurse team.  Provided the patient with educational booklet and provided samples of pouching options.  Answered patient questions.   Examined patient sitting, and standing in order to place the marking in the patient's visual field, away from any creases or abdominal contour issues and within the rectus muscle.  Patient has large creasing at waist line, she is marked higher   Marked for colostomy in the LLQ  9 cm to the left of the umbilicus and 4 cm above the umbilicus.  Marked for ileostomy in the RLQ  9 cm to the right of the umbilicus and  4 cm above the umbilicus.   Patient's abdomen cleansed with CHG wipes at site markings, allowed to air dry prior to marking.Covered mark with thin film transparent dressing to preserve mark until date of surgery.   WOC Nurse team will follow up with patient after surgery for continue ostomy care and teaching.  Maple Hudson MSN, RN, FNP-BC CWON Wound, Ostomy, Continence Nurse Pager [phone]

## 2020-08-04 NOTE — Progress Notes (Addendum)
COVID Vaccine Completed: Yes Date COVID Vaccine completed:05/17/20 [place] COVID vaccine manufacturer: Moderna     PCP - Dr. Deatra James Cardiologist -   Chest x-ray -  EKG - 06/21/20 Stress Test -  ECHO -  Cardiac Cath -  Pacemaker/ICD device last checked:  Sleep Study -  CPAP -   Fasting Blood Sugar -  Checks Blood Sugar _____ times a day  Blood Thinner Instructions: Aspirin Instructions: Last Dose:  Anesthesia review:   Patient denies shortness of breath, fever, cough and chest pain at PAT appointment   Patient verbalized understanding of instructions that were given to them at the PAT appointment. Patient was also instructed that they will need to review over the PAT instructions again at home before surgery.

## 2020-08-08 ENCOUNTER — Encounter (HOSPITAL_COMMUNITY): Payer: Self-pay | Admitting: Surgery

## 2020-08-08 ENCOUNTER — Inpatient Hospital Stay (HOSPITAL_COMMUNITY)
Admission: RE | Admit: 2020-08-08 | Discharge: 2020-08-10 | DRG: 330 | Disposition: A | Discharge status: Home / Self Care | Payer: BC Managed Care – PPO | Attending: Surgery | Admitting: Surgery

## 2020-08-08 ENCOUNTER — Other Ambulatory Visit: Payer: Self-pay

## 2020-08-08 ENCOUNTER — Inpatient Hospital Stay (HOSPITAL_COMMUNITY): Payer: BC Managed Care – PPO | Admitting: Certified Registered Nurse Anesthetist

## 2020-08-08 ENCOUNTER — Encounter (HOSPITAL_COMMUNITY)
Admission: RE | Disposition: A | Discharge status: Home / Self Care | Payer: Self-pay | Source: Home / Self Care | Attending: Surgery

## 2020-08-08 DIAGNOSIS — Z7982 Long term (current) use of aspirin: Secondary | ICD-10-CM | POA: Diagnosis not present

## 2020-08-08 DIAGNOSIS — Z880 Allergy status to penicillin: Secondary | ICD-10-CM

## 2020-08-08 DIAGNOSIS — K5780 Diverticulitis of intestine, part unspecified, with perforation and abscess without bleeding: Secondary | ICD-10-CM | POA: Diagnosis present

## 2020-08-08 DIAGNOSIS — D571 Sickle-cell disease without crisis: Secondary | ICD-10-CM | POA: Diagnosis present

## 2020-08-08 DIAGNOSIS — Z6841 Body Mass Index (BMI) 40.0 and over, adult: Secondary | ICD-10-CM

## 2020-08-08 DIAGNOSIS — Z79899 Other long term (current) drug therapy: Secondary | ICD-10-CM

## 2020-08-08 DIAGNOSIS — N739 Female pelvic inflammatory disease, unspecified: Secondary | ICD-10-CM | POA: Diagnosis present

## 2020-08-08 DIAGNOSIS — Z9851 Tubal ligation status: Secondary | ICD-10-CM

## 2020-08-08 DIAGNOSIS — I10 Essential (primary) hypertension: Secondary | ICD-10-CM | POA: Diagnosis present

## 2020-08-08 DIAGNOSIS — K5732 Diverticulitis of large intestine without perforation or abscess without bleeding: Secondary | ICD-10-CM | POA: Diagnosis present

## 2020-08-08 DIAGNOSIS — Z91013 Allergy to seafood: Secondary | ICD-10-CM | POA: Diagnosis not present

## 2020-08-08 DIAGNOSIS — Z90710 Acquired absence of both cervix and uterus: Secondary | ICD-10-CM

## 2020-08-08 HISTORY — PX: COLON RESECTION SIGMOID: SHX6737

## 2020-08-08 LAB — CBC
HCT: 41.2 % (ref 36.0–46.0)
Hemoglobin: 13.3 g/dL (ref 12.0–15.0)
MCH: 29.8 pg (ref 26.0–34.0)
MCHC: 32.3 g/dL (ref 30.0–36.0)
MCV: 92.2 fL (ref 80.0–100.0)
Platelets: 341 10*3/uL (ref 150–400)
RBC: 4.47 MIL/uL (ref 3.87–5.11)
RDW: 13.3 % (ref 11.5–15.5)
WBC: 31.6 10*3/uL — ABNORMAL HIGH (ref 4.0–10.5)
nRBC: 0 % (ref 0.0–0.2)

## 2020-08-08 LAB — CREATININE, SERUM
Creatinine, Ser: 1.05 mg/dL — ABNORMAL HIGH (ref 0.44–1.00)
GFR, Estimated: >60 mL/min (ref >60)

## 2020-08-08 SURGERY — COLECTOMY, SIGMOID, OPEN
Anesthesia: General | Site: Abdomen

## 2020-08-08 MED ORDER — SCOPOLAMINE 1 MG/3DAYS TD PT72
MEDICATED_PATCH | TRANSDERMAL | Status: AC
  Filled 2020-08-08: qty 1

## 2020-08-08 MED ORDER — PHENYLEPHRINE HCL-NACL 10-0.9 MG/250ML-% IV SOLN
INTRAVENOUS | Status: DC | PRN
  Administered 2020-08-08: 30 ug/min via INTRAVENOUS

## 2020-08-08 MED ORDER — METRONIDAZOLE IN NACL 5-0.79 MG/ML-% IV SOLN
500.0000 mg | Freq: Once | INTRAVENOUS | Status: AC
  Administered 2020-08-08: 500 mg via INTRAVENOUS
  Filled 2020-08-08: qty 100

## 2020-08-08 MED ORDER — ENSURE SURGERY PO LIQD
237.0000 mL | Freq: Two times a day (BID) | ORAL | Status: DC
  Administered 2020-08-08 – 2020-08-09 (×2): 237 mL via ORAL

## 2020-08-08 MED ORDER — 0.9 % SODIUM CHLORIDE (POUR BTL) OPTIME
TOPICAL | Status: DC | PRN
  Administered 2020-08-08: 3000 mL

## 2020-08-08 MED ORDER — ONDANSETRON HCL 4 MG/2ML IJ SOLN
INTRAMUSCULAR | Status: AC
  Filled 2020-08-08: qty 2

## 2020-08-08 MED ORDER — ACETAMINOPHEN 325 MG PO TABS
650.0000 mg | ORAL_TABLET | Freq: Four times a day (QID) | ORAL | Status: DC
  Administered 2020-08-08 – 2020-08-10 (×8): 650 mg via ORAL
  Filled 2020-08-08 (×8): qty 2

## 2020-08-08 MED ORDER — METRONIDAZOLE IN NACL 5-0.79 MG/ML-% IV SOLN
INTRAVENOUS | Status: AC
  Filled 2020-08-08: qty 100

## 2020-08-08 MED ORDER — CIPROFLOXACIN IN D5W 400 MG/200ML IV SOLN
400.0000 mg | Freq: Once | INTRAVENOUS | Status: AC
  Administered 2020-08-08: 400 mg via INTRAVENOUS

## 2020-08-08 MED ORDER — FENTANYL CITRATE (PF) 100 MCG/2ML IJ SOLN
INTRAMUSCULAR | Status: DC | PRN
  Administered 2020-08-08 (×7): 50 ug via INTRAVENOUS

## 2020-08-08 MED ORDER — GABAPENTIN 300 MG PO CAPS
300.0000 mg | ORAL_CAPSULE | ORAL | Status: AC
  Administered 2020-08-08: 300 mg via ORAL
  Filled 2020-08-08: qty 1

## 2020-08-08 MED ORDER — ACETAMINOPHEN 500 MG PO TABS
1000.0000 mg | ORAL_TABLET | ORAL | Status: AC
  Administered 2020-08-08: 1000 mg via ORAL
  Filled 2020-08-08: qty 2

## 2020-08-08 MED ORDER — GABAPENTIN 300 MG PO CAPS
300.0000 mg | ORAL_CAPSULE | Freq: Three times a day (TID) | ORAL | Status: DC
  Administered 2020-08-08 – 2020-08-10 (×6): 300 mg via ORAL
  Filled 2020-08-08 (×6): qty 1

## 2020-08-08 MED ORDER — CHLORHEXIDINE GLUCONATE CLOTH 2 % EX PADS: 6.0000 | MEDICATED_PAD | Freq: Once | CUTANEOUS | Status: DC

## 2020-08-08 MED ORDER — NEOMYCIN SULFATE 500 MG PO TABS: 1000.0000 mg | ORAL_TABLET | ORAL | Status: DC

## 2020-08-08 MED ORDER — ORAL CARE MOUTH RINSE: 15.0000 mL | Freq: Once | OROMUCOSAL | Status: AC

## 2020-08-08 MED ORDER — KETAMINE HCL 10 MG/ML IJ SOLN
INTRAMUSCULAR | Status: DC | PRN
  Administered 2020-08-08: 30 mg via INTRAVENOUS

## 2020-08-08 MED ORDER — ENSURE PRE-SURGERY PO LIQD
296.0000 mL | Freq: Once | ORAL | Status: DC
  Filled 2020-08-08: qty 296

## 2020-08-08 MED ORDER — DEXAMETHASONE SODIUM PHOSPHATE 10 MG/ML IJ SOLN
INTRAMUSCULAR | Status: AC
  Filled 2020-08-08: qty 1

## 2020-08-08 MED ORDER — ONDANSETRON HCL 4 MG PO TABS: 4.0000 mg | ORAL_TABLET | Freq: Four times a day (QID) | ORAL | Status: DC | PRN

## 2020-08-08 MED ORDER — BUPIVACAINE LIPOSOME 1.3 % IJ SUSP
20.0000 mL | Freq: Once | INTRAMUSCULAR | Status: AC
  Administered 2020-08-08: 20 mL
  Filled 2020-08-08: qty 20

## 2020-08-08 MED ORDER — DEXAMETHASONE SODIUM PHOSPHATE 10 MG/ML IJ SOLN
INTRAMUSCULAR | Status: DC | PRN
  Administered 2020-08-08: 10 mg via INTRAVENOUS

## 2020-08-08 MED ORDER — ROCURONIUM BROMIDE 10 MG/ML (PF) SYRINGE
PREFILLED_SYRINGE | INTRAVENOUS | Status: AC
  Filled 2020-08-08: qty 10

## 2020-08-08 MED ORDER — ENSURE PRE-SURGERY PO LIQD
592.0000 mL | Freq: Once | ORAL | Status: DC
  Filled 2020-08-08: qty 592

## 2020-08-08 MED ORDER — METRONIDAZOLE 500 MG PO TABS: 1000.0000 mg | ORAL_TABLET | ORAL | Status: DC

## 2020-08-08 MED ORDER — ASPIRIN EC 81 MG PO TBEC
81.0000 mg | DELAYED_RELEASE_TABLET | Freq: Every day | ORAL | Status: DC
Start: 2020-08-09 — End: 2020-08-10
  Administered 2020-08-09 – 2020-08-10 (×2): 81 mg via ORAL
  Filled 2020-08-08 (×2): qty 1

## 2020-08-08 MED ORDER — ALBUMIN HUMAN 5 % IV SOLN: INTRAVENOUS | Status: DC | PRN

## 2020-08-08 MED ORDER — FENTANYL CITRATE (PF) 100 MCG/2ML IJ SOLN
INTRAMUSCULAR | Status: AC
  Filled 2020-08-08: qty 2

## 2020-08-08 MED ORDER — ONDANSETRON HCL 4 MG/2ML IJ SOLN
INTRAMUSCULAR | Status: DC | PRN
  Administered 2020-08-08: 4 mg via INTRAVENOUS

## 2020-08-08 MED ORDER — PHENYLEPHRINE HCL (PRESSORS) 10 MG/ML IV SOLN
INTRAVENOUS | Status: AC
  Filled 2020-08-08: qty 1

## 2020-08-08 MED ORDER — MIDAZOLAM HCL 5 MG/5ML IJ SOLN
INTRAMUSCULAR | Status: DC | PRN
  Administered 2020-08-08 (×2): 1 mg via INTRAVENOUS

## 2020-08-08 MED ORDER — OXYCODONE HCL 5 MG PO TABS
5.0000 mg | ORAL_TABLET | ORAL | Status: DC | PRN
  Administered 2020-08-09: 5 mg via ORAL
  Filled 2020-08-08: qty 1

## 2020-08-08 MED ORDER — ALVIMOPAN 12 MG PO CAPS
12.0000 mg | ORAL_CAPSULE | Freq: Two times a day (BID) | ORAL | Status: DC
  Administered 2020-08-09: 12 mg via ORAL
  Filled 2020-08-08: qty 1

## 2020-08-08 MED ORDER — ROCURONIUM BROMIDE 100 MG/10ML IV SOLN
INTRAVENOUS | Status: DC | PRN
  Administered 2020-08-08: 70 mg via INTRAVENOUS
  Administered 2020-08-08: 30 mg via INTRAVENOUS

## 2020-08-08 MED ORDER — PROPOFOL 10 MG/ML IV BOLUS
INTRAVENOUS | Status: AC
  Filled 2020-08-08: qty 20

## 2020-08-08 MED ORDER — SCOPOLAMINE 1 MG/3DAYS TD PT72
MEDICATED_PATCH | TRANSDERMAL | Status: DC | PRN
  Administered 2020-08-08: 1 via TRANSDERMAL

## 2020-08-08 MED ORDER — LACTATED RINGERS IV SOLN: INTRAVENOUS | Status: DC

## 2020-08-08 MED ORDER — FENTANYL CITRATE (PF) 250 MCG/5ML IJ SOLN
INTRAMUSCULAR | Status: AC
  Filled 2020-08-08: qty 5

## 2020-08-08 MED ORDER — HYDROMORPHONE HCL 1 MG/ML IJ SOLN: 0.5000 mg | INTRAMUSCULAR | Status: DC | PRN

## 2020-08-08 MED ORDER — CELECOXIB 200 MG PO CAPS
200.0000 mg | ORAL_CAPSULE | ORAL | Status: AC
  Administered 2020-08-08: 200 mg via ORAL
  Filled 2020-08-08: qty 1

## 2020-08-08 MED ORDER — LIDOCAINE HCL (PF) 2 % IJ SOLN
INTRAMUSCULAR | Status: DC | PRN
  Administered 2020-08-08: 1.5 mg/kg/h via INTRADERMAL

## 2020-08-08 MED ORDER — PROPOFOL 10 MG/ML IV BOLUS
INTRAVENOUS | Status: DC | PRN
  Administered 2020-08-08: 20 mg via INTRAVENOUS
  Administered 2020-08-08: 150 mg via INTRAVENOUS

## 2020-08-08 MED ORDER — CIPROFLOXACIN IN D5W 400 MG/200ML IV SOLN
INTRAVENOUS | Status: AC
  Filled 2020-08-08: qty 200

## 2020-08-08 MED ORDER — ALVIMOPAN 12 MG PO CAPS
12.0000 mg | ORAL_CAPSULE | ORAL | Status: AC
  Administered 2020-08-08: 12 mg via ORAL
  Filled 2020-08-08: qty 1

## 2020-08-08 MED ORDER — AMLODIPINE BESYLATE 10 MG PO TABS
10.0000 mg | ORAL_TABLET | Freq: Every day | ORAL | Status: DC
Start: 2020-08-09 — End: 2020-08-10
  Administered 2020-08-09 – 2020-08-10 (×2): 10 mg via ORAL
  Filled 2020-08-08 (×2): qty 1

## 2020-08-08 MED ORDER — MELATONIN 5 MG PO TABS: 5.0000 mg | ORAL_TABLET | Freq: Every evening | ORAL | Status: DC | PRN

## 2020-08-08 MED ORDER — ENOXAPARIN SODIUM 40 MG/0.4ML ~~LOC~~ SOLN
40.0000 mg | SUBCUTANEOUS | Status: DC
  Administered 2020-08-09 – 2020-08-10 (×2): 40 mg via SUBCUTANEOUS
  Filled 2020-08-08 (×2): qty 0.4

## 2020-08-08 MED ORDER — LIDOCAINE HCL (CARDIAC) PF 100 MG/5ML IV SOSY
PREFILLED_SYRINGE | INTRAVENOUS | Status: DC | PRN
  Administered 2020-08-08: 60 mg via INTRAVENOUS

## 2020-08-08 MED ORDER — POLYETHYLENE GLYCOL 3350 17 GM/SCOOP PO POWD: 1.0000 | Freq: Once | ORAL | Status: DC

## 2020-08-08 MED ORDER — LIDOCAINE HCL (PF) 2 % IJ SOLN
INTRAMUSCULAR | Status: AC
  Filled 2020-08-08: qty 5

## 2020-08-08 MED ORDER — MIDAZOLAM HCL 2 MG/2ML IJ SOLN
INTRAMUSCULAR | Status: AC
  Filled 2020-08-08: qty 2

## 2020-08-08 MED ORDER — ONDANSETRON HCL 4 MG/2ML IJ SOLN
4.0000 mg | Freq: Four times a day (QID) | INTRAMUSCULAR | Status: DC | PRN
  Administered 2020-08-08: 4 mg via INTRAVENOUS
  Filled 2020-08-08: qty 2

## 2020-08-08 MED ORDER — OXYCODONE HCL 5 MG PO TABS: 10.0000 mg | ORAL_TABLET | ORAL | Status: DC | PRN

## 2020-08-08 MED ORDER — ENOXAPARIN SODIUM 40 MG/0.4ML ~~LOC~~ SOLN
40.0000 mg | Freq: Once | SUBCUTANEOUS | Status: AC
  Administered 2020-08-08: 40 mg via SUBCUTANEOUS
  Filled 2020-08-08: qty 0.4

## 2020-08-08 MED ORDER — CHLORHEXIDINE GLUCONATE 0.12 % MT SOLN
15.0000 mL | Freq: Once | OROMUCOSAL | Status: AC
  Administered 2020-08-08: 15 mL via OROMUCOSAL

## 2020-08-08 MED ORDER — ALBUMIN HUMAN 5 % IV SOLN
INTRAVENOUS | Status: AC
  Filled 2020-08-08: qty 250

## 2020-08-08 MED ORDER — SUGAMMADEX SODIUM 200 MG/2ML IV SOLN
INTRAVENOUS | Status: DC | PRN
  Administered 2020-08-08: 200 mg via INTRAVENOUS

## 2020-08-08 MED ORDER — KETAMINE HCL 10 MG/ML IJ SOLN
INTRAMUSCULAR | Status: AC
  Filled 2020-08-08: qty 1

## 2020-08-08 SURGICAL SUPPLY — 55 items
ADH SKN CLS APL DERMABOND .7 (GAUZE/BANDAGES/DRESSINGS) ×1
APL PRP STRL LF DISP 70% ISPRP (MISCELLANEOUS) ×1
APPLIER CLIP 5 13 M/L LIGAMAX5 (MISCELLANEOUS)
APPLIER CLIP ROT 10 11.4 M/L (STAPLE)
APR CLP MED LRG 11.4X10 (STAPLE)
APR CLP MED LRG 5 ANG JAW (MISCELLANEOUS)
BLADE EXTENDED COATED 6.5IN (ELECTRODE) ×1 IMPLANT
CHLORAPREP W/TINT 26 (MISCELLANEOUS) ×2 IMPLANT
CLIP APPLIE 5 13 M/L LIGAMAX5 (MISCELLANEOUS) IMPLANT
CLIP APPLIE ROT 10 11.4 M/L (STAPLE) IMPLANT
COUNTER NEEDLE 20 DBL MAG RED (NEEDLE) ×2 IMPLANT
COVER MAYO STAND STRL (DRAPES) ×6 IMPLANT
COVER SURGICAL LIGHT HANDLE (MISCELLANEOUS) ×2 IMPLANT
COVER WAND RF STERILE (DRAPES) IMPLANT
DECANTER SPIKE VIAL GLASS SM (MISCELLANEOUS) ×2 IMPLANT
DERMABOND ADVANCED (GAUZE/BANDAGES/DRESSINGS) ×1
DERMABOND ADVANCED .7 DNX12 (GAUZE/BANDAGES/DRESSINGS) IMPLANT
DRAIN PENROSE 18X1/4 LTX STRL (DRAIN) ×1 IMPLANT
DRAPE LAPAROSCOPIC ABDOMINAL (DRAPES) ×2 IMPLANT
DRSG OPSITE POSTOP 4X10 (GAUZE/BANDAGES/DRESSINGS) IMPLANT
DRSG OPSITE POSTOP 4X6 (GAUZE/BANDAGES/DRESSINGS) IMPLANT
DRSG OPSITE POSTOP 4X8 (GAUZE/BANDAGES/DRESSINGS) IMPLANT
ELECT REM PT RETURN 15FT ADLT (MISCELLANEOUS) ×2 IMPLANT
GAUZE SPONGE 4X4 12PLY STRL (GAUZE/BANDAGES/DRESSINGS) ×1 IMPLANT
GLOVE BIOGEL M 8.0 STRL (GLOVE) ×4 IMPLANT
GOWN STRL REUS W/TWL XL LVL3 (GOWN DISPOSABLE) ×12 IMPLANT
KIT TURNOVER KIT A (KITS) ×2 IMPLANT
LEGGING LITHOTOMY PAIR STRL (DRAPES) ×2 IMPLANT
LIGASURE IMPACT 36 18CM CVD LR (INSTRUMENTS) ×1 IMPLANT
PACK COLON (CUSTOM PROCEDURE TRAY) ×2 IMPLANT
PAD POSITIONING PINK XL (MISCELLANEOUS) ×2 IMPLANT
PENCIL SMOKE EVACUATOR (MISCELLANEOUS) IMPLANT
PROTECTOR NERVE ULNAR (MISCELLANEOUS) ×4 IMPLANT
SPONGE LAP 18X18 RF (DISPOSABLE) ×2 IMPLANT
STAPLER CUT RELOAD BLUE (STAPLE) ×2 IMPLANT
STAPLER ECHELON POWER CIR 29 (STAPLE) ×1 IMPLANT
STAPLER VISISTAT 35W (STAPLE) ×2 IMPLANT
SUT CHROMIC 3 0 SH 27 (SUTURE) IMPLANT
SUT MNCRL AB 4-0 PS2 18 (SUTURE) ×1 IMPLANT
SUT PDS AB 1 CTX 36 (SUTURE) IMPLANT
SUT PDS AB 1 TP1 96 (SUTURE) IMPLANT
SUT PDS AB 2-0 CT2 27 (SUTURE) ×2 IMPLANT
SUT PDS AB 4-0 SH 27 (SUTURE) IMPLANT
SUT PROLENE 2 0 KS (SUTURE) IMPLANT
SUT SILK 2 0 (SUTURE) ×2
SUT SILK 2 0 SH CR/8 (SUTURE) ×2 IMPLANT
SUT SILK 2-0 18XBRD TIE 12 (SUTURE) ×1 IMPLANT
SUT SILK 3 0 (SUTURE) ×2
SUT SILK 3 0 SH CR/8 (SUTURE) ×2 IMPLANT
SUT SILK 3-0 18XBRD TIE 12 (SUTURE) ×1 IMPLANT
SUT VIC AB 3-0 SH 18 (SUTURE) ×1 IMPLANT
SUT VIC AB 4-0 SH 18 (SUTURE) ×2 IMPLANT
TOWEL OR NON WOVEN STRL DISP B (DISPOSABLE) ×2 IMPLANT
TRAY FOLEY MTR SLVR 16FR STAT (SET/KITS/TRAYS/PACK) IMPLANT
TUBING CONNECTING 10 (TUBING) IMPLANT

## 2020-08-08 NOTE — Op Note (Signed)
Patient: Deborah Chaney (1966-01-28, [id number])  Date of Surgery: 08/08/2020   Preoperative Diagnosis: DIVERTICULITIS   Postoperative Diagnosis: DIVERTICULITIS   Surgical Procedure:  OPEN SIGMOID COLECTOMY:   Mobilization of the splenic flexture  Operative Team Members:  Surgeon(s) and Role:    * Kaelem Brach, Hyman Hopes, MD - Primary    * Violeta Gelinas, MD - Assisting   Anesthesiologist: Eilene Ghazi, MD CRNA: Garth Bigness, CRNA; Florene Route, CRNA   Anesthesia: General   Fluids:  Total I/O In: 2250 [I.V.:2000; IV Piggyback:250] Out: 250 [Urine:50; Blood:200]  Complications: None  Drains:  Penrose drain in the subcutaneous space   Specimen:  ID Type Source Tests Collected by Time Destination  1 : Sigmoid colon suture marks distal Tissue PATH GI Other SURGICAL PATHOLOGY Seraphine Gudiel, Hyman Hopes, MD 08/08/2020 [phone]   2 : Proximal margin Tissue PATH GI Other SURGICAL PATHOLOGY Lawerance Matsuo, Hyman Hopes, MD 08/08/2020 0857   3 : Final distal margin Tissue PATH GI Other SURGICAL PATHOLOGY Lexander Tremblay, Hyman Hopes, MD 08/08/2020 0945      Disposition:  PACU - hemodynamically stable.  Plan of Care: Admit to inpatient    Indications for Procedure: Deborah Chaney is a 55 y.o. female who presented with diverticulitis complicated by pelvic abscess.  Despite drainage and antibiotics, the diverticulitis continually recurred as soon as she completed antibiotics.  Due to her case of smoldering diverticulitis, the recommendation was made to continue a course of antibiotics up until we could proceed with elective open sigmoid colectomy.  The procedure itself, as well as its risks, benefits and alternatives were discussed with the patient in detail who granted consent to proceed.  Findings: Inflamed sigmoid colon  Description of Procedure:    Informed consent was confirmed.  The patient underwent general anaesthesia without difficulty.  The patient was positioned in lithotomy  positioning using the stirrups.  VTE prevention in place included lovenox and SCDs.  Antibiotics were given (cipro and flagyl) prior to incision.  The patient's abdomen was clipped, prepped, & draped in a sterile fashion.  Surgical timeout confirmed our plan.  A lower midline laparotomy incision was made.  We entered the abdomen without any trauma to the underlying viscera.  There were no adhesions or other abnormalities within the abdomen.  The liver was palpated and there was no obvious abnormalities.  The patient was positioned in reverse Trendelenburg.  A bookwalter retractor was positioned to retract the small intestine superiorly and hold the abdominal wall open, exposing the sigmoid colon.   I mobilized the sigmoid colon off of the pelvic sidewall. I medialized the sigmoid colon, mobilizing it off the retroperitoneum.  The left ureter was identified where it crossed the iliac vessels and was protected through the remainder of the case. I continued distally until I reached the rectum, at the level of the sacral promontory where the tinea splayed. A window was created in the mesorectum and a blue load of the contour stapler was used to divide the proximal rectum.  I then divided the mesentery of the sigmoid colon using the Ligasure bipolar energy device, working proximally.  Proximal to the diseased section of sigmoid colon, a window was created in the mesocolon and then the proximal sigmoid colon was divided using the contour stapler with a blue load.  I then completed the division of the mesocolon.  As this was not a cancer operation, a formal high vascular ligation was not completed.  The sigmoid colon specimen was marked with a  suture marking the distal end and was passed off the field.  With the specimen passed off the field, I inspected for hemostasis and evaluated the length of sigmoid colon available.  Decision was made to mobilize the splenic flexure for additional length and to avoid tension on  our anastomosis.  The patient was placed in Trendelenburg positioning.  The descending colon was medialized and lifted off the retroperitoneum by dividing it's attachments at the white line of Toldt.  This dissection was carried up to the splenic flexure.  Then the mid to distal transverse colon was identified and the omentum was mobilized off the transverse colon working towards the spleen.  Then the splenocolic ligament was carefully divided, fully mobilizing the sigmoid colon out of the left upper quadrant.  Hemostasis was obtained and the descending colon now reached comfortably to the pelvis without tension so we directed our attention to closure.  The purse string device was used to place a PDS purse string on the proximal end of the anastamosis.  The colon was divided and the purse string inspected.  The purse string involved a significant amount of mesentary so decision was made to change our technique.  We sized the colon using the dilators and decided on the 29 mm circular stapler.  The anvil was brought onto the field.  A PDS purse string was positioned on the antimesenteric border of the descending colon, a few centimeters proximal to where it was divided.  The anvil was then introduced into the colon, and using it's spike, I pierced it  Into position at the antimesenteric border of the colon in the center of this purse string.  The colon was closed using a blue load of the contour stapler and hemostasis was obtained along this staple line using 2-0 silk to oversew any areas of bleeding.  With the anvil positioned, we positioned the stapler in the rectum. Digital rectal exam, followed by dilators, prepared the rectum for the stapler and identified the proper positioning.  The stapler was passed up the rectum into position.  The spike on the stappler was deployed and the anvil attached to the spike.  The stapler was closed, then fired and then removed.  The donuts were intact.  Air was insufflated  into the rectum with irrigation filling the pelvis and there was no bubbling confirming a negative leak test.  At this point attention was turned to closure.  The abdomen was irrigated copiously.  The fascia was closed using 2-0 PDS in running fashion.  A penrose drain was placed in the base of the wound and brought out the lower aspect of the incision.  3-0 vicryl was used to close the deep dermal space.  4-0 Monocryl was used to close the subcuticular layer down to the penrose drain.  Dermabond was placed over the incision and the penrose was covered with 4x4s and tape.  All sponge and needle counts were correct at the end of the case.   An MD assistant was necessary for tissue manipulation, retraction and positioning due to the complexity of the case and hospital policies   Ivar Drape, MD General, Bariatric, & Minimally Invasive Surgery [facility], [place]

## 2020-08-08 NOTE — Transfer of Care (Signed)
Immediate Anesthesia Transfer of Care Note  Patient: Deborah Chaney  Procedure(s) Performed: OPEN SIGMOID COLECTOMY (N/A Abdomen)  Patient Location: PACU  Anesthesia Type:General  Level of Consciousness: oriented, drowsy and patient cooperative  Airway & Oxygen Therapy: Patient Spontanous Breathing and Patient connected to face mask oxygen  Post-op Assessment: Report given to RN and Post -op Vital signs reviewed and stable  Post vital signs: Reviewed and stable  Last Vitals:  Vitals Value Taken Time  BP 142/85 08/08/20 1032  Temp    Pulse 105 08/08/20 1034  Resp 28 08/08/20 1034  SpO2 95 % 08/08/20 1034  Vitals shown include unvalidated device data.  Last Pain:  Vitals:   08/08/20 0613  TempSrc: Oral  PainSc:          Complications: No complications documented.

## 2020-08-08 NOTE — Anesthesia Procedure Notes (Signed)

## 2020-08-08 NOTE — Anesthesia Postprocedure Evaluation (Signed)
Anesthesia Post Note  Patient: Deborah Chaney  Procedure(s) Performed: OPEN SIGMOID COLECTOMY (N/A Abdomen)     Patient location during evaluation: PACU Anesthesia Type: General Level of consciousness: awake and alert Pain management: pain level controlled Vital Signs Assessment: post-procedure vital signs reviewed and stable Respiratory status: spontaneous breathing, nonlabored ventilation, respiratory function stable and patient connected to nasal cannula oxygen Cardiovascular status: blood pressure returned to baseline and stable Postop Assessment: no apparent nausea or vomiting Anesthetic complications: no   No complications documented.  Last Vitals:  Vitals:   08/08/20 1332 08/08/20 1438  BP: 119/76 120/78  Pulse: (!) 108 (!) 105  Resp: 17   Temp: (!) 36.3 C (!) 36.3 C  SpO2: 98% 98%    Last Pain:  Vitals:   08/08/20 1438  TempSrc: Oral  PainSc:                  Zaydee Aina S

## 2020-08-08 NOTE — H&P (Signed)
   Admitting Physician: Hyman Hopes Yurianna Tusing  Service: General Surgery  CC: Diverticulitis  Subjective   HPI: Deborah Chaney is an 55 y.o. female who is here for elective sigmoid colectomy for diverticulitis  Past Medical History:  Diagnosis Date  . Diverticulitis   . Hypertension   . Ovarian cyst   . PONV (postoperative nausea and vomiting)   . Sickle cell disease (HCC)     Past Surgical History:  Procedure Laterality Date  . ABDOMINAL HYSTERECTOMY    . APPENDECTOMY    . LAPAROSCOPIC OVARIAN CYSTECTOMY    . TUBAL LIGATION      Family History  Problem Relation Age of Onset  . Hypertension Mother   . Diabetes Mother   . Hypertension Father   . Diabetes Father     Social:  reports that she has never smoked. She has never used smokeless tobacco. She reports that she does not drink alcohol and does not use drugs.  Allergies:  Allergies  Allergen Reactions  . Penicillins Other (See Comments)    Head feels tight Face tightens up  . Shrimp (Diagnostic) Itching    Throat itching    Medications: Current Outpatient Medications  Medication Instructions  . acetaminophen (TYLENOL) 500-1,000 mg, Oral, Every 6 hours PRN  . amLODipine (NORVASC) 10 mg, Oral, Daily  . amoxicillin-clavulanate (AUGMENTIN) 875-125 MG tablet 1 tablet, Oral, Every 12 hours  . aspirin EC 81 mg, Oral, Daily, Swallow whole.  . ergocalciferol (VITAMIN D2) 50,000 Units, Oral, Every Mon  . melatonin 5 mg, Oral, At bedtime PRN  . saccharomyces boulardii (FLORASTOR) 250 mg, Oral, 2 times daily    ROS - all of the below systems have been reviewed with the patient and positives are indicated with bold text General: chills, fever or night sweats Eyes: blurry vision or double vision ENT: epistaxis or sore throat Allergy/Immunology: itchy/watery eyes or nasal congestion Hematologic/Lymphatic: bleeding problems, blood clots or swollen lymph nodes Endocrine: temperature intolerance or unexpected  weight changes Breast: new or changing breast lumps or nipple discharge Resp: cough, shortness of breath, or wheezing CV: chest pain or dyspnea on exertion GI: as per HPI GU: dysuria, trouble voiding, or hematuria MSK: joint pain or joint stiffness Neuro: TIA or stroke symptoms Derm: pruritus and skin lesion changes Psych: anxiety and depression  Objective   PE Blood pressure 123/77, pulse 83, temperature 98.3 F (36.8 C), temperature source Oral, resp. rate 16, SpO2 96 %. Constitutional: NAD; conversant; no deformities Eyes: Moist conjunctiva; no lid lag; anicteric; PERRL Neck: Trachea midline; no thyromegaly Lungs: Normal respiratory effort; no tactile fremitus CV: RRR; no palpable thrills; no pitting edema GI: Abd Soft, nontender; no palpable hepatosplenomegaly MSK: Normal range of motion of extremities; no clubbing/cyanosis Psychiatric: Appropriate affect; alert and oriented x3 Lymphatic: No palpable cervical or axillary lymphadenopathy  No results found for this or any previous visit (from the past 24 hour(s)).  Imaging Orders  No imaging studies ordered today     Assessment and Plan   Deborah Chaney is an 55 y.o. female with diverticulitis, here for elective sigmoid colectomy.  The risks, benefits, and alternatives were discussed and the patient granted consent to proceed.  We will proceed to the OR as scheduled.  Quentin Ore, MD  [facility] Use [email or web address] to contact on call provider

## 2020-08-08 NOTE — Consult Note (Signed)
WOC follow-up: Pt did not receive an ostomy during surgery today.  No further role for WOC nurse. Please re-consult if further assistance is needed.  Thank-you,  Cammie Mcgee MSN, RN, CWOCN, [place], CNS [phone]

## 2020-08-08 NOTE — Anesthesia Preprocedure Evaluation (Signed)
Anesthesia Evaluation  Patient identified by MRN, date of birth, ID band Patient awake    Reviewed: Allergy & Precautions, H&P , NPO status , Patient's Chart, lab work & pertinent test results  History of Anesthesia Complications (+) PONV  Airway Mallampati: II  TM Distance: >3 FB Neck ROM: Full    Dental no notable dental hx.    Pulmonary neg pulmonary ROS,    Pulmonary exam normal breath sounds clear to auscultation       Cardiovascular hypertension, Pt. on medications Normal cardiovascular exam Rhythm:Regular Rate:Normal     Neuro/Psych negative neurological ROS  negative psych ROS   GI/Hepatic negative GI ROS, Neg liver ROS,   Endo/Other  negative endocrine ROS  Renal/GU negative Renal ROS  negative genitourinary   Musculoskeletal negative musculoskeletal ROS (+)   Abdominal   Peds negative pediatric ROS (+)  Hematology negative hematology ROS (+)   Anesthesia Other Findings   Reproductive/Obstetrics negative OB ROS                             Anesthesia Physical Anesthesia Plan  ASA: II  Anesthesia Plan: General   Post-op Pain Management:    Induction: Intravenous  PONV Risk Score and Plan: 4 or greater and Ondansetron, Midazolam, Dexamethasone, Treatment may vary due to age or medical condition and Scopolamine patch - Pre-op  Airway Management Planned: Oral ETT  Additional Equipment:   Intra-op Plan:   Post-operative Plan: Extubation in OR  Informed Consent: I have reviewed the patients History and Physical, chart, labs and discussed the procedure including the risks, benefits and alternatives for the proposed anesthesia with the patient or authorized representative who has indicated his/her understanding and acceptance.     Dental advisory given  Plan Discussed with: CRNA and Surgeon  Anesthesia Plan Comments:         Anesthesia Quick Evaluation

## 2020-08-09 ENCOUNTER — Encounter (HOSPITAL_COMMUNITY): Payer: Self-pay | Admitting: Surgery

## 2020-08-09 LAB — BASIC METABOLIC PANEL
Anion gap: 9 (ref 5–15)
BUN: 10 mg/dL (ref 6–20)
CO2: 24 mmol/L (ref 22–32)
Calcium: 9.1 mg/dL (ref 8.9–10.3)
Chloride: 105 mmol/L (ref 98–111)
Creatinine, Ser: 0.98 mg/dL (ref 0.44–1.00)
GFR, Estimated: >60 mL/min (ref >60)
Glucose, Bld: 127 mg/dL — ABNORMAL HIGH (ref 70–99)
Potassium: 4.3 mmol/L (ref 3.5–5.1)
Sodium: 138 mmol/L (ref 135–145)

## 2020-08-09 LAB — CBC
HCT: 36.4 % (ref 36.0–46.0)
Hemoglobin: 12 g/dL (ref 12.0–15.0)
MCH: 29.8 pg (ref 26.0–34.0)
MCHC: 33 g/dL (ref 30.0–36.0)
MCV: 90.3 fL (ref 80.0–100.0)
Platelets: 304 10*3/uL (ref 150–400)
RBC: 4.03 MIL/uL (ref 3.87–5.11)
RDW: 13.3 % (ref 11.5–15.5)
WBC: 20.1 10*3/uL — ABNORMAL HIGH (ref 4.0–10.5)
nRBC: 0 % (ref 0.0–0.2)

## 2020-08-09 LAB — SURGICAL PATHOLOGY

## 2020-08-09 LAB — MAGNESIUM: Magnesium: 1.9 mg/dL (ref 1.7–2.4)

## 2020-08-09 NOTE — Progress Notes (Addendum)
Progress Note: General Surgery Service   Chief Complaint/Subjective: Deborah Chaney is a 55 year old female s/p open sigmoid colectomy on 08/08/2020, for sigmoid diverticulitis.  Doing well today, just taking some gabapentin and tylenol.  Pain okay.  No nausea or vomiting.  Tolerating liquids.  No flatus or bowel movements  Objective: Vital signs in last 24 hours: Temp:  [97.3 F (36.3 C)-99.4 F (37.4 C)] 98.4 F (36.9 C) (02/15 0533) Pulse Rate:  [78-108] 78 (02/15 0533) Resp:  [14-18] 18 (02/15 0533) BP: (115-142)/(65-85) 115/73 (02/15 0533) SpO2:  [93 %-98 %] 94 % (02/15 0533) Weight:  [111.3 kg] 111.3 kg (02/14 1133)    Intake/Output from previous day: 02/14 0701 - 02/15 0700 In: 3816.4 [P.O.:1200; I.V.:2366.4; IV Piggyback:250] Out: 2550 [Urine:2350; Blood:200] Intake/Output this shift: No intake/output data recorded.  Constitutional: NAD; conversant; no deformities Eyes: Moist conjunctiva; no lid lag; anicteric; PERRL Neck: Trachea midline; no thyromegaly Lungs: Normal respiratory effort; no tactile fremitus CV: RRR; no palpable thrills; no pitting edema GI: Abd incision c/d/i w/ glue and penrose drain exiting inferior portion of the incision with some dark drainage; no palpable hepatosplenomegaly MSK: Normal range of motion of extremities; no clubbing/cyanosis Psychiatric: Appropriate affect; alert and oriented x3 Lymphatic: No palpable cervical or axillary lymphadenopathy  Lab Results: CBC  Recent Labs    08/08/20 1045 08/09/20 0452  WBC 31.6* 20.1*  HGB 13.3 12.0  HCT 41.2 36.4  PLT 341 304   BMET Recent Labs    08/08/20 1045 08/09/20 0452  NA  --  138  K  --  4.3  CL  --  105  CO2  --  24  GLUCOSE  --  127*  BUN  --  10  CREATININE 1.05* 0.98  CALCIUM  --  9.1   PT/INR No results for input(s): LABPROT, INR in the last 72 hours. ABG No results for input(s): PHART, HCO3 in the last 72 hours.  Invalid input(s): PCO2,  PO2  Anti-infectives: Anti-infectives (From admission, onward)   Start     Dose/Rate Route Frequency Ordered Stop   08/08/20 1400  neomycin (MYCIFRADIN) tablet 1,000 mg  Status:  Discontinued       "And" Linked Group Details   1,000 mg Oral 3 times per day 08/08/20 0528 08/08/20 0531   08/08/20 1400  metroNIDAZOLE (FLAGYL) tablet 1,000 mg  Status:  Discontinued       "And" Linked Group Details   1,000 mg Oral 3 times per day 08/08/20 0528 08/08/20 0531   08/08/20 0732  ciprofloxacin (CIPRO) 400 MG/200ML IVPB       Note to Pharmacy: [facility]: cabinet override      08/08/20 0732 08/08/20 0742   08/08/20 0732  metroNIDAZOLE (FLAGYL) 5-0.79 MG/ML-% IVPB       Note to Pharmacy: [employer] [employer]: cabinet override      08/08/20 0732 08/08/20 0757   08/08/20 0730  ciprofloxacin (CIPRO) IVPB 400 mg        400 mg 200 mL/hr over 60 Minutes Intravenous  Once 08/08/20 0729 08/08/20 0805   08/08/20 0730  metroNIDAZOLE (FLAGYL) IVPB 500 mg        500 mg 100 mL/hr over 60 Minutes Intravenous  Once 08/08/20 0729 08/08/20 0806      Medications: Scheduled Meds: . acetaminophen  650 mg Oral Q6H  . alvimopan  12 mg Oral BID  . amLODipine  10 mg Oral Daily  . aspirin EC  81 mg Oral Daily  . enoxaparin (  LOVENOX) injection  40 mg Subcutaneous Q24H  . feeding supplement  237 mL Oral BID BM  . gabapentin  300 mg Oral TID   Continuous Infusions: PRN Meds:.HYDROmorphone (DILAUDID) injection, melatonin, ondansetron **OR** ondansetron (ZOFRAN) IV, oxyCODONE, oxyCODONE  Assessment/Plan: Deborah Chaney is a 55 year old female s/p open sigmoid colectomy on 08/08/2020, for sigmoid diverticulitis.  Doing well post op day 1 Advance diet Increase activity Pain/nausea control Entereg   LOS: 1 day   FEN: d/c IVF ID: None VTE: Lovenox, SCDs, Foley: Removed this AM Dispo: Continued care on the floor    Quentin Ore, MD  [facility] Use [email or web address] to  contact on call provider

## 2020-08-10 LAB — CBC
HCT: 35.3 % — ABNORMAL LOW (ref 36.0–46.0)
Hemoglobin: 11.4 g/dL — ABNORMAL LOW (ref 12.0–15.0)
MCH: 29.2 pg (ref 26.0–34.0)
MCHC: 32.3 g/dL (ref 30.0–36.0)
MCV: 90.5 fL (ref 80.0–100.0)
Platelets: 257 10*3/uL (ref 150–400)
RBC: 3.9 MIL/uL (ref 3.87–5.11)
RDW: 13.5 % (ref 11.5–15.5)
WBC: 13.3 10*3/uL — ABNORMAL HIGH (ref 4.0–10.5)
nRBC: 0 % (ref 0.0–0.2)

## 2020-08-10 LAB — BASIC METABOLIC PANEL
Anion gap: 8 (ref 5–15)
BUN: 10 mg/dL (ref 6–20)
CO2: 26 mmol/L (ref 22–32)
Calcium: 8.9 mg/dL (ref 8.9–10.3)
Chloride: 106 mmol/L (ref 98–111)
Creatinine, Ser: 0.94 mg/dL (ref 0.44–1.00)
GFR, Estimated: >60 mL/min (ref >60)
Glucose, Bld: 91 mg/dL (ref 70–99)
Potassium: 3.9 mmol/L (ref 3.5–5.1)
Sodium: 140 mmol/L (ref 135–145)

## 2020-08-10 MED ORDER — ACETAMINOPHEN 500 MG PO TABS: 500.0000 mg | ORAL_TABLET | Freq: Four times a day (QID) | ORAL | 0 refills | Status: AC | PRN

## 2020-08-10 MED ORDER — IBUPROFEN 800 MG PO TABS: 800.0000 mg | ORAL_TABLET | Freq: Three times a day (TID) | ORAL | 0 refills | Status: AC

## 2020-08-10 MED ORDER — HYDROCODONE-ACETAMINOPHEN 5-325 MG PO TABS: 1.0000 | ORAL_TABLET | ORAL | 0 refills | Status: DC | PRN

## 2020-08-10 NOTE — Discharge Summary (Signed)
  Patient ID: Deborah Chaney [id number] 55 y.o. 12-02-1965  08/08/2020  Discharge date and time: 08/10/2020  Admitting Physician: Hyman Hopes Norene Oliveri  Discharge Physician: Hyman Hopes Betsy Rosello  Admission Diagnoses: Diverticulitis of intestine with abscess [K57.80] Patient Active Problem List   Diagnosis Date Noted  . Diverticulitis of intestine with abscess 08/08/2020  . Diverticulitis of colon with perforation 06/20/2020  . Sepsis (HCC) 06/20/2020  . Obesity, Class III, BMI 40-49.9 (morbid obesity) (HCC) 06/20/2020  . Breast lump 02/21/2017  . Cyst of ovary 02/21/2017  . Lower abdominal pain 02/21/2017  . Obesity 02/21/2017  . Upper respiratory infection 02/21/2017     Discharge Diagnoses: sigmoid diverticulitis   Patient Active Problem List   Diagnosis Date Noted  . Diverticulitis of intestine with abscess 08/08/2020  . Diverticulitis of colon with perforation 06/20/2020  . Sepsis (HCC) 06/20/2020  . Obesity, Class III, BMI 40-49.9 (morbid obesity) (HCC) 06/20/2020  . Breast lump 02/21/2017  . Cyst of ovary 02/21/2017  . Lower abdominal pain 02/21/2017  . Obesity 02/21/2017  . Upper respiratory infection 02/21/2017    Operations: 08/08/2020 - open sigmoid colectomy OPEN SIGMOID COLECTOMY  Admission Condition: good  Discharged Condition: good  Indication for Admission: Sigmoid diverticulitis  Hospital Course: Ms. Deborah Chaney presented for elective sigmoid colectomy, recovered well and was discharged.  Consults: None  Significant Diagnostic Studies: None  Treatments: surgery: as above  Disposition: Home  Patient Instructions:  Allergies as of 08/10/2020      Reactions   Penicillins Other (See Comments)   Head feels tight Face tightens up   Shrimp (diagnostic) Itching   Throat itching      Medication List    STOP taking these medications   amoxicillin-clavulanate 875-125 MG tablet Commonly known as: AUGMENTIN     TAKE these medications    acetaminophen 500 MG tablet Commonly known as: TYLENOL Take 1 tablet (500 mg total) by mouth every 6 (six) hours as needed. What changed:   how much to take  reasons to take this   amLODipine 10 MG tablet Commonly known as: NORVASC Take 10 mg by mouth daily.   aspirin EC 81 MG tablet Take 81 mg by mouth daily. Swallow whole.   ergocalciferol 1.25 MG (50000 UT) capsule Commonly known as: VITAMIN D2 Take 50,000 Units by mouth every Monday.   HYDROcodone-acetaminophen 5-325 MG tablet Commonly known as: NORCO/VICODIN Take 1 tablet by mouth every 4 (four) hours as needed for severe pain.   ibuprofen 800 MG tablet Commonly known as: ADVIL Take 1 tablet (800 mg total) by mouth every 8 (eight) hours for 7 days.   melatonin 5 MG Tabs Take 5 mg by mouth at bedtime as needed (sleep).   saccharomyces boulardii 250 MG capsule Commonly known as: FLORASTOR Take 1 capsule (250 mg total) by mouth 2 (two) times daily.       Activity: no heavy lifting for 4 weeks Diet: regular diet Wound Care: keep wound clean and dry  Follow-up:  With Dr. Dossie Der as previously scheduled  Signed: Hyman Hopes Antia [facility] [place]   08/10/2020, 7:21 AM

## 2020-08-10 NOTE — Progress Notes (Signed)
Pt alert and oriented, tolerating diet, drain removed. D/C instructions given. Pt d/cd to home.

## 2020-08-10 NOTE — Discharge Instructions (Signed)
COLECTOMY POST OPERATIVE INSTRUCTIONS  Thinking Clearly  . The anesthesia may cause you to feel different for 1 or 2 days. Do not drive, drink alcohol, or make any big decisions for at least 2 days.  Nutrition . When you wake up, you will be able to drink small amounts of liquid. If you do not feel sick, you can slowly advance your diet to regular foods. . Continue to drink lots of fluids, usually about 8 to 10 glasses per day. . Eat a high-fiber diet so you don't strain during bowel movements. . High-Fiber Foods o Foods high in fiber include beans, bran cereals and whole-grain breads, peas, dried fruit (figs, apricots, and dates), raspberries, blackberries, strawberries, sweet corn, broccoli, baked potatoes with skin, plums, pears, apples, greens, and nuts. Activity . Slowly increase your activity. Be sure to get up and walk every hour or so to prevent blood clots. . No heavy lifting or strenuous activity for 4 weeks following surgery to prevent hernias at your incision sites . It is normal to feel tired. You may need more sleep than usual.  Get your rest but make sure to get up and move around frequently to prevent blood clots and pneumonia.  Work and Return to [employer] . You can go back to work when you feel well enough. Discuss the timing with your surgeon. . You can usually go back to school or work 1 week or less after an laparoscopic surgery and two weeks after an open surgery. . If your work requires heavy lifting or strenuous activity you need to be placed on light duty for 4 weeks following surgery. . You can return to gym class, sports or other physical activities 4 weeks after surgery.  Wound Care . Always wash your hands before and after touching near your incision site. . Do not soak in a bathtub until cleared at your follow up appointment. You may take a shower 24 hours after surgery. . A small amount of drainage from the incision is normal. If the drainage is thick and  yellow or the site is red, you may have an infection, so call your surgeon. . If you have a drain in one of your incisions, it will be taken out in office when the drainage stops. . Steri-Strips will fall off in 7 to 10 days or they will be removed during your first office visit. . If you have dermabond glue covering over the incision, allow the glue to flake off on its own. Mar[place] Kitchen Avoid wearing tight or rough clothing. It may rub your incisions and make it harder for them to heal. . Protect the new skin, especially from the sun. The sun can burn and cause darker scarring. . Your scar will heal in about 4 to 6 weeks and will become softer and continue to fade over the next year.  The cosmetic appearance of the incisions will improve over the course of the first year after surgery. . Sensation around your incision will return in a few weeks or months.  Bowel Movements . After intestinal surgery, you may have loose watery stools for several days. If watery diarrhea lasts longer than 3 days, contact your surgeon. . Pain medication (narcotics) can cause constipation. Increase the fiber in your diet with high-fiber foods if you are constipated. You can take an over the counter stool softener like Colace to avoid constipation.  Additional over the counter medications can also be used if Colace isn't sufficient (for example, Milk of  Magnesia or Miralax).  Pain . The amount of pain is different for each person. Some people need only 1 to 3 doses of pain control medication, while others need more. . Take alternating doses of tylenol and ibuprofen around the clock for the first five days following surgery.  This will provide a baseline of pain control and help with inflammation.  Take the narcotic pain medication in addition if needed for severe pain.  Contact Your Surgeon at [phone], if you have: . Pain that will not go away . Pain that gets worse . A fever of more than 101F (38.3C) . Repeated  vomiting . Swelling, redness, bleeding, or bad-smelling drainage from your wound site . Strong abdominal pain . No bowel movement or unable to pass gas for 3 days . Watery diarrhea lasting longer than 3 days  Pain Control . The goal of pain control is to minimize pain, keep you moving and help you heal. Your surgical team will work with you on your pain plan. Most often a combination of therapies and medications are used to control your pain. You may also be given medication (local anesthetic) at the surgical site. This may help control your pain for several days. . Extreme pain puts extra stress on your body at a time when your body needs to focus on healing. Do not wait until your pain has reached a level "10" or is unbearable before telling your doctor or nurse. It is much easier to control pain before it becomes severe. . Following a laparoscopic procedure, pain is sometimes felt in the shoulder. This is due to the gas inserted into your abdomen during the procedure. Moving and walking helps to decrease the gas and the right shoulder pain.  . Use the guide below for ways to manage your post-operative pain. Learn more by going to facs.org/safepaincontrol.  How Intense Is My Pain Common Therapies to Feel Better       I hardly notice my pain, and it does not interfere with my activities.  I notice my pain and it distracts me, but I can still do activities (sitting up, walking, standing).  Non-Medication Therapies  Ice (in a bag, applied over clothing at the surgical site), elevation, rest, meditation, massage, distraction (music, TV, play) walking and mild exercise Splinting the abdomen with pillows +  Non-Opioid Medications Acetaminophen (Tylenol) Non-steroidal anti-inflammatory drugs (NSAIDS) Aspirin, Ibuprofen (Motrin, Advil) Naproxen (Aleve) Take these as needed, when you feel pain. Both acetaminophen and NSAIDs help to decrease pain and swelling (inflammation).      My  pain is hard to ignore and is more noticeable even when I rest.  My pain interferes with my usual activities.  Non-Medication Therapies  +  Non-Opioid medications  Take on a regular schedule (around-the-clock) instead of as needed. (For example, Tylenol every 6 hours at 9:00 am, 3:00 pm, 9:00 pm, 3:00 am and Motrin every 6 hours at 12:00 am, 6:00 am, 12:00 pm, 6:00 pm)         I am focused on my pain, and I am not doing my daily activities.  I am groaning in pain, and I cannot sleep. I am unable to do anything.  My pain is as bad as it could be, and nothing else matters.  Non-Medication Therapies  +  Around-the-Clock Non-Opioid Medications  +  Short-acting opioids  Opioids should be used with other medications to manage severe pain. Opioids block pain and give a feeling of euphoria (feel high). Addiction, a  serious side effect of opioids, is rare with short-term (a few days) use.  Examples of short-acting opioids include: Tramadol (Ultram), Hydrocodone (Norco, Vicodin), Hydromorphone (Dilaudid), Oxycodone (Oxycontin)     The above directions have been adapted from the [employer] Surgical Patient Education Program.  Please refer to the ACS website if needed: [email or web address].[email or web address]   Ivar Drape, MD [facility] [address], [place], [place]  [address] ?  [address] [address], [place], [place]   [address] [phone] ? [phone] ? FAX [phone] Web site: [email or web address]

## 2020-08-24 ENCOUNTER — Other Ambulatory Visit: Payer: Self-pay

## 2020-08-24 ENCOUNTER — Encounter (INDEPENDENT_AMBULATORY_CARE_PROVIDER_SITE_OTHER): Payer: Self-pay | Admitting: Family Medicine

## 2020-08-24 ENCOUNTER — Ambulatory Visit (INDEPENDENT_AMBULATORY_CARE_PROVIDER_SITE_OTHER): Payer: BC Managed Care – PPO | Admitting: Family Medicine

## 2020-08-24 VITALS — BP 123/79 | HR 95 | Temp 98.2°F | Ht 64.0 in | Wt 223.0 lb

## 2020-08-24 DIAGNOSIS — Z6838 Body mass index (BMI) 38.0-38.9, adult: Secondary | ICD-10-CM

## 2020-08-24 DIAGNOSIS — Z0289 Encounter for other administrative examinations: Secondary | ICD-10-CM

## 2020-08-24 DIAGNOSIS — Z1331 Encounter for screening for depression: Secondary | ICD-10-CM | POA: Diagnosis not present

## 2020-08-24 DIAGNOSIS — E559 Vitamin D deficiency, unspecified: Secondary | ICD-10-CM

## 2020-08-24 DIAGNOSIS — Z9189 Other specified personal risk factors, not elsewhere classified: Secondary | ICD-10-CM | POA: Diagnosis not present

## 2020-08-24 DIAGNOSIS — R0602 Shortness of breath: Secondary | ICD-10-CM

## 2020-08-24 DIAGNOSIS — I10 Essential (primary) hypertension: Secondary | ICD-10-CM

## 2020-08-24 DIAGNOSIS — R5383 Other fatigue: Secondary | ICD-10-CM

## 2020-08-24 DIAGNOSIS — K5720 Diverticulitis of large intestine with perforation and abscess without bleeding: Secondary | ICD-10-CM

## 2020-08-24 DIAGNOSIS — E782 Mixed hyperlipidemia: Secondary | ICD-10-CM

## 2020-08-25 LAB — CBC WITH DIFFERENTIAL/PLATELET
Basophils Absolute: 0.1 10*3/uL (ref 0.0–0.2)
Basos: 1 %
EOS (ABSOLUTE): 0.1 10*3/uL (ref 0.0–0.4)
Eos: 1 %
Hematocrit: 42.4 % (ref 34.0–46.6)
Hemoglobin: 14.3 g/dL (ref 11.1–15.9)
Immature Grans (Abs): 0 10*3/uL (ref 0.0–0.1)
Immature Granulocytes: 0 %
Lymphocytes Absolute: 1.6 10*3/uL (ref 0.7–3.1)
Lymphs: 21 %
MCH: 30 pg (ref 26.6–33.0)
MCHC: 33.7 g/dL (ref 31.5–35.7)
MCV: 89 fL (ref 79–97)
Monocytes Absolute: 0.4 10*3/uL (ref 0.1–0.9)
Monocytes: 6 %
Neutrophils Absolute: 5.3 10*3/uL (ref 1.4–7.0)
Neutrophils: 71 %
Platelets: 330 10*3/uL (ref 150–450)
RBC: 4.77 x10E6/uL (ref 3.77–5.28)
RDW: 13.4 % (ref 11.7–15.4)
WBC: 7.5 10*3/uL (ref 3.4–10.8)

## 2020-08-25 LAB — T4: T4, Total: 7.9 ug/dL (ref 4.5–12.0)

## 2020-08-25 LAB — COMPREHENSIVE METABOLIC PANEL
ALT: 15 IU/L (ref 0–32)
AST: 12 IU/L (ref 0–40)
Albumin/Globulin Ratio: 1.8 (ref 1.2–2.2)
Albumin: 4.4 g/dL (ref 3.8–4.9)
Alkaline Phosphatase: 96 IU/L (ref 44–121)
BUN/Creatinine Ratio: 15 (ref 9–23)
BUN: 14 mg/dL (ref 6–24)
Bilirubin Total: 0.6 mg/dL (ref 0.0–1.2)
CO2: 22 mmol/L (ref 20–29)
Calcium: 9.8 mg/dL (ref 8.7–10.2)
Chloride: 102 mmol/L (ref 96–106)
Creatinine, Ser: 0.94 mg/dL (ref 0.57–1.00)
Globulin, Total: 2.4 g/dL (ref 1.5–4.5)
Glucose: 95 mg/dL (ref 65–99)
Potassium: 4.5 mmol/L (ref 3.5–5.2)
Sodium: 140 mmol/L (ref 134–144)
Total Protein: 6.8 g/dL (ref 6.0–8.5)
eGFR: 71 mL/min/{1.73_m2} (ref >59)

## 2020-08-25 LAB — INSULIN, RANDOM: INSULIN: 15.4 u[IU]/mL (ref 2.6–24.9)

## 2020-08-25 LAB — FOLATE: Folate: 4 ng/mL (ref >3.0)

## 2020-08-25 LAB — VITAMIN B12: Vitamin B-12: 580 pg/mL (ref 232–1245)

## 2020-08-25 LAB — T3: T3, Total: 142 ng/dL (ref 71–180)

## 2020-08-25 NOTE — Progress Notes (Signed)
Dear Dr. Nancy Fetter,   Thank you for referring Deborah Chaney to our clinic. The following note includes my evaluation and treatment recommendations.  Chief Complaint:   OBESITY Deborah Chaney (MR# [id number]) is a 55 y.o. female who presents for evaluation and treatment of obesity and related comorbidities. Current BMI is Body mass index is 38.28 kg/m. Deborah Chaney has been struggling with her weight for many years and has been unsuccessful in either losing weight, maintaining weight loss, or reaching her healthy weight goal.  Deborah Chaney is currently in the action stage of change and ready to dedicate time achieving and maintaining a healthier weight. Deborah Chaney is interested in becoming our patient and working on intensive lifestyle modifications including (but not limited to) diet and exercise for weight loss.  Deborah Chaney was referred by Dr. Nancy Fetter. She recently had colonectomy. She is a juvenile Teacher, English as a foreign language. She had recent weight gain (last 2+ years) of comfort eating. Breakfast- 1 egg, 2 slices toast, bacon, with fried potatoes and onions (1 cup) (felt full); Lunch- sandwich from [employer] with crinkle fries and diet soda (feels stuffed); Dinner- meatloaf, mac n cheese (1 cup), and cabbage (1 cup) (feel full).  Deborah Chaney's habits were reviewed today and are as follows: Her family eats meals together, her desired weight loss is 73 lbs, she started gaining weight at age 33, her heaviest weight ever was 247 pounds, she has significant food cravings issues, she skips meals frequently, she is frequently drinking liquids with calories, she frequently makes poor food choices, she frequently eats larger portions than normal, she has binge eating behaviors and she struggles with emotional eating.  Depression Screen Deborah Chaney's Food and Mood (modified PHQ-9) score was 8.  Depression screen [name] 2/9 08/24/2020  Decreased Interest 2  Down, Depressed, Hopeless 1  PHQ - 2 Score 3  Altered sleeping 1  Tired,  decreased energy 2  Change in appetite 1  Feeling bad or failure about yourself  1  Trouble concentrating 0  Moving slowly or fidgety/restless 0  Suicidal thoughts 0  PHQ-9 Score 8  Difficult doing work/chores Not difficult at all   Subjective:   1. Other fatigue [name] admits to daytime somnolence and denies waking up still tired. Patent has a history of symptoms of morning headache. [name] generally gets 6 hours of sleep per night, and states that she has generally restful sleep. Snoring is present. Apneic episodes are not present. Epworth Sleepiness Score is 10. EKG normal sinus rhythm at 98 bpm. Recent ST of 2.22 on 05/30/2020.  2. SOB (shortness of breath) on exertion Man notes increasing shortness of breath with exercising and seems to be worsening over time with weight gain. She notes getting out of breath sooner with activity than she used to. This has gotten worse recently. [name] denies shortness of breath at rest or orthopnea.  3. Essential hypertension [name] is on amlodipine. Her BP is well controlled.  Pt denies chest pain, chest pressure and headache. She was diagnosed 2 years ago.  BP Readings from Last 3 Encounters:  08/24/20 123/79  08/10/20 107/75  08/04/20 112/82    4. Vitamin D deficiency Deborah Chaney is on 1 time a week supplement. Her last Vit D level was 44 on 05/30/2020.  5. Mixed hyperlipidemia Deborah Chaney's last lipid panel: LDL 107, HDL 62, triglycerides 105 on 05/30/2020. She is not on statin therapy.  6. Diverticulitis of large intestine with abscess, unspecified bleeding status Deborah Chaney had a recent colonectomy 2 weeks ago.  7.  At risk for constipation Deborah Chaney is at increased risk for constipation due recent colon surgery.  Assessment/Plan:   1. Other fatigue Deborah Chaney does feel that her weight is causing her energy to be lower than it should be. Fatigue may be related to obesity, depression or many other causes. Labs will be ordered, and in the meanwhile, Deborah Chaney will  focus on self care including making healthy food choices, increasing physical activity and focusing on stress reduction. Check labs today.  - EKG 12-Lead - Vitamin B12 - Folate - Insulin, random - T3 - T4  2. SOB (shortness of breath) on exertion Deborah Chaney does feel that she gets out of breath more easily that she used to when she exercises. Deborah Chaney's shortness of breath appears to be obesity related and exercise induced. She has agreed to work on weight loss and gradually increase exercise to treat her exercise induced shortness of breath. Will continue to monitor closely.  3. Essential hypertension Deborah Chaney is working on healthy weight loss and exercise to improve blood pressure control. We will watch for signs of hypotension as she continues her lifestyle modifications. Check labs today.  4. Vitamin D deficiency Low Vitamin D level contributes to fatigue and are associated with obesity, breast, and colon cancer. She agrees to continue to take prescription Vitamin D _0 ,000 IU every week and will follow-up for routine testing of Vitamin D, at least 2-3 times per year to avoid over-replacement.  5. Mixed hyperlipidemia Cardiovascular risk and specific lipid/LDL goals reviewed.  We discussed several lifestyle modifications today and [name] will continue to work on diet, exercise and weight loss efforts. Orders and follow up as documented in patient record. Check labs in 3 months.  Counseling Intensive lifestyle modifications are the first line treatment for this issue. . Dietary changes: Increase soluble fiber. Decrease simple carbohydrates. . Exercise changes: Moderate to vigorous-intensity aerobic activity 150 minutes per week if tolerated. . Lipid-lowering medications: see documented in medical record.  6. Diverticulitis of large intestine with abscess, unspecified bleeding status Check labs today.  - CBC with Differential/Platelet - Comprehensive metabolic panel  7. Depression  screening Deborah Chaney had a positive depression screening. Depression is commonly associated with obesity and often results in emotional eating behaviors. We will monitor this closely and work on CBT to help improve the non-hunger eating patterns. Referral to Psychology may be required if no improvement is seen as she continues in our clinic.  8. At risk for constipation Deborah Chaney was given approximately 15 minutes of counseling today regarding prevention of constipation. She was encouraged to increase water and fiber intake.   9. Class 2 severe obesity with serious comorbidity and body mass index (BMI) of 38.0 to 38.9 in adult, unspecified obesity type (HCC) Deborah Chaney is currently in the action stage of change and her goal is to continue with weight loss efforts. I recommend Deborah Chaney begin the structured treatment plan as follows:  She has agreed to the Category 3 Plan + 100 calories.  Exercise goals: No exercise has been prescribed at this time.   Behavioral modification strategies: increasing lean protein intake, meal planning and cooking strategies, keeping healthy foods in the home and planning for success.  She was informed of the importance of frequent follow-up visits to maximize her success with intensive lifestyle modifications for her multiple health conditions. She was informed we would discuss her lab results at her next visit unless there is a critical issue that needs to be addressed sooner. Deborah Chaney agreed to keep her next visit  at the agreed upon time to discuss these results.  Objective:   Blood pressure 123/79, pulse 95, temperature 98.2 F (36.8 C), temperature source Oral, height _0  (1.626 m), weight 223 lb (101.2 kg), SpO2 96 %. Body mass index is 38.28 kg/m.  EKG: Normal sinus rhythm, rate 98.  Indirect Calorimeter completed today shows a VO2 of 301 and a REE of 2093.  Her calculated basal metabolic rate is 5929 thus her basal metabolic rate is better than expected.  General:  Cooperative, alert, well developed, in no acute distress. HEENT: Conjunctivae and lids unremarkable. Cardiovascular: Regular rhythm.  Lungs: Normal work of breathing. Neurologic: No focal deficits.   Lab Results  Component Value Date   CREATININE 0.94 08/24/2020   BUN 14 08/24/2020   NA 140 08/24/2020   K 4.5 08/24/2020   CL 102 08/24/2020   CO2 22 08/24/2020   Lab Results  Component Value Date   ALT 15 08/24/2020   AST 12 08/24/2020   ALKPHOS 96 08/24/2020   BILITOT 0.6 08/24/2020   Lab Results  Component Value Date   HGBA1C 5.1 07/06/2020   Lab Results  Component Value Date   INSULIN 15.4 08/24/2020   No results found for: TSH No results found for: CHOL, HDL, LDLCALC, LDLDIRECT, TRIG, CHOLHDL Lab Results  Component Value Date   WBC 7.5 08/24/2020   HGB 14.3 08/24/2020   HCT 42.4 08/24/2020   MCV 89 08/24/2020   PLT 330 08/24/2020    Attestation Statements:   Reviewed by clinician on day of visit: allergies, medications, problem list, medical history, surgical history, family history, social history, and previous encounter notes.  This is the patient's first visit at [facility]. The patient's NEW PATIENT PACKET was reviewed at length. Included in the packet: current and past health history, medications, allergies, ROS, gynecologic history (women only), surgical history, family history, social history, weight history, weight loss surgery history (for those that have had weight loss surgery), nutritional evaluation, mood and food questionnaire, PHQ9, Epworth questionnaire, sleep habits questionnaire, patient life and health improvement goals questionnaire. These will all be scanned into the patient's chart under media.   During the visit, I independently reviewed the patient's EKG, bioimpedance scale results, and indirect calorimeter results. I used this information to tailor a meal plan for the patient that will help her to lose weight and will improve  her obesity-related conditions going forward. I performed a medically necessary appropriate examination and/or evaluation. I discussed the assessment and treatment plan with the patient. The patient was provided an opportunity to ask questions and all were answered. The patient agreed with the plan and demonstrated an understanding of the instructions. Labs were ordered at this visit and will be reviewed at the next visit unless more critical results need to be addressed immediately. Clinical information was updated and documented in the EMR.   Time spent on visit including pre-visit chart review and post-visit care was 45 minutes.   A separate 15 minutes was spent on risk counseling (see above).    Coral Ceo, am acting as transcriptionist for Coralie Common, MD.   I have reviewed the above documentation for accuracy and completeness, and I agree with the above. - Jinny Blossom, MD

## 2020-09-07 ENCOUNTER — Encounter (INDEPENDENT_AMBULATORY_CARE_PROVIDER_SITE_OTHER): Payer: Self-pay | Admitting: Family Medicine

## 2020-09-07 ENCOUNTER — Ambulatory Visit (INDEPENDENT_AMBULATORY_CARE_PROVIDER_SITE_OTHER): Payer: BC Managed Care – PPO | Admitting: Family Medicine

## 2020-09-07 ENCOUNTER — Other Ambulatory Visit: Payer: Self-pay

## 2020-09-07 VITALS — BP 113/73 | HR 87 | Temp 98.3°F | Ht 64.0 in | Wt 224.0 lb

## 2020-09-07 DIAGNOSIS — E559 Vitamin D deficiency, unspecified: Secondary | ICD-10-CM

## 2020-09-07 DIAGNOSIS — E8881 Metabolic syndrome: Secondary | ICD-10-CM

## 2020-09-07 DIAGNOSIS — E782 Mixed hyperlipidemia: Secondary | ICD-10-CM | POA: Diagnosis not present

## 2020-09-07 DIAGNOSIS — Z9189 Other specified personal risk factors, not elsewhere classified: Secondary | ICD-10-CM | POA: Diagnosis not present

## 2020-09-07 DIAGNOSIS — Z6838 Body mass index (BMI) 38.0-38.9, adult: Secondary | ICD-10-CM

## 2020-09-12 NOTE — Progress Notes (Signed)
Chief Complaint:   OBESITY Deborah Chaney is here to discuss her progress with her obesity treatment plan along with follow-up of her obesity related diagnoses. Deborah Chaney is on the Category 3 Plan and states she is following her eating plan approximately 60-65% of the time. Deborah Chaney states she is doing 0 minutes 0 times per week.  Today's visit was #: 2 Starting weight: 223 lbs Starting date: 08/24/2020 Today's weight: 224 lbs Today's date: 09/07/2020 Total lbs lost to date: 0 Total lbs lost since last in-office visit: 0  Interim History: Deborah Chaney had some unexpected obstacles int he first few weeks. Over the next few weeks, she has no plans for excursions. Visitors, and is returning to work at the end of the month. Dinner has been the most difficult in terms of both quantity and actually fixing dinner.  Subjective:   1. Vitamin D deficiency Pt denies nausea, vomiting, and muscle weakness but notes fatigue. Pt is on prescription Vit D. Her last Vit D level was 44.4.  2. Mixed hyperlipidemia Deborah Chaney's last LDL 107, HDL 62, and triglycerides 412. She is not on statin therapy. Her 10 year ASCVD risk is 2.9%.   3. Insulin resistance Deborah Chaney's A1c 5.1 and insulin level 15.4. She is not meds.   Lab Results  Component Value Date   INSULIN 15.4 08/24/2020   Lab Results  Component Value Date   HGBA1C 5.1 07/06/2020    4. At risk for diabetes mellitus Deborah Chaney is at higher than average risk for developing diabetes due to obesity.   Assessment/Plan:   1. Vitamin D deficiency Low Vitamin D level contributes to fatigue and are associated with obesity, breast, and colon cancer. She agrees to continue to take prescription Vitamin D @50 ,000 IU every week and will follow-up for routine testing of Vitamin D, at least 2-3 times per year to avoid over-replacement.  2. Mixed hyperlipidemia Cardiovascular risk and specific lipid/LDL goals reviewed.  We discussed several lifestyle modifications today and Deborah Chaney will  continue to work on diet, exercise and weight loss efforts. Orders and follow up as documented in patient record. Repeat labs in 3 months.  Counseling Intensive lifestyle modifications are the first line treatment for this issue. . Dietary changes: Increase soluble fiber. Decrease simple carbohydrates. . Exercise changes: Moderate to vigorous-intensity aerobic activity 150 minutes per week if tolerated. . Lipid-lowering medications: see documented in medical record.  3. Insulin resistance Deborah Chaney will continue to work on weight loss, exercise, and decreasing simple carbohydrates to help decrease the risk of diabetes. Deborah Chaney agreed to follow-up with Deborah Chaney as directed to closely monitor her progress. Medication options discussed today, but pt is to continue category 3/pescatarian + 300 calories. If weight loss stalls or hunger increases, we will consider meds.  4. At risk for diabetes mellitus Deborah Chaney was given approximately 30 minutes of diabetes education and counseling today. We discussed intensive lifestyle modifications today with an emphasis on weight loss as well as increasing exercise and decreasing simple carbohydrates in her diet. We also reviewed medication options with an emphasis on risk versus benefit of those discussed.   Repetitive spaced learning was employed today to elicit superior memory formation and behavioral change.  5. Class 2 severe obesity with serious comorbidity and body mass index (BMI) of 38.0 to 38.9 in adult, unspecified obesity type (HCC) Deborah Chaney is currently in the action stage of change. As such, her goal is to continue with weight loss efforts. She has agreed to the Category 3 Plan.  Exercise goals: No exercise has been prescribed at this time.  Behavioral modification strategies: increasing lean protein intake, meal planning and cooking strategies, keeping healthy foods in the home and planning for success.  Deborah Chaney has agreed to follow-up with our clinic in 2 weeks.  She was informed of the importance of frequent follow-up visits to maximize her success with intensive lifestyle modifications for her multiple health conditions.   Objective:   Blood pressure 113/73, pulse 87, temperature 98.3 F (36.8 C), temperature source Oral, height 5\' 4"  (1.626 m), weight 224 lb (101.6 kg), SpO2 99 %. Body mass index is 38.45 kg/m.  General: Cooperative, alert, well developed, in no acute distress. HEENT: Conjunctivae and lids unremarkable. Cardiovascular: Regular rhythm.  Lungs: Normal work of breathing. Neurologic: No focal deficits.   Lab Results  Component Value Date   CREATININE 0.94 08/24/2020   BUN 14 08/24/2020   NA 140 08/24/2020   K 4.5 08/24/2020   CL 102 08/24/2020   CO2 22 08/24/2020   Lab Results  Component Value Date   ALT 15 08/24/2020   AST 12 08/24/2020   ALKPHOS 96 08/24/2020   BILITOT 0.6 08/24/2020   Lab Results  Component Value Date   HGBA1C 5.1 07/06/2020   Lab Results  Component Value Date   INSULIN 15.4 08/24/2020   No results found for: TSH No results found for: CHOL, HDL, LDLCALC, LDLDIRECT, TRIG, CHOLHDL Lab Results  Component Value Date   WBC 7.5 08/24/2020   HGB 14.3 08/24/2020   HCT 42.4 08/24/2020   MCV 89 08/24/2020   PLT 330 08/24/2020   No results found for: IRON, TIBC, FERRITIN  Attestation Statements:   Reviewed by clinician on day of visit: allergies, medications, problem list, medical history, surgical history, family history, social history, and previous encounter notes.  10/24/2020, am acting as transcriptionist for Edmund Hilda, MD.   I have reviewed the above documentation for accuracy and completeness, and I agree with the above. - Deborah Likes, MD

## 2020-09-27 ENCOUNTER — Encounter (INDEPENDENT_AMBULATORY_CARE_PROVIDER_SITE_OTHER): Payer: Self-pay | Admitting: Family Medicine

## 2020-09-27 ENCOUNTER — Ambulatory Visit (INDEPENDENT_AMBULATORY_CARE_PROVIDER_SITE_OTHER): Payer: BC Managed Care – PPO | Admitting: Family Medicine

## 2020-09-27 ENCOUNTER — Other Ambulatory Visit: Payer: Self-pay

## 2020-09-27 VITALS — BP 110/74 | HR 94 | Temp 97.9°F | Ht 64.0 in

## 2020-09-27 DIAGNOSIS — E8881 Metabolic syndrome: Secondary | ICD-10-CM

## 2020-09-27 DIAGNOSIS — I10 Essential (primary) hypertension: Secondary | ICD-10-CM | POA: Diagnosis not present

## 2020-09-27 DIAGNOSIS — Z6838 Body mass index (BMI) 38.0-38.9, adult: Secondary | ICD-10-CM

## 2020-09-27 DIAGNOSIS — Z9189 Other specified personal risk factors, not elsewhere classified: Secondary | ICD-10-CM

## 2020-09-27 MED ORDER — AMLODIPINE BESYLATE 5 MG PO TABS
5.0000 mg | ORAL_TABLET | Freq: Every day | ORAL | 0 refills | Status: DC
Start: 2020-09-27 — End: 2020-10-11

## 2020-10-02 NOTE — Progress Notes (Signed)
Chief Complaint:   OBESITY Deborah Chaney is here to discuss her progress with her obesity treatment plan along with follow-up of her obesity related diagnoses. Deborah Chaney is on the Category 3 Plan and states she is following her eating plan approximately 80% of the time. Deborah Chaney states she is walking at work 30-40 minutes 5 times per week.  Today's visit was #: 3 Starting weight: 223 lbs Starting date: 08/24/2020 Today's weight: 222 lbs Today's date: 09/27/2020 Total lbs lost to date: 1 Total lbs lost since last in-office visit: 2  Interim History: Deborah Chaney is working on getting all food in over last few weeks. After eating meals, pt is feeling satisfied. She is using cheese, yogurt, and cottage cheese for snacks. Pt does still crave pretzels, chips, and crackers. She is planning for [employer] celebration with her family and kids.  Subjective:   1. Essential hypertension Deborah Chaney's BP is well controlled again today. She denies chest pain, chest pressure and headache.  2. Insulin resistance Deborah Chaney's las insulin level was 15.4. She is not on medication. She still has some cravings for salty crunchy.  3. At risk for heart disease Deborah Chaney is at a higher than average risk for cardiovascular disease due to obesity.   Assessment/Plan:   1. Essential hypertension Deborah Chaney is working on healthy weight loss and exercise to improve blood pressure control. We will watch for signs of hypotension as she continues her lifestyle modifications. Start 5 mg amlodipine. Goal is to titrate down to 2.5 mg, then off.  - amLODipine (NORVASC) 5 MG tablet; Take 1 tablet (5 mg total) by mouth daily.  Dispense: 30 tablet; Refill: 0  2. Insulin resistance Deborah Chaney will continue to work on weight loss, exercise, and decreasing simple carbohydrates to help decrease the risk of diabetes. Deborah Chaney agreed to follow-up with [place] as directed to closely monitor her progress. Follow up with 2 months.  3. At risk for heart disease Deborah Chaney was given  approximately 15 minutes of coronary artery disease prevention counseling today. She is 55 y.o. female and has risk factors for heart disease including obesity. We discussed intensive lifestyle modifications today with an emphasis on specific weight loss instructions and strategies.   Repetitive spaced learning was employed today to elicit superior memory formation and behavioral change.  4. Class 2 severe obesity with serious comorbidity and body mass index (BMI) of 38.0 to 38.9 in adult, unspecified obesity type (HCC) Deborah Chaney is currently in the action stage of change. As such, her goal is to continue with weight loss efforts. She has agreed to the Category 3 Plan.   Exercise goals: No exercise has been prescribed at this time.  Behavioral modification strategies: increasing lean protein intake and meal planning and cooking strategies.  Deborah Chaney has agreed to follow-up with our clinic in 2 weeks. She was informed of the importance of frequent follow-up visits to maximize her success with intensive lifestyle modifications for her multiple health conditions.   Objective:   Blood pressure 110/74, pulse 94, temperature 97.9 F (36.6 C), height 5\' 4"  (1.626 m), SpO2 97 %. Body mass index is 38.45 kg/m.  General: Cooperative, alert, well developed, in no acute distress. HEENT: Conjunctivae and lids unremarkable. Cardiovascular: Regular rhythm.  Lungs: Normal work of breathing. Neurologic: No focal deficits.   Lab Results  Component Value Date   CREATININE 0.94 08/24/2020   BUN 14 08/24/2020   NA 140 08/24/2020   K 4.5 08/24/2020   CL 102 08/24/2020   CO2 22  08/24/2020   Lab Results  Component Value Date   ALT 15 08/24/2020   AST 12 08/24/2020   ALKPHOS 96 08/24/2020   BILITOT 0.6 08/24/2020   Lab Results  Component Value Date   HGBA1C 5.1 07/06/2020   Lab Results  Component Value Date   INSULIN 15.4 08/24/2020   No results found for: TSH No results found for: CHOL, HDL,  LDLCALC, LDLDIRECT, TRIG, CHOLHDL Lab Results  Component Value Date   WBC 7.5 08/24/2020   HGB 14.3 08/24/2020   HCT 42.4 08/24/2020   MCV 89 08/24/2020   PLT 330 08/24/2020    Attestation Statements:   Reviewed by clinician on day of visit: allergies, medications, problem list, medical history, surgical history, family history, social history, and previous encounter notes.   Edmund Hilda, am acting as transcriptionist for Reuben Likes, MD.   I have reviewed the above documentation for accuracy and completeness, and I agree with the above. - Katherina Mires, MD

## 2020-10-11 ENCOUNTER — Ambulatory Visit (INDEPENDENT_AMBULATORY_CARE_PROVIDER_SITE_OTHER): Payer: BC Managed Care – PPO | Admitting: Family Medicine

## 2020-10-11 ENCOUNTER — Encounter (INDEPENDENT_AMBULATORY_CARE_PROVIDER_SITE_OTHER): Payer: Self-pay | Admitting: Family Medicine

## 2020-10-11 ENCOUNTER — Other Ambulatory Visit: Payer: Self-pay

## 2020-10-11 VITALS — BP 129/84 | HR 84 | Temp 97.8°F | Ht 64.0 in | Wt 222.0 lb

## 2020-10-11 DIAGNOSIS — Z6838 Body mass index (BMI) 38.0-38.9, adult: Secondary | ICD-10-CM

## 2020-10-11 DIAGNOSIS — E559 Vitamin D deficiency, unspecified: Secondary | ICD-10-CM

## 2020-10-11 DIAGNOSIS — I10 Essential (primary) hypertension: Secondary | ICD-10-CM | POA: Diagnosis not present

## 2020-10-11 DIAGNOSIS — Z9189 Other specified personal risk factors, not elsewhere classified: Secondary | ICD-10-CM | POA: Diagnosis not present

## 2020-10-11 MED ORDER — AMLODIPINE BESYLATE 5 MG PO TABS: 5.0000 mg | ORAL_TABLET | Freq: Every day | ORAL | 0 refills | Status: DC

## 2020-10-12 NOTE — Progress Notes (Signed)
Chief Complaint:   OBESITY Deborah Chaney is here to discuss her progress with her obesity treatment plan along with follow-up of her obesity related diagnoses. Deborah Chaney is on the Category 3 Plan and states she is following her eating plan approximately 75% of the time. Deborah Chaney states she is Deborah currently exercising.  Today's visit was #: 4 Starting weight: 223 lbs Starting date: 08/24/2020 Today's weight: 222 lbs Today's date: 10/11/2020 Total lbs lost to date: 1 Total lbs lost since last in-office visit: 0  Interim History: Deborah Chaney has a few more difficult days in terms of scheduling and partook in Deborah celebrations. She has no plans for the next few weeks. She is traveling in May to [place] and will likely be eating out for every meal. Pt has plans to stock up on groceries today.  Subjective:   1. Essential hypertension Deborah Chaney's BP is well controlled. Pt denies chest pain, chest pressure and headache. She is on amlodipine, which was decreased to 5 mg at last appointment).  2. Vitamin D deficiency Deborah Chaney denies nausea, vomiting, and muscle weakness but notes fatigue. Pt is on prescription Vit D.  3. At risk for heart disease Deborah Chaney is at a higher than average risk for cardiovascular disease due to obesity.   Assessment/Plan:   1. Essential hypertension Deborah Chaney is working on healthy weight loss and exercise to improve blood pressure control. We will watch for signs of hypotension as she continues her lifestyle modifications.   - amLODipine (NORVASC) 5 MG tablet; Take 1 tablet (5 mg total) by mouth daily.  Dispense: 30 tablet; Refill: 0  2. Vitamin D deficiency Low Vitamin D level contributes to fatigue and are associated with obesity, breast, and colon cancer. She agrees to continue to take prescription Vitamin D @50 ,000 IU every week and will follow-up for routine testing of Vitamin D, at least 2-3 times per year to avoid over-replacement.  3. At risk for heart disease Aut[place] was given  approximately 15 minutes of coronary artery disease prevention counseling today. She is 55 y.o. female and has risk factors for heart disease including obesity. We discussed intensive lifestyle modifications today with an emphasis on specific weight loss instructions and strategies.   Repetitive spaced learning was employed today to elicit superior memory formation and behavioral change.  4. Class 2 severe obesity with serious comorbidity and body mass index (BMI) of 38.0 to 38.9 in adult, unspecified obesity type (HCC) Deborah Chaney is currently in the action stage of change. As such, her goal is to continue with weight loss efforts. She has agreed to the Category 3 Plan.   Exercise goals: All adults should avoid inactivity. Some physical activity is better than none, and adults who participate in any amount of physical activity gain some health benefits.  Behavioral modification strategies: increasing lean protein intake, meal planning and cooking strategies, keeping healthy foods in the home and planning for success.  Deborah Chaney has agreed to follow-up with our clinic in 3 weeks. She was informed of the importance of frequent follow-up visits to maximize her success with intensive lifestyle modifications for her multiple health conditions.   Objective:   Blood pressure 129/84, pulse 84, temperature 97.8 F (36.6 C), height 5\' 4"  (1.626 m), weight 222 lb (100.7 kg), SpO2 99 %. Body mass index is 38.11 kg/m.  General: Cooperative, alert, well developed, in no acute distress. HEENT: Conjunctivae and lids unremarkable. Cardiovascular: Regular rhythm.  Lungs: Normal work of breathing. Neurologic: No focal deficits.   Lab Results  Component Value Date   CREATININE 0.94 08/24/2020   BUN 14 08/24/2020   NA 140 08/24/2020   K 4.5 08/24/2020   CL 102 08/24/2020   CO2 22 08/24/2020   Lab Results  Component Value Date   ALT 15 08/24/2020   AST 12 08/24/2020   ALKPHOS 96 08/24/2020   BILITOT 0.6  08/24/2020   Lab Results  Component Value Date   HGBA1C 5.1 07/06/2020   Lab Results  Component Value Date   INSULIN 15.4 08/24/2020   No results found for: TSH No results found for: CHOL, HDL, LDLCALC, LDLDIRECT, TRIG, CHOLHDL Lab Results  Component Value Date   WBC 7.5 08/24/2020   HGB 14.3 08/24/2020   HCT 42.4 08/24/2020   MCV 89 08/24/2020   PLT 330 08/24/2020     Attestation Statements:   Reviewed by clinician on day of visit: allergies, medications, problem list, medical history, surgical history, family history, social history, and previous encounter notes.  Edmund Hilda, am acting as transcriptionist for Reuben Likes, MD.   I have reviewed the above documentation for accuracy and completeness, and I agree with the above. - Katherina Mires, MD

## 2020-10-25 ENCOUNTER — Other Ambulatory Visit (INDEPENDENT_AMBULATORY_CARE_PROVIDER_SITE_OTHER): Payer: Self-pay | Admitting: Family Medicine

## 2020-10-25 DIAGNOSIS — I10 Essential (primary) hypertension: Secondary | ICD-10-CM

## 2020-11-07 ENCOUNTER — Ambulatory Visit (INDEPENDENT_AMBULATORY_CARE_PROVIDER_SITE_OTHER): Payer: BC Managed Care – PPO | Admitting: Family Medicine

## 2020-11-09 ENCOUNTER — Other Ambulatory Visit: Payer: Self-pay | Admitting: Family Medicine

## 2020-11-16 ENCOUNTER — Ambulatory Visit (INDEPENDENT_AMBULATORY_CARE_PROVIDER_SITE_OTHER): Payer: BC Managed Care – PPO | Admitting: Family Medicine

## 2020-11-16 ENCOUNTER — Encounter (INDEPENDENT_AMBULATORY_CARE_PROVIDER_SITE_OTHER): Payer: Self-pay | Admitting: Family Medicine

## 2020-11-16 ENCOUNTER — Other Ambulatory Visit: Payer: Self-pay

## 2020-11-16 VITALS — BP 115/70 | HR 76 | Temp 98.3°F | Ht 64.0 in | Wt 224.0 lb

## 2020-11-16 DIAGNOSIS — Z9189 Other specified personal risk factors, not elsewhere classified: Secondary | ICD-10-CM

## 2020-11-16 DIAGNOSIS — I10 Essential (primary) hypertension: Secondary | ICD-10-CM

## 2020-11-16 DIAGNOSIS — E8881 Metabolic syndrome: Secondary | ICD-10-CM | POA: Diagnosis not present

## 2020-11-16 DIAGNOSIS — Z6838 Body mass index (BMI) 38.0-38.9, adult: Secondary | ICD-10-CM

## 2020-11-16 MED ORDER — AMLODIPINE BESYLATE 5 MG PO TABS: 5.0000 mg | ORAL_TABLET | Freq: Every day | ORAL | 0 refills | Status: DC

## 2020-11-22 NOTE — Progress Notes (Signed)
Chief Complaint:   OBESITY Deborah Chaney is here to discuss her progress with her obesity treatment plan along with follow-up of her obesity related diagnoses. Deborah Chaney is on the Category 3 Plan and states she is following her eating plan approximately 50% of the time. Deborah Chaney states she is walking 15 minutes 2 times per week (this week only).  Today's visit was #: 5 Starting weight: 223 lbs Starting date: 08/24/2020 Today's weight: 224 lbs Today's date: 11/16/2020 Total lbs lost to date: 0 Total lbs lost since last in-office visit: 0  Interim History: Deborah Chaney had quite a bit of travel and stress over the last few weeks. The next few weeks are slightly more calm with the exception of stress at work. Pt is not sure what to do to make meal plan easier. Pt's work has been particularly challenging.  Subjective:   1. Essential hypertension Deborah Chaney's BP is controlled today. Pt denies chest pain/chest pressure. She reports infrequent headaches.  2. Insulin resistance Deborah Chaney has some occasional propensity for carbs. She is not on medication.   3. At risk for heart disease Deborah Chaney is at a higher than average risk for cardiovascular disease due to obesity.   Assessment/Plan:   1. Essential hypertension Deborah Chaney is working on healthy weight loss and exercise to improve blood pressure control. We will watch for signs of hypotension as she continues her lifestyle modifications. -Follow up BP at next appt. - amLODipine (NORVASC) 5 MG tablet; Take 1 tablet (5 mg total) by mouth daily.  Dispense: 90 tablet; Refill: 0  2. Insulin resistance Deborah Chaney will continue to work on weight loss, exercise, and decreasing simple carbohydrates to help decrease the risk of diabetes. Deborah Chaney agreed to follow-up with [place] as directed to closely monitor her progress. -Follow up labs in 2 months.  3. At risk for heart disease Deborah Chaney was given approximately 15 minutes of coronary artery disease prevention counseling today. She is 55 y.o.  female and has risk factors for heart disease including obesity. We discussed intensive lifestyle modifications today with an emphasis on specific weight loss instructions and strategies.   Repetitive spaced learning was employed today to elicit superior memory formation and behavioral change.  4. Class 2 severe obesity with serious comorbidity and body mass index (BMI) of 38.0 to 38.9 in adult, unspecified obesity type (HCC) Deborah Chaney is currently in the action stage of change. As such, her goal is to continue with weight loss efforts. She has agreed to the Category 3 Plan.   Exercise goals: All adults should avoid inactivity. Some physical activity is better than none, and adults who participate in any amount of physical activity gain some health benefits.  Behavioral modification strategies: increasing lean protein intake, meal planning and cooking strategies, keeping healthy foods in the home and planning for success.  Deborah Chaney has agreed to follow-up with our clinic in 3 weeks. She was informed of the importance of frequent follow-up visits to maximize her success with intensive lifestyle modifications for her multiple health conditions.   Objective:   Blood pressure 115/70, pulse 76, temperature 98.3 F (36.8 C), height 5\' 4"  (1.626 m), weight 224 lb (101.6 kg), SpO2 98 %. Body mass index is 38.45 kg/m.  General: Cooperative, alert, well developed, in no acute distress. HEENT: Conjunctivae and lids unremarkable. Cardiovascular: Regular rhythm.  Lungs: Normal work of breathing. Neurologic: No focal deficits.   Lab Results  Component Value Date   CREATININE 0.94 08/24/2020   BUN 14 08/24/2020   NA  140 08/24/2020   K 4.5 08/24/2020   CL 102 08/24/2020   CO2 22 08/24/2020   Lab Results  Component Value Date   ALT 15 08/24/2020   AST 12 08/24/2020   ALKPHOS 96 08/24/2020   BILITOT 0.6 08/24/2020   Lab Results  Component Value Date   HGBA1C 5.1 07/06/2020   Lab Results   Component Value Date   INSULIN 15.4 08/24/2020   No results found for: TSH No results found for: CHOL, HDL, LDLCALC, LDLDIRECT, TRIG, CHOLHDL Lab Results  Component Value Date   WBC 7.5 08/24/2020   HGB 14.3 08/24/2020   HCT 42.4 08/24/2020   MCV 89 08/24/2020   PLT 330 08/24/2020     Attestation Statements:   Reviewed by clinician on day of visit: allergies, medications, problem list, medical history, surgical history, family history, social history, and previous encounter notes.  Edmund Hilda, CMA, am acting as transcriptionist for Reuben Likes, MD.   I have reviewed the above documentation for accuracy and completeness, and I agree with the above. - Katherina Mires, MD

## 2020-12-13 ENCOUNTER — Ambulatory Visit (INDEPENDENT_AMBULATORY_CARE_PROVIDER_SITE_OTHER): Payer: BC Managed Care – PPO | Admitting: Family Medicine

## 2020-12-13 ENCOUNTER — Encounter (INDEPENDENT_AMBULATORY_CARE_PROVIDER_SITE_OTHER): Payer: Self-pay | Admitting: Family Medicine

## 2020-12-13 ENCOUNTER — Other Ambulatory Visit: Payer: Self-pay

## 2020-12-13 VITALS — BP 112/80 | HR 74 | Temp 98.3°F | Ht 64.0 in | Wt 225.0 lb

## 2020-12-13 DIAGNOSIS — I10 Essential (primary) hypertension: Secondary | ICD-10-CM

## 2020-12-13 DIAGNOSIS — Z6838 Body mass index (BMI) 38.0-38.9, adult: Secondary | ICD-10-CM

## 2020-12-13 DIAGNOSIS — Z9189 Other specified personal risk factors, not elsewhere classified: Secondary | ICD-10-CM | POA: Diagnosis not present

## 2020-12-13 DIAGNOSIS — E559 Vitamin D deficiency, unspecified: Secondary | ICD-10-CM | POA: Diagnosis not present

## 2020-12-13 MED ORDER — AMLODIPINE BESYLATE 5 MG PO TABS: 2.5000 mg | ORAL_TABLET | Freq: Every day | ORAL | 0 refills | Status: DC

## 2020-12-14 NOTE — Progress Notes (Signed)
Chief Complaint:   OBESITY Deborah Chaney is here to discuss her progress with her obesity treatment plan along with follow-up of her obesity related diagnoses. Deborah Chaney is on the Category 3 Plan and states she is following her eating plan approximately 30-40% of the time. Deborah Chaney states she is walking 20 minutes 2 times per week.  Today's visit was #: 6 Starting weight: 223 lbs Starting date: 08/24/2020 Today's weight: 225 lbs Today's date: 12/13/2020 Total lbs lost to date: 0 Total lbs lost since last in-office visit: 0  Interim History: Deborah Chaney had a very busy few weeks- work has been very intense 12-16 hour days. She drove to [place] for Father's Day and has had to drive for work more frequently. She is hopeful the next few weeks will involve less travel. She want's to get back on track on her meal plan.  Subjective:   1. Essential hypertension Deborah Chaney's BP is controlled today. She denies chest pain/chest pressure/headache.  2. Vitamin D deficiency Deborah Chaney denies nausea, vomiting, and muscle weakness but notes fatigue. She is on prescription Vit D.  3. At risk for deficient intake of food Deborah Chaney is at risk for deficient intake of food due to work schedule.  Assessment/Plan:   1. Essential hypertension Deborah Chaney is working on healthy weight loss and exercise to improve blood pressure control. We will watch for signs of hypotension as she continues her lifestyle modifications. Continue amlodipine and decrease to 2.5 mg - amLODipine (NORVASC) 5 MG tablet; Take 0.5 tablets (2.5 mg total) by mouth daily.  Dispense: 90 tablet; Refill: 0  2. Vitamin D deficiency Low Vitamin D level contributes to fatigue and are associated with obesity, breast, and colon cancer. She agrees to continue to take prescription Vitamin D @50 ,000 IU every week and will follow-up for routine testing of Vitamin D, at least 2-3 times per year to avoid over-replacement.  3. At risk for deficient intake of food Deborah Chaney was given  approximately 15 minutes of deficit intake of food prevention counseling today. Deborah Chaney is at risk for eating too few calories based on current food recall. She was encouraged to focus on meeting caloric and protein goals according to her recommended meal plan.    4. Class 2 severe obesity with serious comorbidity and body mass index (BMI) of 38.0 to 38.9 in adult, unspecified obesity type (HCC)  Deborah Chaney is currently in the action stage of change. As such, her goal is to continue with weight loss efforts. She has agreed to the Category 3 Plan.   Exercise goals: All adults should avoid inactivity. Some physical activity is better than none, and adults who participate in any amount of physical activity gain some health benefits.  Behavioral modification strategies: increasing lean protein intake, meal planning and cooking strategies, keeping healthy foods in the home, and planning for success.  Deborah Chaney has agreed to follow-up with our clinic in 3 weeks. She was informed of the importance of frequent follow-up visits to maximize her success with intensive lifestyle modifications for her multiple health conditions.   Objective:   Blood pressure 112/80, pulse 74, temperature 98.3 F (36.8 C), height 5\' 4"  (1.626 m), weight 225 lb (102.1 kg), SpO2 94 %. Body mass index is 38.62 kg/m.  General: Cooperative, alert, well developed, in no acute distress. HEENT: Conjunctivae and lids unremarkable. Cardiovascular: Regular rhythm.  Lungs: Normal work of breathing. Neurologic: No focal deficits.   Lab Results  Component Value Date   CREATININE 0.94 08/24/2020   BUN 14  08/24/2020   NA 140 08/24/2020   K 4.5 08/24/2020   CL 102 08/24/2020   CO2 22 08/24/2020   Lab Results  Component Value Date   ALT 15 08/24/2020   AST 12 08/24/2020   ALKPHOS 96 08/24/2020   BILITOT 0.6 08/24/2020   Lab Results  Component Value Date   HGBA1C 5.1 07/06/2020   Lab Results  Component Value Date   INSULIN 15.4  08/24/2020   No results found for: TSH No results found for: CHOL, HDL, LDLCALC, LDLDIRECT, TRIG, CHOLHDL Lab Results  Component Value Date   WBC 7.5 08/24/2020   HGB 14.3 08/24/2020   HCT 42.4 08/24/2020   MCV 89 08/24/2020   PLT 330 08/24/2020   No results found for: IRON, TIBC, FERRITIN   Attestation Statements:   Reviewed by clinician on day of visit: allergies, medications, problem list, medical history, surgical history, family history, social history, and previous encounter notes.  Edmund Hilda, CMA, am acting as transcriptionist for Reuben Likes, MD.   I have reviewed the above documentation for accuracy and completeness, and I agree with the above. - Katherina Mires, MD

## 2021-01-03 ENCOUNTER — Ambulatory Visit (INDEPENDENT_AMBULATORY_CARE_PROVIDER_SITE_OTHER): Payer: BC Managed Care – PPO | Admitting: Family Medicine

## 2021-01-03 ENCOUNTER — Encounter (INDEPENDENT_AMBULATORY_CARE_PROVIDER_SITE_OTHER): Payer: Self-pay | Admitting: Family Medicine

## 2021-01-03 ENCOUNTER — Other Ambulatory Visit: Payer: Self-pay

## 2021-01-03 VITALS — BP 122/84 | HR 74 | Temp 98.5°F | Ht 64.0 in | Wt 227.0 lb

## 2021-01-03 DIAGNOSIS — I10 Essential (primary) hypertension: Secondary | ICD-10-CM

## 2021-01-03 DIAGNOSIS — Z6838 Body mass index (BMI) 38.0-38.9, adult: Secondary | ICD-10-CM | POA: Diagnosis not present

## 2021-01-09 NOTE — Progress Notes (Signed)
Chief Complaint:   OBESITY Deborah Chaney is here to discuss her progress with her obesity treatment plan along with follow-up of her obesity related diagnoses. Deborah Chaney is on the Category 3 Plan and states she is following her eating plan approximately 75% of the time. Deborah Chaney states she is walking 15-20 minutes 2 times per week.  Today's visit was #: 7 Starting weight: 223 lbs Starting date: 08/24/2020 Today's weight: 227 lbs Today's date: 01/03/2021 Total lbs lost to date: 0 Total lbs lost since last in-office visit: 0  Interim History: Deborah Chaney felt she did better on the meal plan. Breakfast and lunch are more on plan. Dinner is still an obstacle. She is doing a healthy choice meal or [place] sandwich. She is interested in incorporating carbs at dinner. She is doing [employer] and laughing cow, apples, and string cheese. She may go out of town this weekend.  Subjective:   1. Essential hypertension Deborah Chaney's BP controlled today. Pt denies cardiac symptoms. She is monitoring her BP at home. Her highest systolic BP is 134.  Assessment/Plan:   1. Essential hypertension Deborah Chaney is working on healthy weight loss and exercise to improve blood pressure control. We will watch for signs of hypotension as she continues her lifestyle modifications. Stop amlodipine and follow up with BP at next appt.  2. Obesity BMI 39.1  Deborah Chaney is currently in the action stage of change. As such, her goal is to continue with weight loss efforts. She has agreed to the Category 3 Plan.   Exercise goals: No exercise has been prescribed at this time. Increasing walking to 3 times week.  Behavioral modification strategies: increasing lean protein intake, meal planning and cooking strategies, keeping healthy foods in the home, and planning for success.  Deborah Chaney has agreed to follow-up with our clinic in 2-3 weeks. She was informed of the importance of frequent follow-up visits to maximize her success with intensive lifestyle  modifications for her multiple health conditions.   Objective:   Blood pressure 122/84, pulse 74, temperature 98.5 F (36.9 C), height 5\' 4"  (1.626 m), weight 227 lb (103 kg), SpO2 96 %. Body mass index is 38.96 kg/m.  General: Cooperative, alert, well developed, in no acute distress. HEENT: Conjunctivae and lids unremarkable. Cardiovascular: Regular rhythm.  Lungs: Normal work of breathing. Neurologic: No focal deficits.   Lab Results  Component Value Date   CREATININE 0.94 08/24/2020   BUN 14 08/24/2020   NA 140 08/24/2020   K 4.5 08/24/2020   CL 102 08/24/2020   CO2 22 08/24/2020   Lab Results  Component Value Date   ALT 15 08/24/2020   AST 12 08/24/2020   ALKPHOS 96 08/24/2020   BILITOT 0.6 08/24/2020   Lab Results  Component Value Date   HGBA1C 5.1 07/06/2020   Lab Results  Component Value Date   INSULIN 15.4 08/24/2020   No results found for: TSH No results found for: CHOL, HDL, LDLCALC, LDLDIRECT, TRIG, CHOLHDL No results found for: 10/24/2020 Lab Results  Component Value Date   WBC 7.5 08/24/2020   HGB 14.3 08/24/2020   HCT 42.4 08/24/2020   MCV 89 08/24/2020   PLT 330 08/24/2020    Attestation Statements:   Reviewed by clinician on day of visit: allergies, medications, problem list, medical history, surgical history, family history, social history, and previous encounter notes.  Time spent on visit including pre-visit chart review and post-visit care and charting was 13 minutes.   10/24/2020, CMA, am acting  as transcriptionist for Reuben Likes, MD.   I have reviewed the above documentation for accuracy and completeness, and I agree with the above. - Reuben Likes, MD

## 2021-01-24 ENCOUNTER — Encounter (INDEPENDENT_AMBULATORY_CARE_PROVIDER_SITE_OTHER): Payer: Self-pay | Admitting: Family Medicine

## 2021-01-24 ENCOUNTER — Ambulatory Visit (INDEPENDENT_AMBULATORY_CARE_PROVIDER_SITE_OTHER): Payer: BC Managed Care – PPO | Admitting: Family Medicine

## 2021-01-24 ENCOUNTER — Other Ambulatory Visit: Payer: Self-pay

## 2021-01-24 VITALS — BP 125/80 | HR 73 | Temp 97.9°F | Ht 64.0 in | Wt 230.0 lb

## 2021-01-24 DIAGNOSIS — E559 Vitamin D deficiency, unspecified: Secondary | ICD-10-CM

## 2021-01-24 DIAGNOSIS — Z6838 Body mass index (BMI) 38.0-38.9, adult: Secondary | ICD-10-CM | POA: Diagnosis not present

## 2021-01-24 DIAGNOSIS — E8881 Metabolic syndrome: Secondary | ICD-10-CM | POA: Diagnosis not present

## 2021-01-24 DIAGNOSIS — Z9189 Other specified personal risk factors, not elsewhere classified: Secondary | ICD-10-CM

## 2021-01-25 LAB — COMPREHENSIVE METABOLIC PANEL
ALT: 12 IU/L (ref 0–32)
AST: 15 IU/L (ref 0–40)
Albumin/Globulin Ratio: 1.5 (ref 1.2–2.2)
Albumin: 4.3 g/dL (ref 3.8–4.9)
Alkaline Phosphatase: 96 IU/L (ref 44–121)
BUN/Creatinine Ratio: 14 (ref 9–23)
BUN: 14 mg/dL (ref 6–24)
Bilirubin Total: 0.6 mg/dL (ref 0.0–1.2)
CO2: 23 mmol/L (ref 20–29)
Calcium: 9.3 mg/dL (ref 8.7–10.2)
Chloride: 103 mmol/L (ref 96–106)
Creatinine, Ser: 0.99 mg/dL (ref 0.57–1.00)
Globulin, Total: 2.9 g/dL (ref 1.5–4.5)
Glucose: 85 mg/dL (ref 65–99)
Potassium: 4.8 mmol/L (ref 3.5–5.2)
Sodium: 141 mmol/L (ref 134–144)
Total Protein: 7.2 g/dL (ref 6.0–8.5)
eGFR: 67 mL/min/{1.73_m2} (ref >59)

## 2021-01-25 LAB — VITAMIN D 25 HYDROXY (VIT D DEFICIENCY, FRACTURES): Vit D, 25-Hydroxy: 54.6 ng/mL (ref 30.0–100.0)

## 2021-01-25 LAB — HEMOGLOBIN A1C
Est. average glucose Bld gHb Est-mCnc: 97 mg/dL
Hgb A1c MFr Bld: 5 % (ref 4.8–5.6)

## 2021-01-25 LAB — INSULIN, RANDOM: INSULIN: 15.7 u[IU]/mL (ref 2.6–24.9)

## 2021-01-25 NOTE — Progress Notes (Signed)
Chief Complaint:   OBESITY Deborah Chaney is here to discuss her progress with her obesity treatment plan along with follow-up of her obesity related diagnoses. Deborah Chaney is on the Category 3 Plan and states she is following her eating plan approximately 85-90% of the time. Deborah Chaney states she is walking, elliptical, and recumbent bike 20 minutes 3 times per week.  Today's visit was #: 8 Starting weight: 223 lbs Starting date: 08/24/2020 Today's weight: 230 lbs Today's date: 01/24/2021 Total lbs lost to date: 0 Total lbs lost since last in-office visit: 0  Interim History: Deborah Chaney was eating on plan 85% of the time. Breakfast- 2 eggs with 1 slice bread +/- chicken bacon; Lunch is steamer bowl; Dinner is fish or chicken 8-10 oz and vegetable; Snacks- laughing cow with thin saltines and fruit (realized fruit is part of lunch). She has an anniversary trip to [place] on 14th of August for 1 week.  Subjective:   1. Vitamin D deficiency Deborah Chaney denies nausea, vomiting, and muscle weakness but notes fatigue.   2. Insulin resistance Deborah Chaney's last A1c was 5.1 and insulin level 15.4. She is not on medication.  3. At risk for osteoporosis Deborah Chaney is at higher risk of osteopenia and osteoporosis due to Vitamin D deficiency.   Assessment/Plan:   1. Vitamin D deficiency Low Vitamin D level contributes to fatigue and are associated with obesity, breast, and colon cancer. She agrees to continue to take prescription Vitamin D @50 ,000 IU every week and will follow-up for routine testing of Vitamin D, at least 2-3 times per year to avoid over-replacement. Check labs today.  - VITAMIN D 25 Hydroxy (Vit-D Deficiency, Fractures)  2. Insulin resistance Deborah Chaney will continue to work on weight loss, exercise, and decreasing simple carbohydrates to help decrease the risk of diabetes. Deborah Chaney agreed to follow-up with Deborah Chaney as directed to closely monitor her progress. Check labs today.  - Comprehensive metabolic panel -  Hemoglobin A1c - Insulin, random  3. At risk for osteoporosis Deborah Chaney was given approximately 15 minutes of osteoporosis prevention counseling today. Deborah Chaney is at risk for osteopenia and osteoporosis due to her Vitamin D deficiency. She was encouraged to take her Vitamin D and follow her higher calcium diet and increase strengthening exercise to help strengthen her bones and decrease her risk of osteopenia and osteoporosis.  Repetitive spaced learning was employed today to elicit superior memory formation and behavioral change.  4. Obesity with current BMI of 39.5  Deborah Chaney is currently in the action stage of change. As such, her goal is to continue with weight loss efforts. She has agreed to the Category 3 Plan.   Exercise goals:  As is  Behavioral modification strategies: increasing lean protein intake, meal planning and cooking strategies, keeping healthy foods in the home, and planning for success.  Deborah Chaney has agreed to follow-up with our clinic in 3 weeks. She was informed of the importance of frequent follow-up visits to maximize her success with intensive lifestyle modifications for her multiple health conditions.   Deborah Chaney was informed we would discuss her lab results at her next visit unless there is a critical issue that needs to be addressed sooner. Deborah Chaney agreed to keep her next visit at the agreed upon time to discuss these results.  Objective:   Blood pressure 125/80, pulse 73, temperature 97.9 F (36.6 C), height 5\' 4"  (1.626 m), weight 230 lb (104.3 kg), SpO2 98 %. Body mass index is 39.48 kg/m.  General: Cooperative, alert, well developed, in  no acute distress. HEENT: Conjunctivae and lids unremarkable. Cardiovascular: Regular rhythm.  Lungs: Normal work of breathing. Neurologic: No focal deficits.   Lab Results  Component Value Date   CREATININE 0.99 01/24/2021   BUN 14 01/24/2021   NA 141 01/24/2021   K 4.8 01/24/2021   CL 103 01/24/2021   CO2 23 01/24/2021   Lab  Results  Component Value Date   ALT 12 01/24/2021   AST 15 01/24/2021   ALKPHOS 96 01/24/2021   BILITOT 0.6 01/24/2021   Lab Results  Component Value Date   HGBA1C 5.0 01/24/2021   HGBA1C 5.1 07/06/2020   Lab Results  Component Value Date   INSULIN 15.7 01/24/2021   INSULIN 15.4 08/24/2020   No results found for: TSH No results found for: CHOL, HDL, LDLCALC, LDLDIRECT, TRIG, CHOLHDL Lab Results  Component Value Date   VD25OH 54.6 01/24/2021   Lab Results  Component Value Date   WBC 7.5 08/24/2020   HGB 14.3 08/24/2020   HCT 42.4 08/24/2020   MCV 89 08/24/2020   PLT 330 08/24/2020    Attestation Statements:   Reviewed by clinician on day of visit: allergies, medications, problem list, medical history, surgical history, family history, social history, and previous encounter notes.  Edmund Hilda, CMA, am acting as transcriptionist for Reuben Likes, MD.   I have reviewed the above documentation for accuracy and completeness, and I agree with the above. - Reuben Likes, MD

## 2021-02-28 ENCOUNTER — Ambulatory Visit (INDEPENDENT_AMBULATORY_CARE_PROVIDER_SITE_OTHER): Payer: BC Managed Care – PPO | Admitting: Family Medicine

## 2021-02-28 ENCOUNTER — Encounter (INDEPENDENT_AMBULATORY_CARE_PROVIDER_SITE_OTHER): Payer: Self-pay | Admitting: Family Medicine

## 2021-02-28 ENCOUNTER — Other Ambulatory Visit: Payer: Self-pay

## 2021-02-28 VITALS — BP 104/69 | HR 77 | Temp 98.2°F | Ht 64.0 in | Wt 229.0 lb

## 2021-02-28 DIAGNOSIS — I10 Essential (primary) hypertension: Secondary | ICD-10-CM

## 2021-02-28 DIAGNOSIS — Z9189 Other specified personal risk factors, not elsewhere classified: Secondary | ICD-10-CM | POA: Diagnosis not present

## 2021-02-28 DIAGNOSIS — R632 Polyphagia: Secondary | ICD-10-CM | POA: Diagnosis not present

## 2021-02-28 DIAGNOSIS — E559 Vitamin D deficiency, unspecified: Secondary | ICD-10-CM

## 2021-02-28 DIAGNOSIS — Z6838 Body mass index (BMI) 38.0-38.9, adult: Secondary | ICD-10-CM

## 2021-02-28 MED ORDER — PLENITY WELCOME KIT PO CAPS: 3.0000 | ORAL_CAPSULE | Freq: Two times a day (BID) | ORAL | 0 refills | Status: DC

## 2021-02-28 NOTE — Progress Notes (Signed)
Chief Complaint:   OBESITY Deborah Chaney is here to discuss her progress with her obesity treatment plan along with follow-up of her obesity related diagnoses. Deborah Chaney is on the Category 3 Plan and states she is following her eating plan approximately 75% of the time. Deborah Chaney states she is walking 5-6 miles 5 times per week.  Today's visit was #: 9 Starting weight: 223 lbs Starting date: 08/24/2020 Today's weight: 229 lbs Today's date: 02/28/2021 Total lbs lost to date: 0 Total lbs lost since last in-office visit: 1  Interim History: Deborah Chaney had an episode of subconjunctival hemorrhage. She restarted BP medication due to elevated BP readings. Pt has 2 upcoming weddings in the next few weeks. She is getting all food in with no hunger.  Subjective:   1. Polyphagia Fanta looked into phentermine/topiramate, Wellbutrin, and Plenity. No comorbidity concerns, such as GERD.  2. Essential hypertension BP low today, but pt has been taking amlodipine. Pt denies chest pain/chest pressure/headache.  3. Vitamin D deficiency Pt denies nausea, vomiting, and muscle weakness but notes fatigue. She is on prescription Vit D. Pt's last Vit D level was 54.6.  4. At risk for side effect of medication Syrenity is at risk for side effects of medication due to starting Plenity.  Assessment/Plan:   1. Polyphagia Deborah Chaney will start Plenity, as prescribed below.Intensive lifestyle modifications are the first line treatment for this issue. We discussed several lifestyle modifications today and she will continue to work on diet, exercise and weight loss efforts. Orders and follow up as documented in patient record.  Counseling Polyphagia is excessive hunger. Causes can include: low blood sugars, hypERthyroidism, PMS, lack of sleep, stress, insulin resistance, diabetes, certain medications, and diets that are deficient in protein and fiber.   Start- Carboxymeth-Cellulose-CitricAc (PLENITY WELCOME KIT) CAPS; Take 3 capsules  by mouth 2 (two) times daily.  Dispense: 180 capsule; Refill: 0  2. Essential hypertension Deborah Chaney is working on healthy weight loss and exercise to improve blood pressure control. We will watch for signs of hypotension as she continues her lifestyle modifications. Follow up BP at next appt.  3. Vitamin D deficiency Low Vitamin D level contributes to fatigue and are associated with obesity, breast, and colon cancer. She stop taking prescription Vitamin D ans start OTC Vit D 5,000 IU per day and will follow-up for routine testing of Vitamin D, at least 2-3 times per year to avoid over-replacement.  4. At risk for side effect of medication Deborah Chaney was given approximately 15 minutes of drug side effect counseling today.  We discussed side effect possibility and risk versus benefits. Deborah Chaney agreed to the medication and will contact this office if these side effects are intolerable.  Repetitive spaced learning was employed today to elicit superior memory formation and behavioral change.  5. Obesity with current BMI of 39.3  Deborah Chaney is currently in the action stage of change. As such, her goal is to continue with weight loss efforts. She has agreed to the Category 3 Plan and keeping a food journal and adhering to recommended goals of 1600-1700 calories and 95+ grams protein when traveling.   Exercise goals: No exercise has been prescribed at this time.  Behavioral modification strategies: increasing lean protein intake, meal planning and cooking strategies, and keeping healthy foods in the home.  Deborah Chaney has agreed to follow-up with our clinic in 3 weeks. She was informed of the importance of frequent follow-up visits to maximize her success with intensive lifestyle modifications for her multiple health  conditions.   Objective:   Blood pressure 104/69, pulse 77, temperature 98.2 F (36.8 C), height $RemoveBe'5\' 4"'[id number]$  (1.626 m), weight 229 lb (103.9 kg), SpO2 94 %. Body mass index is 39.31 kg/m.  General:  Cooperative, alert, well developed, in no acute distress. HEENT: Conjunctivae and lids unremarkable. Cardiovascular: Regular rhythm.  Lungs: Normal work of breathing. Neurologic: No focal deficits.   Lab Results  Component Value Date   CREATININE 0.99 01/24/2021   BUN 14 01/24/2021   NA 141 01/24/2021   K 4.8 01/24/2021   CL 103 01/24/2021   CO2 23 01/24/2021   Lab Results  Component Value Date   ALT 12 01/24/2021   AST 15 01/24/2021   ALKPHOS 96 01/24/2021   BILITOT 0.6 01/24/2021   Lab Results  Component Value Date   HGBA1C 5.0 01/24/2021   HGBA1C 5.1 07/06/2020   Lab Results  Component Value Date   INSULIN 15.7 01/24/2021   INSULIN 15.4 08/24/2020   No results found for: TSH No results found for: CHOL, HDL, LDLCALC, LDLDIRECT, TRIG, CHOLHDL Lab Results  Component Value Date   VD25OH 54.6 01/24/2021   Lab Results  Component Value Date   WBC 7.5 08/24/2020   HGB 14.3 08/24/2020   HCT 42.4 08/24/2020   MCV 89 08/24/2020   PLT 330 08/24/2020   Attestation Statements:   Reviewed by clinician on day of visit: allergies, medications, problem list, medical history, surgical history, family history, social history, and previous encounter notes.  Coral Ceo, CMA, am acting as transcriptionist for Coralie Common, MD.   I have reviewed the above documentation for accuracy and completeness, and I agree with the above. - Coralie Common, MD

## 2021-03-22 ENCOUNTER — Ambulatory Visit (INDEPENDENT_AMBULATORY_CARE_PROVIDER_SITE_OTHER): Payer: BC Managed Care – PPO | Admitting: Family Medicine

## 2021-03-28 ENCOUNTER — Ambulatory Visit (INDEPENDENT_AMBULATORY_CARE_PROVIDER_SITE_OTHER): Payer: BC Managed Care – PPO | Admitting: Family Medicine

## 2021-03-28 ENCOUNTER — Other Ambulatory Visit: Payer: Self-pay

## 2021-03-28 ENCOUNTER — Encounter (INDEPENDENT_AMBULATORY_CARE_PROVIDER_SITE_OTHER): Payer: Self-pay | Admitting: Family Medicine

## 2021-03-28 VITALS — BP 129/76 | HR 89 | Temp 98.3°F | Ht 64.0 in | Wt 232.0 lb

## 2021-03-28 DIAGNOSIS — R632 Polyphagia: Secondary | ICD-10-CM

## 2021-03-28 DIAGNOSIS — Z6838 Body mass index (BMI) 38.0-38.9, adult: Secondary | ICD-10-CM | POA: Diagnosis not present

## 2021-03-28 DIAGNOSIS — E8881 Metabolic syndrome: Secondary | ICD-10-CM | POA: Diagnosis not present

## 2021-03-29 NOTE — Progress Notes (Signed)
Chief Complaint:   OBESITY Deborah Chaney is here to discuss her progress with her obesity treatment plan along with follow-up of her obesity related diagnoses. Deborah Chaney is on the Category 3 Plan and keeping a food journal and adhering to recommended goals of 1600-1700 calories and 95+ grams protein and states she is following her eating plan approximately 75% of the time. Deborah Chaney states she is walking and doing stairs 5-20 minutes 5 times per week.  Today's visit was #: 10 Starting weight: 223 lbs Starting date: 08/24/2020 Today's weight: 232 lbs Today's date: 03/28/2021 Total lbs lost to date: 0 Total lbs lost since last in-office visit: 0  Interim History: Deborah Chaney has been walking more and working more. She has traveled to [place] and [place] and will be traveling to [place], [place]. She has no scheduled travel for the next few weeks. Pt was off calories (more days under than over) and getting all protein in. Pt wants to do more journaling.  Subjective:   1. Insulin resistance Deborah Chaney's last A1c was 5.0 and insulin level 15.7. She is not on medication.  2. Polyphagia Pt couldn't get Plenity at [facility]. Website to contact this office.  Assessment/Plan:   1. Insulin resistance Deborah Chaney will continue to work on weight loss, exercise, and decreasing simple carbohydrates to help decrease the risk of diabetes. Deborah Chaney agreed to follow-up with [place] as directed to closely monitor her progress. Repeat labs in January 2023.  2. Polyphagia Intensive lifestyle modifications are the first line treatment for this issue. We discussed several lifestyle modifications today and she will continue to work on diet, exercise and weight loss efforts. Orders and follow up as documented in patient record. Follow up at next appt to ensure pt got Plenity.  Counseling Polyphagia is excessive hunger. Causes can include: low blood sugars, hypERthyroidism, PMS, lack of sleep, stress, insulin resistance, diabetes, certain medications,  and diets that are deficient in protein and fiber.   3. Obesity with current BMI of 39.9  Deborah Chaney is currently in the action stage of change. As such, her goal is to continue with weight loss efforts. She has agreed to keeping a food journal and adhering to recommended goals of 1600-1700 calories and 95+ grams protein.   Exercise goals:  As is  Behavioral modification strategies: increasing lean protein intake, meal planning and cooking strategies, keeping healthy foods in the home, and planning for success.  Deborah Chaney has agreed to follow-up with our clinic in 3 weeks. She was informed of the importance of frequent follow-up visits to maximize her success with intensive lifestyle modifications for her multiple health conditions.   Objective:   Blood pressure 129/76, pulse 89, temperature 98.3 F (36.8 C), height 5\' 4"  (1.626 m), weight 232 lb (105.2 kg), SpO2 97 %. Body mass index is 39.82 kg/m.  General: Cooperative, alert, well developed, in no acute distress. HEENT: Conjunctivae and lids unremarkable. Cardiovascular: Regular rhythm.  Lungs: Normal work of breathing. Neurologic: No focal deficits.   Lab Results  Component Value Date   CREATININE 0.99 01/24/2021   BUN 14 01/24/2021   NA 141 01/24/2021   K 4.8 01/24/2021   CL 103 01/24/2021   CO2 23 01/24/2021   Lab Results  Component Value Date   ALT 12 01/24/2021   AST 15 01/24/2021   ALKPHOS 96 01/24/2021   BILITOT 0.6 01/24/2021   Lab Results  Component Value Date   HGBA1C 5.0 01/24/2021   HGBA1C 5.1 07/06/2020   Lab Results  Component  Value Date   INSULIN 15.7 01/24/2021   INSULIN 15.4 08/24/2020   No results found for: TSH No results found for: CHOL, HDL, LDLCALC, LDLDIRECT, TRIG, CHOLHDL Lab Results  Component Value Date   VD25OH 54.6 01/24/2021   Lab Results  Component Value Date   WBC 7.5 08/24/2020   HGB 14.3 08/24/2020   HCT 42.4 08/24/2020   MCV 89 08/24/2020   PLT 330 08/24/2020    Attestation  Statements:   Reviewed by clinician on day of visit: allergies, medications, problem list, medical history, surgical history, family history, social history, and previous encounter notes.  Time spent on visit including pre-visit chart review and post-visit care and charting was 13 minutes.   Edmund Hilda, CMA, am acting as transcriptionist for Reuben Likes, MD.  I have reviewed the above documentation for accuracy and completeness, and I agree with the above. - Reuben Likes, MD

## 2021-04-18 ENCOUNTER — Encounter (INDEPENDENT_AMBULATORY_CARE_PROVIDER_SITE_OTHER): Payer: Self-pay | Admitting: Family Medicine

## 2021-04-18 ENCOUNTER — Other Ambulatory Visit: Payer: Self-pay

## 2021-04-18 ENCOUNTER — Ambulatory Visit (INDEPENDENT_AMBULATORY_CARE_PROVIDER_SITE_OTHER): Payer: BC Managed Care – PPO | Admitting: Family Medicine

## 2021-04-18 VITALS — BP 125/74 | HR 81 | Temp 98.1°F | Ht 64.0 in | Wt 234.0 lb

## 2021-04-18 DIAGNOSIS — R632 Polyphagia: Secondary | ICD-10-CM | POA: Diagnosis not present

## 2021-04-18 DIAGNOSIS — E8881 Metabolic syndrome: Secondary | ICD-10-CM | POA: Diagnosis not present

## 2021-04-18 DIAGNOSIS — Z6838 Body mass index (BMI) 38.0-38.9, adult: Secondary | ICD-10-CM

## 2021-04-18 MED ORDER — PLENITY WELCOME KIT PO CAPS: 3.0000 | ORAL_CAPSULE | Freq: Two times a day (BID) | ORAL | 0 refills | Status: DC

## 2021-04-18 NOTE — Progress Notes (Signed)
   Chief Complaint:   OBESITY Deborah Chaney is here to discuss her progress with her obesity treatment plan along with follow-up of her obesity related diagnoses. Deborah Chaney is on keeping a food journal and adhering to recommended goals of 1600-1700 calories and 95+ grams protein and states she is following her eating plan approximately 40% of the time. Deborah Chaney states she is walking 3-5 miles per week.  Today's visit was #: 11 Starting weight: 223 lbs Starting date: 08/24/2020 Today's weight: 234 lbs Today's date: 04/18/2021 Total lbs lost to date: 0 Total lbs lost since last in-office visit: 0  Interim History: Deborah Chaney had lots of work stress and familial stress over the last few weeks. She is recognizing she has eaten indulgently, choosing fast food options frequently. Pt is doing [employer] or [employer] or eating later in the evening. She is planning for homecoming get together.  Subjective:   1. Insulin resistance Deborah Chaney notes some carb cravings. She is not on medication.  2. Polyphagia Pt hasn't started Plenity yet. She is experiencing increased drive to eat.  Assessment/Plan:   1. Insulin resistance Deborah Chaney will continue to work on weight loss, exercise, and decreasing simple carbohydrates to help decrease the risk of diabetes. Deborah Chaney agreed to follow-up with us as directed to closely monitor her progress. Follow up in January 2023.  2. Polyphagia Intensive lifestyle modifications are the first line treatment for this issue. We discussed several lifestyle modifications today and she will continue to work on diet, exercise and weight loss efforts. Orders and follow up as documented in patient record.  Counseling Polyphagia is excessive hunger. Causes can include: low blood sugars, hypERthyroidism, PMS, lack of sleep, stress, insulin resistance, diabetes, certain medications, and diets that are deficient in protein and fiber.   Sent to [facility]- Carboxymeth-Cellulose-CitricAc (PLENITY WELCOME  KIT) CAPS; Take 3 capsules by mouth 2 (two) times daily.  Dispense: 180 capsule; Refill: 0  3. Obesity with current BMI of 40.3  Deborah Chaney is currently in the action stage of change. As such, her goal is to continue with weight loss efforts. She has agreed to keeping a food journal and adhering to recommended goals of 1600-1700 calories and 95+ grams protein.   Plenity Welcome Kit sent to [facility].  Exercise goals: All adults should avoid inactivity. Some physical activity is better than none, and adults who participate in any amount of physical activity gain some health benefits.  Behavioral modification strategies: increasing lean protein intake, meal planning and cooking strategies, planning for success, and keeping a strict food journal.  Deborah Chaney has agreed to follow-up with our clinic in 3 weeks. She was informed of the importance of frequent follow-up visits to maximize her success with intensive lifestyle modifications for her multiple health conditions.   Objective:   Blood pressure 125/74, pulse 81, temperature 98.1 F (36.7 C), height 5' 4" (1.626 m), weight 234 lb (106.1 kg). Body mass index is 40.17 kg/m.  General: Cooperative, alert, well developed, in no acute distress. HEENT: Conjunctivae and lids unremarkable. Cardiovascular: Regular rhythm.  Lungs: Normal work of breathing. Neurologic: No focal deficits.   Lab Results  Component Value Date   CREATININE 0.99 01/24/2021   BUN 14 01/24/2021   NA 141 01/24/2021   K 4.8 01/24/2021   CL 103 01/24/2021   CO2 23 01/24/2021   Lab Results  Component Value Date   ALT 12 01/24/2021   AST 15 01/24/2021   ALKPHOS 96 01/24/2021   BILITOT 0.6 01/24/2021     Lab Results  Component Value Date   HGBA1C 5.0 01/24/2021   HGBA1C 5.1 07/06/2020   Lab Results  Component Value Date   INSULIN 15.7 01/24/2021   INSULIN 15.4 08/24/2020   No results found for: TSH No results found for: CHOL, HDL, LDLCALC, LDLDIRECT, TRIG,  CHOLHDL Lab Results  Component Value Date   VD25OH 54.6 01/24/2021   Lab Results  Component Value Date   WBC 7.5 08/24/2020   HGB 14.3 08/24/2020   HCT 42.4 08/24/2020   MCV 89 08/24/2020   PLT 330 08/24/2020    Attestation Statements:   Reviewed by clinician on day of visit: allergies, medications, problem list, medical history, surgical history, family history, social history, and previous encounter notes.  Time spent on visit including pre-visit chart review and post-visit care and charting was 25 minutes.   Coral Ceo, CMA, am acting as transcriptionist for Coralie Common, MD.   I have reviewed the above documentation for accuracy and completeness, and I agree with the above. - Coralie Common, MD

## 2021-05-16 ENCOUNTER — Ambulatory Visit (INDEPENDENT_AMBULATORY_CARE_PROVIDER_SITE_OTHER): Payer: BC Managed Care – PPO | Admitting: Family Medicine

## 2021-06-06 ENCOUNTER — Ambulatory Visit (INDEPENDENT_AMBULATORY_CARE_PROVIDER_SITE_OTHER): Payer: BC Managed Care – PPO | Admitting: Family Medicine

## 2021-06-14 IMAGING — CT CT ABD-PELV W/ CM
2 of 5 series · 17 of 46 positions shown, 19 images · IV contrast (Omnipaque)
Comparison: None.

CLINICAL DATA: Left lower quadrant abdominal pain

EXAM:
CT ABDOMEN AND PELVIS WITH CONTRAST
TECHNIQUE: Multidetector CT imaging of the abdomen and pelvis was performed
using the standard protocol following bolus administration of
intravenous contrast.
CONTRAST:  100mL OMNIPAQUE IOHEXOL 300 MG/ML  SOLN

[Series 2: axial st · axial · 0.72mm/px · z∈[-380,+35]mm · 14 of 93 slices shown, 16 images]
[im 5/93  soft-tissue]
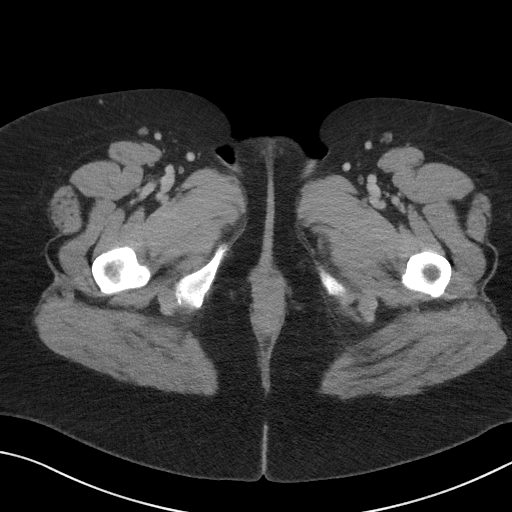
[im 5/93  bone]
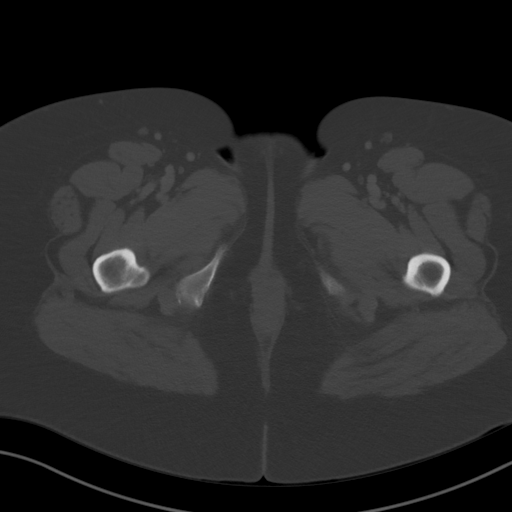
[im 10/93  soft-tissue]
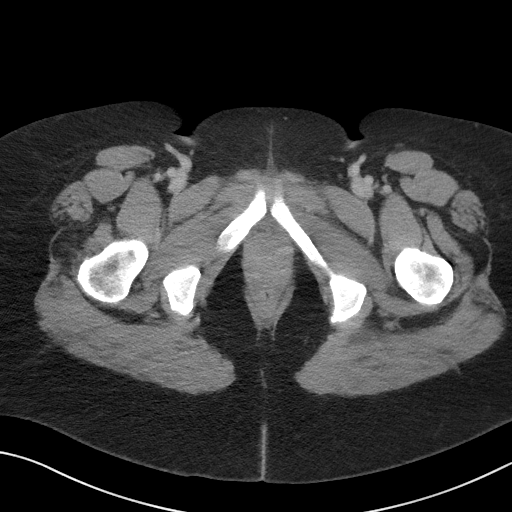
[im 20/93  soft-tissue]
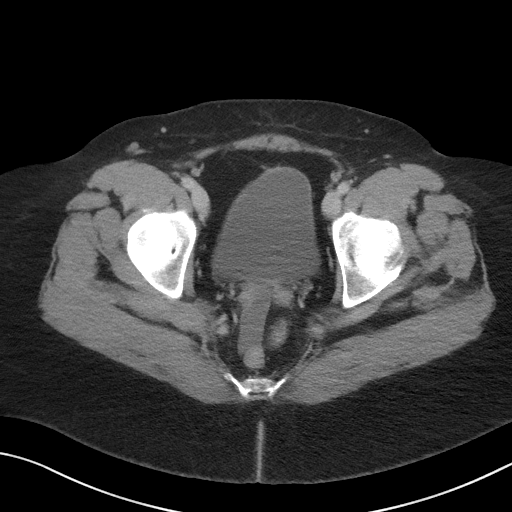
[im 25/93  soft-tissue]
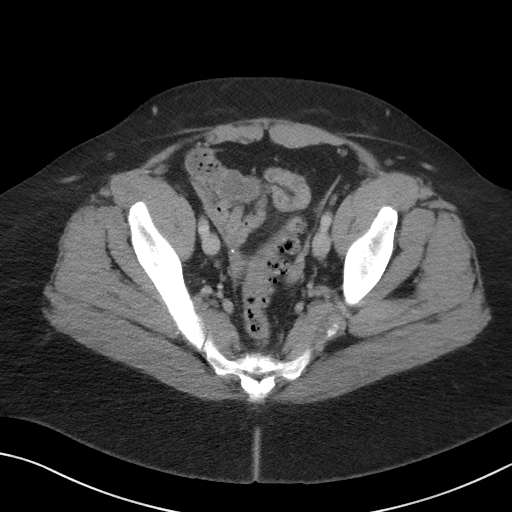
[im 30/93  soft-tissue]
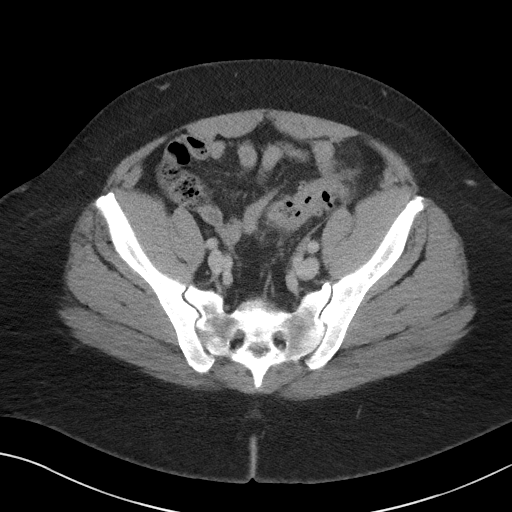
[im 39/93  soft-tissue]
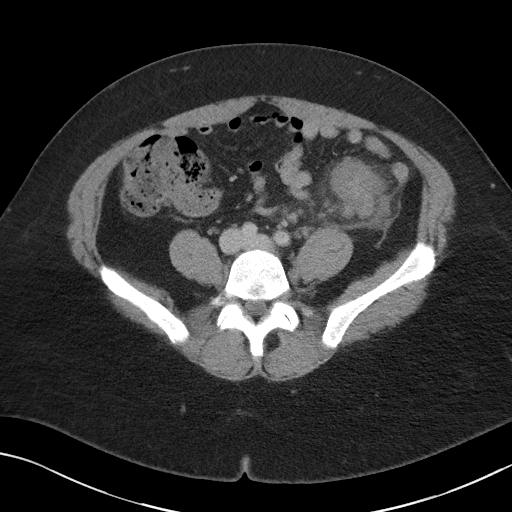
[im 44/93  soft-tissue]
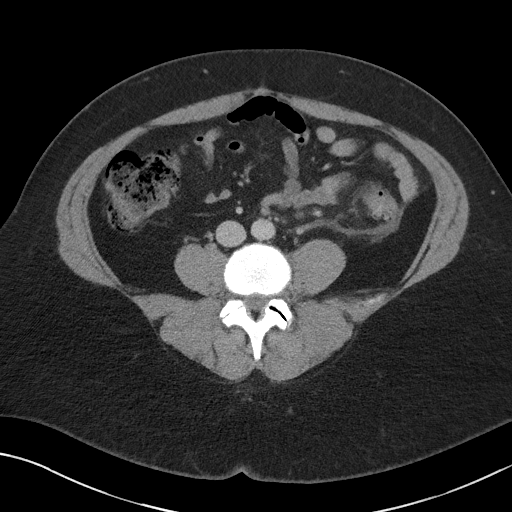
[im 49/93  soft-tissue]
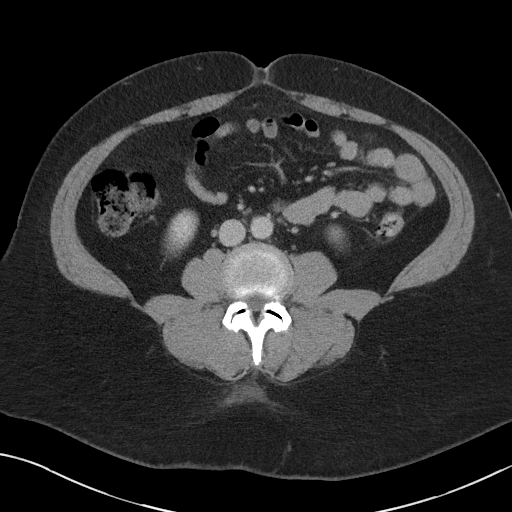
[im 54/93  soft-tissue]
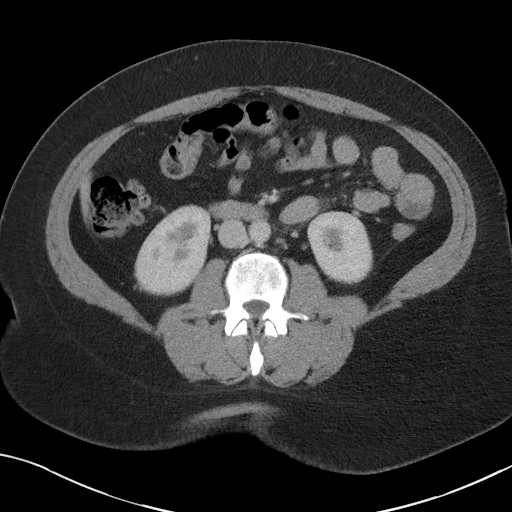
[im 54/93  bone]
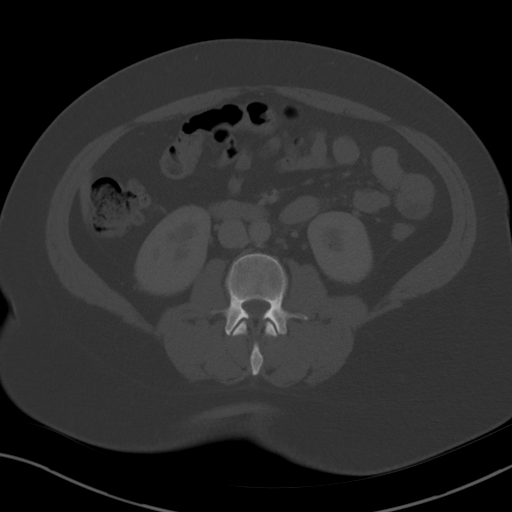
[im 63/93  soft-tissue]
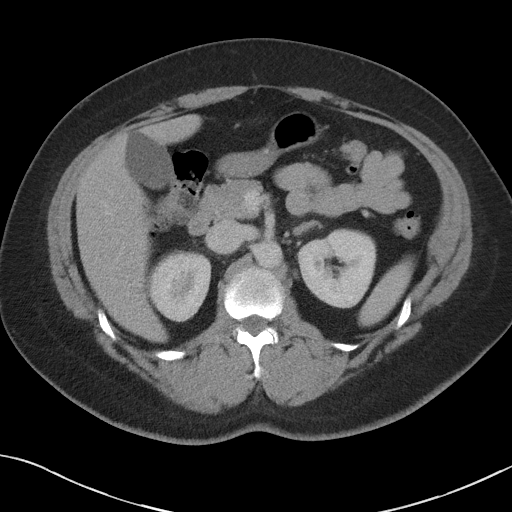
[im 68/93  soft-tissue]
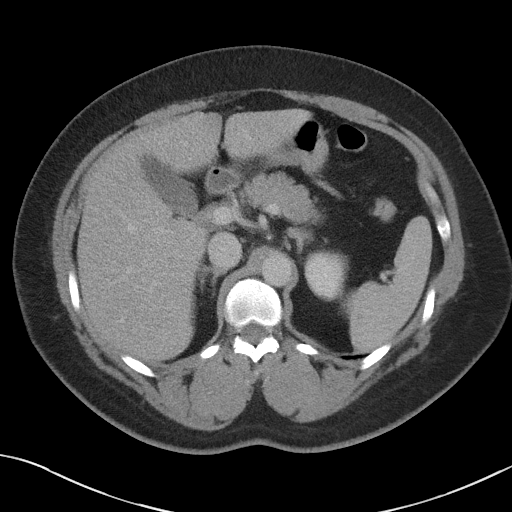
[im 73/93  soft-tissue]
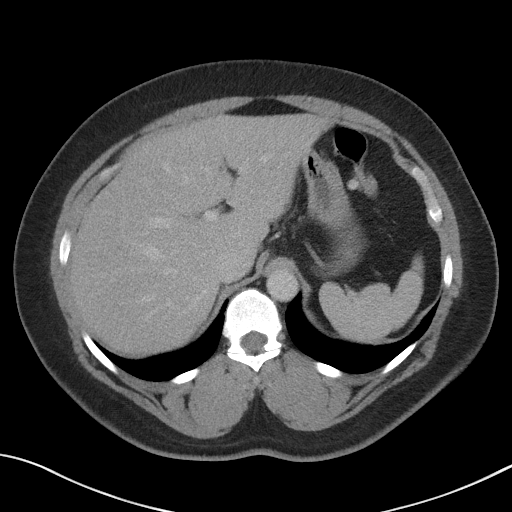
[im 83/93  soft-tissue]
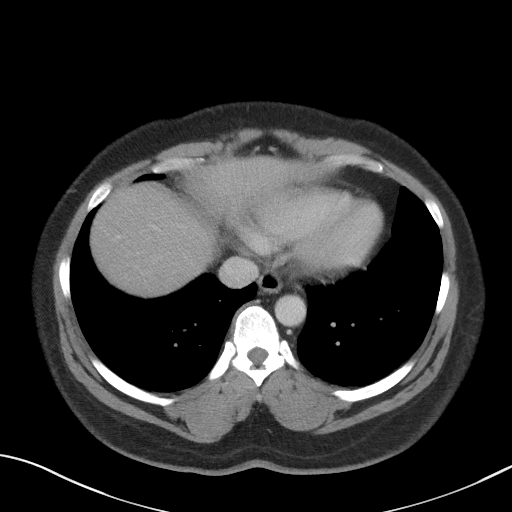
[im 88/93  soft-tissue]
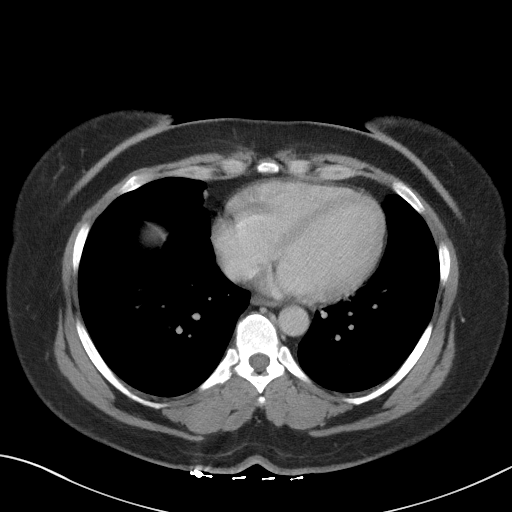

[Series 5: coronal st · coronal · 0.90mm/px · 3 of 98 slices shown]
[im 33/98  soft-tissue]
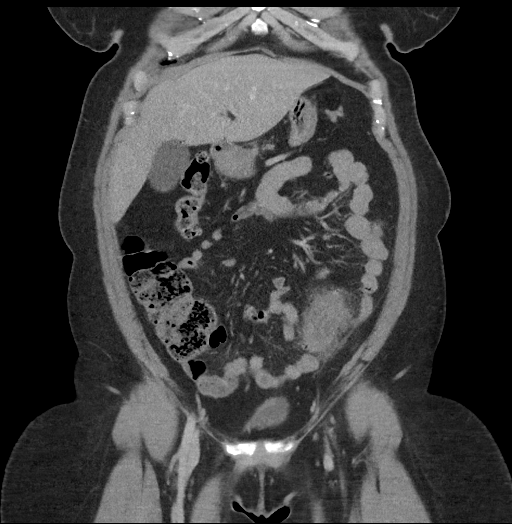
[im 44/98  soft-tissue]
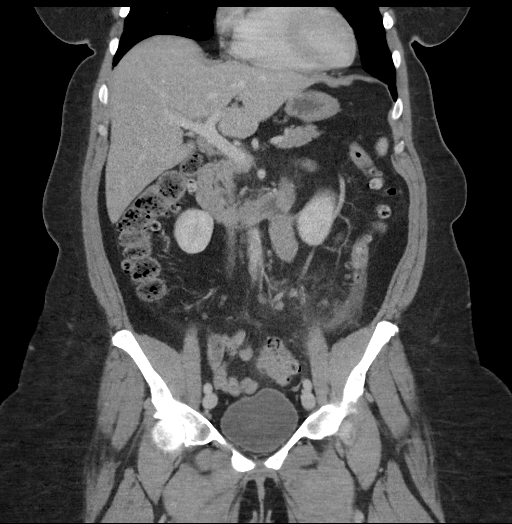
[im 54/98  soft-tissue]
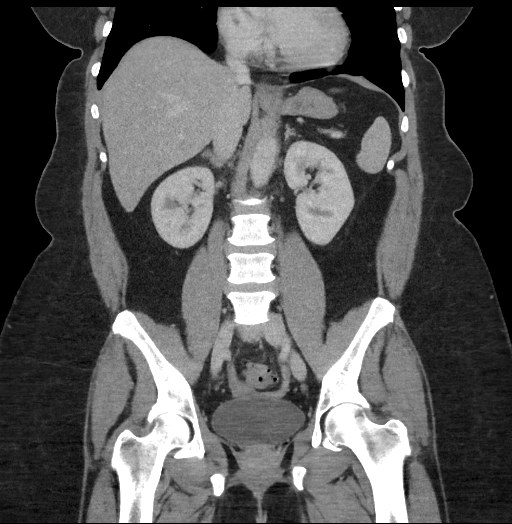

[17 of 46 positions shown; findings below may reference images not displayed]

FINDINGS: Lower chest: The visualized heart size within normal limits. No
pericardial fluid/thickening.

No hiatal hernia.

The visualized portions of the lungs are clear.

Hepatobiliary: The liver is normal in density without focal
abnormality.The main portal vein is patent. No evidence of calcified
gallstones, gallbladder wall thickening or biliary dilatation.

Pancreas: Unremarkable. No pancreatic ductal dilatation or
surrounding inflammatory changes.

Spleen: Normal in size without focal abnormality.

Adrenals/Urinary Tract: Both adrenal glands appear normal. The
kidneys and collecting system appear normal without evidence of
urinary tract calculus or hydronephrosis. Bladder is unremarkable.

Stomach/Bowel: The stomach and small bowel are normal in appearance.
There is a focal segment sigmoid colon with diverticula said has
diffuse wall thickening and surrounding mesenteric fat stranding
changes. There is non loculated fluid seen posteriorly. No loculated
fluid collections or free air is seen.

Vascular/Lymphatic: There are no enlarged mesenteric,
retroperitoneal, or pelvic lymph nodes. No significant vascular
findings are present.

Reproductive: The patient is status post hysterectomy. No adnexal
masses or collections seen.

Other: No evidence of abdominal wall mass or hernia.

Musculoskeletal: No acute or significant osseous findings.
IMPRESSION: Findings consistent with acute sigmoid colonic diverticulitis. No
loculated fluid collections or free air.

## 2021-08-10 ENCOUNTER — Ambulatory Visit
Admission: EM | Admit: 2021-08-10 | Discharge: 2021-08-10 | Disposition: A | Discharge status: Home / Self Care | Payer: Non-veteran care | Attending: Internal Medicine | Admitting: Internal Medicine

## 2021-08-10 ENCOUNTER — Encounter: Payer: Self-pay | Admitting: Emergency Medicine

## 2021-08-10 ENCOUNTER — Ambulatory Visit: Payer: Non-veteran care

## 2021-08-10 ENCOUNTER — Other Ambulatory Visit: Payer: Self-pay

## 2021-08-10 ENCOUNTER — Ambulatory Visit (INDEPENDENT_AMBULATORY_CARE_PROVIDER_SITE_OTHER): Payer: Non-veteran care

## 2021-08-10 DIAGNOSIS — M79674 Pain in right toe(s): Secondary | ICD-10-CM | POA: Diagnosis not present

## 2021-08-10 DIAGNOSIS — M79671 Pain in right foot: Secondary | ICD-10-CM | POA: Diagnosis not present

## 2021-08-10 NOTE — ED Triage Notes (Signed)
Right 4th toe pain/swelling/bruising after hitting it on a basket last week. Requesting x-ray. Denies pain into metatarsals

## 2021-08-10 NOTE — ED Provider Notes (Signed)
[facility]    CSN: [id number] Arrival date & time: 08/10/21  2353      History   Chief Complaint Chief Complaint  Patient presents with   Foot Pain    HPI Deborah Chaney is a 56 y.o. female.   Patient presents with right fourth toe pain and swelling that started approximately 1 week ago after an injury.  Patient reports that she hit her foot on a basket.  Denies any numbness or tingling to the area.  Denies pain in any other part of the foot.  Pain occurs mainly with bearing weight.   Foot Pain   Past Medical History:  Diagnosis Date   Back pain    Bilateral swelling of feet    Diverticulitis    Hypertension    Joint pain    Multiple food allergies    Ovarian cyst    PONV (postoperative nausea and vomiting)    Sickle cell disease ([place])    Vitamin D deficiency     Patient Active Problem List   Diagnosis Date Noted   Diverticulitis of intestine with abscess 08/08/2020   Diverticulitis of colon with perforation 06/20/2020   Sepsis ([place]) 06/20/2020   Obesity, Class III, BMI 40-49.9 (morbid obesity) ([place]) 06/20/2020   Breast lump 02/21/2017   Cyst of ovary 02/21/2017   Lower abdominal pain 02/21/2017   Obesity 02/21/2017   Upper respiratory infection 02/21/2017    Past Surgical History:  Procedure Laterality Date   ABDOMINAL HYSTERECTOMY     APPENDECTOMY     CESAREAN SECTION     COLON RESECTION SIGMOID N/A 08/08/2020   Procedure: OPEN SIGMOID COLECTOMY;  Surgeon: Felicie Morn, MD;  Location: [facility];  Service: General;  Laterality: N/A;   FOOT SURGERY  2008   LAPAROSCOPIC OVARIAN CYSTECTOMY     TUBAL LIGATION      OB History     Gravida  1   Para      Term      Preterm      AB      Living         SAB      IAB      Ectopic      Multiple      Live Births               Home Medications    Prior to Admission medications   Medication Sig Start Date End Date Taking? Authorizing Provider  acetaminophen  (TYLENOL) 500 MG tablet Take 1 tablet (500 mg total) by mouth every 6 (six) hours as needed. 08/10/20 08/10/21  Stechschulte, Nickola Major, MD  aspirin EC 81 MG tablet Take 81 mg by mouth daily. Swallow whole.    [provider]  Carboxymeth-Cellulose-CitricAc (PLENITY WELCOME KIT) CAPS Take 3 capsules by mouth 2 (two) times daily. 04/18/21   Laqueta Linden, MD  ibuprofen (ADVIL) 800 MG tablet Take 800 mg by mouth every 8 (eight) hours as needed.    [provider]  melatonin 5 MG TABS Take 5 mg by mouth at bedtime as needed (sleep).    [provider]  promethazine (PHENERGAN) 25 MG tablet Take 1 tablet (25 mg total) by mouth every 8 (eight) hours as needed for nausea or vomiting. 11/02/15 05/29/20  Hyatt, Romilda Garret, DPM    Family History Family History  Problem Relation Age of Onset   Hypertension Mother    Diabetes Mother    Hyperlipidemia Mother  Heart disease Mother    Obesity Mother    Kidney disease Mother    Hypertension Father    Diabetes Father    Hyperlipidemia Father    Obesity Father     Social History Social History   Tobacco Use   Smoking status: Never   Smokeless tobacco: Never  Vaping Use   Vaping Use: Never used  Substance Use Topics   Alcohol use: No   Drug use: No     Allergies   Penicillins and Shrimp (diagnostic)   Review of Systems Review of Systems Per HPI  Physical Exam Triage Vital Signs ED Triage Vitals  Enc Vitals Group     BP 08/10/21 0910 127/89     Pulse Rate 08/10/21 0910 84     Resp 08/10/21 0910 16     Temp 08/10/21 0910 97.9 F (36.6 C)     Temp Source 08/10/21 0910 Oral     SpO2 08/10/21 0910 95 %     Weight --      Height --      Head Circumference --      Peak Flow --      Pain Score 08/10/21 0909 1     Pain Loc --      Pain Edu? --      Excl. in [place]? --    No data found.  Updated Vital Signs BP 127/89 (BP Location: Left Arm)    Pulse 84    Temp 97.9 F (36.6 C) (Oral)    Resp 16    SpO2 95%    Visual Acuity Right Eye Distance:   Left Eye Distance:   Bilateral Distance:    Right Eye Near:   Left Eye Near:    Bilateral Near:     Physical Exam Constitutional:      General: She is not in acute distress.    Appearance: Normal appearance. She is not toxic-appearing or diaphoretic.  HENT:     Head: Normocephalic and atraumatic.  Eyes:     Extraocular Movements: Extraocular movements intact.     Conjunctiva/sclera: Conjunctivae normal.  Pulmonary:     Effort: Pulmonary effort is normal.  Musculoskeletal:     Right foot: Normal range of motion and normal capillary refill. Swelling and tenderness present. No bony tenderness or crepitus. Normal pulse.     Left foot: Normal.     Comments: Patient has a small approximately 1 mm healing abrasion to dorsal surface of right toe.  No bleeding noted.  No signs of infection.  There is moderate swelling as well.  Neurovascular intact.  Patient has full range of motion.  Neurological:     General: No focal deficit present.     Mental Status: She is alert and oriented to person, place, and time. Mental status is at baseline.  Psychiatric:        Mood and Affect: Mood normal.        Behavior: Behavior normal.        Thought Content: Thought content normal.        Judgment: Judgment normal.     UC Treatments / Results  Labs (all labs ordered are listed, but only abnormal results are displayed) Labs Reviewed - No data to display  EKG   Radiology DG Foot Complete Right  Result Date: 08/10/2021 CLINICAL DATA:  Foot pain.  Toe pain and swelling. EXAM: RIGHT FOOT COMPLETE - 3+ VIEW COMPARISON:  08/14/2018 FINDINGS: No acute fracture or dislocation identified. Mild  hallux valgus deformity with degenerative changes noted at the first MTP joint. IMPRESSION: 1. No acute findings. 2. Hallux valgus deformity. Electronically Signed   By: Kerby Moors M.D.   On: 08/10/2021 09:46    Procedures Procedures (including critical care  time)  Medications Ordered in UC Medications - No data to display  Initial Impression / Assessment and Plan / UC Course  I have reviewed the triage vital signs and the nursing notes.  Pertinent labs & imaging results that were available during my care of the patient were reviewed by me and considered in my medical decision making (see chart for details).     Right foot x-ray was negative for any acute bony abnormality.  Suspect contusion or toe sprain.  There is a small abrasion noted as well that is barely open.  No signs of infection.  Patient to monitor for signs of infection.  Discussed supportive care and symptom management with patient.  Discussed return precautions.  Patient verbalized understanding and was agreeable with plan. Final Clinical Impressions(s) / UC Diagnoses   Final diagnoses:  Pain in right toe(s)     Discharge Instructions      Your x-ray was negative.  Please monitor and follow-up if any signs of infection occur.  Follow-up if pain persists as well.    ED Prescriptions   None    PDMP not reviewed this encounter.   Teodora Medici, [place] 08/10/21 [phone]

## 2021-08-10 NOTE — Discharge Instructions (Signed)
Your x-ray was negative.  Please monitor and follow-up if any signs of infection occur.  Follow-up if pain persists as well.

## 2021-10-04 ENCOUNTER — Other Ambulatory Visit: Payer: Self-pay | Admitting: *Deleted

## 2021-10-04 ENCOUNTER — Encounter: Payer: Self-pay | Admitting: *Deleted

## 2021-10-04 NOTE — Progress Notes (Signed)
? ?Referring:  ?Donald Prose, MD ?[address] ?[address] ?[place],  [place] [address] ? ?PCP: ?Donald Prose, MD ? ?Neurology was asked to evaluate Deborah Chaney, a 56 year old female for a chief complaint of headaches.  Our recommendations of care will be communicated by shared medical record.   ? ?CC:  headaches ? ?History provided from self ? ?HPI:  ?Medical co-morbidities: obesity ? ?The patient presents for evaluation of right sided headaches which began in January 2022. No clear inciting events for headache onset. They are described as right occipital throbbing pain which radiates forward. Headaches are not associated with photophobia, phonophobia, or nausea. They will last until she takes medication. She currently wakes up with headaches 2 days per week. Takes aspirin and Tylenol as needed which does usually reduce the headache. She does endorse neck pain on the right side without radicular features. ? ?She has a history of "stress headaches:, but feels this current headache is different. ? ?Headache History: ?Onset: January 2022 ?Triggers: wakes up with it ?Aura: no ?Location: right occiput radiating forward ?Quality/Description: throbbing ?Associated Symptoms: ? Photophobia: no ? Phonophobia: no ? Nausea: no ?Worse with activity?: yes ?Duration of headaches: hours until she takes tylenol ? ?Headache days per month: 8 ?Headache free days per month: 12 ? ?Current Treatment: ?Abortive ?Tylenol ? ?Preventative ?none ? ?Prior Therapies                                 ?Tylenol ? ?LABS: ?09/27/21: CBC, CMP, ESR wnl ? ?IMAGING:  ?Geraldine 2015: unremarkable ? ?Imaging independently reviewed on October 05, 2021  ? ?Current Outpatient Medications on File Prior to Visit  ?Medication Sig Dispense Refill  ? aspirin 81 MG EC tablet Take 1 tablet by mouth daily.    ? ergocalciferol (VITAMIN D2) 1.25 MG (50000 UT) capsule Take by mouth.    ? ibuprofen (ADVIL) 800 MG tablet Take 800 mg by mouth every 8 (eight) hours as needed.     ? melatonin 5 MG TABS Take 5 mg by mouth at bedtime as needed (sleep).    ? [DISCONTINUED] promethazine (PHENERGAN) 25 MG tablet Take 1 tablet (25 mg total) by mouth every 8 (eight) hours as needed for nausea or vomiting. 20 tablet 0  ? ?Current Facility-Administered Medications on File Prior to Visit  ?Medication Dose Route Frequency Provider Last Rate Last Admin  ? clindamycin (CLEOCIN) 900 mg in dextrose 5 % 50 mL IVPB  900 mg Intravenous 60 min Pre-Op Stechschulte, Nickola Major, MD      ? And  ? gentamicin (GARAMYCIN) 5 mg/kg in dextrose 5 % 50 mL IVPB  5 mg/kg Intravenous 60 min Pre-Op Stechschulte, Nickola Major, MD      ? ? ? ?Allergies: ?Allergies  ?Allergen Reactions  ? Penicillins Other (See Comments)  ?  Head feels tight ?Face tightens up  ? Shrimp (Diagnostic) Itching  ?  Throat itching  ? ? ?Family History: ?Migraine or other headaches in the family:  no ?Aneurysms in a first degree relative:  no ?Brain tumors in the family:  no ?Other neurological illness in the family:   no ? ?Past Medical History: ?Past Medical History:  ?Diagnosis Date  ? Back pain   ? Bilateral swelling of feet   ? DDD (degenerative disc disease), lumbar   ? Diverticulitis   ? Headache   ? Hypertension   ? Joint pain   ?  Multiple food allergies   ? Ovarian cyst   ? PONV (postoperative nausea and vomiting)   ? Sickle cell disease ([place])   ? Vitamin D deficiency   ? ? ?Past Surgical History ?Past Surgical History:  ?Procedure Laterality Date  ? ABDOMINAL HYSTERECTOMY  2008  ? APPENDECTOMY  1982  ? [place]  ? COLON RESECTION SIGMOID N/A 08/08/2020  ? Procedure: OPEN SIGMOID COLECTOMY;  Surgeon: Felicie Morn, MD;  Location: [facility];  Service: General;  Laterality: N/A;  ? FOOT SURGERY  2008  ? LAPAROSCOPIC OVARIAN CYSTECTOMY  2004  ? TUBAL LIGATION  1995  ? ? ?Social History: ?Social History  ? ?Tobacco Use  ? Smoking status: Never  ? Smokeless tobacco: Never  ?Vaping Use  ? Vaping Use: Never used  ?Substance Use Topics  ?  Alcohol use: No  ? Drug use: No  ? ? ?ROS: ?Negative for fevers, chills. Positive for headaches, neck pain. All other systems reviewed and negative unless stated otherwise in HPI. ? ? ?Physical Exam:  ? ?Vital Signs: ?BP 130/88   Pulse 97   Ht $R'5\' 3"'JK$  (1.6 m)   Wt 242 lb (109.8 kg)   BMI 42.87 kg/m?  ?GENERAL: well appearing,in no acute distress,alert ?SKIN:  Color, texture, turgor normal. No rashes or lesions ?HEAD:  Normocephalic/atraumatic. ?CV:  RRR ?RESP: Normal respiratory effort ?MSK: +tenderness to palpation over right neck and occiput ? ?NEUROLOGICAL: ?Mental Status: Alert, oriented to person, place and time,Follows commands ?Cranial Nerves: PERRL, visual fields intact to confrontation, extraocular movements intact, facial sensation intact, no facial droop or ptosis, hearing grossly intact, no dysarthria ?Motor: muscle strength 5/5 both upper and lower extremities ?Reflexes: 2+ throughout ?Sensation: intact to light touch all 4 extremities ?Coordination: Finger-to- nose-finger intact bilaterally ?Gait: normal-based ? ? ?IMPRESSION: ?56 year old female who presents for evaluation of right sided headaches over the past 3 months. MRI brain ordered for new side-locked headache in patient >50. Her right neck and occiput are tender to palpation, which is suggestive of occipital neuralgia. Offered referral to neck PT, however she does not think she could fit this into her schedule. She would prefer not to start a medication for headaches at this time. ? ?PLAN: ?-MRI brain ?-Consider neck PT. For now patient will look up neck stretching exercises online ?-next steps: consider SNRI, gabapentin, muscle relaxer, occipital nerve block ? ? ?I spent a total of 21 minutes chart reviewing and counseling the patient. Headache education was done. Discussed treatment options including physical therapy. Written educational materials and patient instructions outlining all of the above were given. ? ?Follow-up: 4  months ? ? ?Genia Harold, MD ?10/05/2021   ?8:36 AM ? ? ?

## 2021-10-05 ENCOUNTER — Telehealth: Payer: Self-pay | Admitting: Psychiatry

## 2021-10-05 ENCOUNTER — Encounter: Payer: Self-pay | Admitting: Psychiatry

## 2021-10-05 ENCOUNTER — Ambulatory Visit (INDEPENDENT_AMBULATORY_CARE_PROVIDER_SITE_OTHER): Payer: BC Managed Care – PPO | Admitting: Psychiatry

## 2021-10-05 VITALS — BP 130/88 | HR 97 | Ht 63.0 in | Wt 242.0 lb

## 2021-10-05 DIAGNOSIS — R519 Headache, unspecified: Secondary | ICD-10-CM

## 2021-10-05 DIAGNOSIS — M5481 Occipital neuralgia: Secondary | ICD-10-CM

## 2021-10-05 NOTE — Patient Instructions (Signed)
MRI brain

## 2021-10-05 NOTE — Telephone Encounter (Signed)
[employer] Berkley Harvey: [phone] (exp. 10/05/21 to 11/03/21)/[facility] order sent to [facility], they will reach out to the patient to schedule.  ?

## 2021-10-16 ENCOUNTER — Ambulatory Visit
Admission: RE | Admit: 2021-10-16 | Discharge: 2021-10-16 | Disposition: A | Discharge status: Home / Self Care | Payer: BC Managed Care – PPO | Source: Ambulatory Visit | Attending: Psychiatry | Admitting: Psychiatry

## 2021-10-16 DIAGNOSIS — R519 Headache, unspecified: Secondary | ICD-10-CM | POA: Diagnosis not present

## 2021-10-16 MED ORDER — GADOBENATE DIMEGLUMINE 529 MG/ML IV SOLN
20.0000 mL | Freq: Once | INTRAVENOUS | Status: AC | PRN
  Administered 2021-10-16: 20 mL via INTRAVENOUS

## 2021-11-26 ENCOUNTER — Ambulatory Visit
Admission: EM | Admit: 2021-11-26 | Discharge: 2021-11-26 | Disposition: A | Discharge status: Home / Self Care | Payer: Non-veteran care | Attending: Internal Medicine | Admitting: Internal Medicine

## 2021-11-26 DIAGNOSIS — H65192 Other acute nonsuppurative otitis media, left ear: Secondary | ICD-10-CM

## 2021-11-26 DIAGNOSIS — J0100 Acute maxillary sinusitis, unspecified: Secondary | ICD-10-CM | POA: Diagnosis not present

## 2021-11-26 MED ORDER — DOXYCYCLINE HYCLATE 100 MG PO CAPS: 100.0000 mg | ORAL_CAPSULE | Freq: Two times a day (BID) | ORAL | 0 refills | Status: DC

## 2021-11-26 MED ORDER — FLUCONAZOLE 150 MG PO TABS: 150.0000 mg | ORAL_TABLET | Freq: Every day | ORAL | 0 refills | Status: DC

## 2021-11-26 NOTE — ED Provider Notes (Signed)
[facility]    CSN: [id number] Arrival date & time: 11/26/21  1058      History   Chief Complaint Chief Complaint  Patient presents with   Ear Fullness   Tinnitus    HPI Deborah Chaney is a 56 y.o. female.   Patient presents with feelings of left ear fullness and headache that started yesterday.  Patient reports that she has had some nasal congestion but attributes this to allergies.  She reports some pressure and pain in the left cheek of her face as well.  Denies any known sick contacts.  Denies any known fever.  Denies cough, chest pain, shortness of breath, sore throat, nausea, vomiting, diarrhea, abdominal pain.  Patient reports that she had an MRI a few weeks ago that showed sinus inflammation. MRI was due to persistent headaches.    Ear Fullness   Past Medical History:  Diagnosis Date   Back pain    Bilateral swelling of feet    DDD (degenerative disc disease), lumbar    Diverticulitis    Headache    Hypertension    Joint pain    Multiple food allergies    Ovarian cyst    PONV (postoperative nausea and vomiting)    Sickle cell disease (HCC)    Vitamin D deficiency     Patient Active Problem List   Diagnosis Date Noted   Diverticulitis of intestine with abscess 08/08/2020   Diverticulitis of colon with perforation 06/20/2020   Sepsis (HCC) 06/20/2020   Obesity, Class III, BMI 40-49.9 (morbid obesity) (HCC) 06/20/2020   Breast lump 02/21/2017   Cyst of ovary 02/21/2017   Lower abdominal pain 02/21/2017   Obesity 02/21/2017   Upper respiratory infection 02/21/2017    Past Surgical History:  Procedure Laterality Date   ABDOMINAL HYSTERECTOMY  2008   APPENDECTOMY  1982   CESAREAN SECTION  1993   COLON RESECTION SIGMOID N/A 08/08/2020   Procedure: OPEN SIGMOID COLECTOMY;  Surgeon: Quentin Ore, MD;  Location: [facility];  Service: General;  Laterality: N/A;   FOOT SURGERY  2008   LAPAROSCOPIC OVARIAN CYSTECTOMY  2004   TUBAL  LIGATION  1995    OB History     Gravida  1   Para      Term      Preterm      AB      Living         SAB      IAB      Ectopic      Multiple      Live Births               Home Medications    Prior to Admission medications   Medication Sig Start Date End Date Taking? Authorizing Provider  doxycycline (VIBRAMYCIN) 100 MG capsule Take 1 capsule (100 mg total) by mouth 2 (two) times daily. 11/26/21  Yes Shene Maxfield, Rolly Salter E, FNP  fluconazole (DIFLUCAN) 150 MG tablet Take 1 tablet (150 mg total) by mouth daily. Take at first sign of vaginal yeast. 11/26/21  Yes Gustavus Bryant, FNP  aspirin 81 MG EC tablet Take 1 tablet by mouth daily.    [provider]  ergocalciferol (VITAMIN D2) 1.25 MG (50000 UT) capsule Take by mouth.    [provider]  ibuprofen (ADVIL) 800 MG tablet Take 800 mg by mouth every 8 (eight) hours as needed.    [provider]  melatonin 5 MG TABS Take 5  mg by mouth at bedtime as needed (sleep).    [provider]  promethazine (PHENERGAN) 25 MG tablet Take 1 tablet (25 mg total) by mouth every 8 (eight) hours as needed for nausea or vomiting. 11/02/15 05/29/20  Hyatt, Max T, DPM    Family History Family History  Problem Relation Age of Onset   Hypertension Mother    Diabetes Mother    Hyperlipidemia Mother    Heart disease Mother    Obesity Mother    Kidney disease Mother    Hypertension Father    Diabetes Father    Hyperlipidemia Father    Obesity Father     Social History Social History   Tobacco Use   Smoking status: Never   Smokeless tobacco: Never  Vaping Use   Vaping Use: Never used  Substance Use Topics   Alcohol use: No   Drug use: No     Allergies   Penicillins and Shrimp (diagnostic)   Review of Systems Review of Systems Per HPI  Physical Exam Triage Vital Signs ED Triage Vitals [11/26/21 1117]  Enc Vitals Group     BP 130/87     Pulse Rate 93     Resp 14     Temp 98.1 F  (36.7 C)     Temp src      SpO2 95 %     Weight      Height      Head Circumference      Peak Flow      Pain Score 0     Pain Loc      Pain Edu?      Excl. in GC?    No data found.  Updated Vital Signs BP 130/87   Pulse 93   Temp 98.1 F (36.7 C)   Resp 14   SpO2 95%   Visual Acuity Right Eye Distance:   Left Eye Distance:   Bilateral Distance:    Right Eye Near:   Left Eye Near:    Bilateral Near:     Physical Exam Constitutional:      General: She is not in acute distress.    Appearance: Normal appearance. She is not toxic-appearing or diaphoretic.  HENT:     Head: Normocephalic and atraumatic.     Right Ear: Tympanic membrane and ear canal normal.     Left Ear: Ear canal normal. A middle ear effusion is present.     Nose: Congestion present.     Mouth/Throat:     Mouth: Mucous membranes are moist.     Pharynx: No posterior oropharyngeal erythema.  Eyes:     Extraocular Movements: Extraocular movements intact.     Conjunctiva/sclera: Conjunctivae normal.     Pupils: Pupils are equal, round, and reactive to light.  Cardiovascular:     Rate and Rhythm: Normal rate and regular rhythm.     Pulses: Normal pulses.     Heart sounds: Normal heart sounds.  Pulmonary:     Effort: Pulmonary effort is normal. No respiratory distress.     Breath sounds: Normal breath sounds. No stridor. No wheezing, rhonchi or rales.  Abdominal:     General: Abdomen is flat. Bowel sounds are normal.     Palpations: Abdomen is soft.  Musculoskeletal:        General: Normal range of motion.     Cervical back: Normal range of motion.  Skin:    General: Skin is warm and dry.  Neurological:  General: No focal deficit present.     Mental Status: She is alert and oriented to person, place, and time. Mental status is at baseline.  Psychiatric:        Mood and Affect: Mood normal.        Behavior: Behavior normal.     UC Treatments / Results  Labs (all labs ordered are listed,  but only abnormal results are displayed) Labs Reviewed - No data to display  EKG   Radiology No results found.  Procedures Procedures (including critical care time)  Medications Ordered in UC Medications - No data to display  Initial Impression / Assessment and Plan / UC Course  I have reviewed the triage vital signs and the nursing notes.  Pertinent labs & imaging results that were available during my care of the patient were reviewed by me and considered in my medical decision making (see chart for details).     After further review of patient's recent MRI, it showed maxillary sinus inflammation.  I am concerned that this could be persistent and causing patient's symptoms.  She also has left middle ear effusion.  Will treat all of this with doxycycline antibiotic given patient's penicillin allergy.  Patient was given strict return precautions.  Patient verbalized understanding and was agreeable with plan. Final Clinical Impressions(s) / UC Diagnoses   Final diagnoses:  Acute non-recurrent maxillary sinusitis  Acute MEE (middle ear effusion), left     Discharge Instructions      You have been prescribed antibiotic to alleviate symptoms.  Please follow-up if symptoms persist or worsen.    ED Prescriptions     Medication Sig Dispense Auth. Provider   doxycycline (VIBRAMYCIN) 100 MG capsule Take 1 capsule (100 mg total) by mouth 2 (two) times daily. 20 capsule [place], PosenHaley E, [place]NP   fluconazole (DIFLUCAN) 150 MG tablet Take 1 tablet (150 mg total) by mouth daily. Take at first sign of vaginal yeast. 1 tablet [place], Acie FredricksonHaley E, [place]FNP      PDMP not reviewed this encounter.   Gustavus BryantMound, Tailer Volkert E, [place]FNP 11/26/21 1247

## 2021-11-26 NOTE — ED Triage Notes (Signed)
Patient presents to Urgent Care with complaints of ear fullness, tinnitus, and ha since yesterday. Patient reports otc claritain.

## 2021-11-26 NOTE — Discharge Instructions (Signed)
You have been prescribed antibiotic to alleviate symptoms.  Please follow-up if symptoms persist or worsen.

## 2022-01-31 ENCOUNTER — Encounter (INDEPENDENT_AMBULATORY_CARE_PROVIDER_SITE_OTHER): Payer: Self-pay

## 2022-03-07 ENCOUNTER — Ambulatory Visit (INDEPENDENT_AMBULATORY_CARE_PROVIDER_SITE_OTHER): Payer: BC Managed Care – PPO | Admitting: Psychiatry

## 2022-03-07 VITALS — BP 110/67 | HR 88 | Ht 64.0 in | Wt 247.4 lb

## 2022-03-07 DIAGNOSIS — R413 Other amnesia: Secondary | ICD-10-CM

## 2022-03-07 NOTE — Patient Instructions (Signed)
Plan: Blood work: Vitamin B12 level

## 2022-03-07 NOTE — Progress Notes (Signed)
   CC:  headaches  Follow-up Visit  Last visit: 10/05/21  Brief HPI: 56 year old female who follows in clinic for right occipital neuralgia.  At her last visit, brain MRI was ordered. The patient declined medications or physical therapy at that time.  Interval History: Headaches have improved since her last visit. She has had a handful of headaches which have improved with Tylenol.  She has some concerns about her memory. Feels like she doesn't retain information as well as she used to. Has more trouble recognizing acquaintances. Sometimes has to go back and reread sentences. She does note a lot of stress at work.  MRI brain 10/16/21 was unremarkable other than chronic maxillary sinusitis.   Prior Therapies                                  Tylenol  Physical Exam:   Vital Signs: BP 110/67   Pulse 88   Ht 5\' 4"  (1.626 m)   Wt 247 lb 6 oz (112.2 kg)   BMI 42.46 kg/m  GENERAL:  well appearing, in no acute distress, alert  SKIN:  Color, texture, turgor normal. No rashes or lesions HEAD:  Normocephalic/atraumatic. RESP: normal respiratory effort MSK:  No gross joint deformities.   NEUROLOGICAL: Mental Status:     03/07/2022   11:17 AM  Montreal Cognitive Assessment   Visuospatial/ Executive (0/5) 5  Naming (0/3) 3  Attention: Read list of digits (0/2) 2  Attention: Read list of letters (0/1) 1  Attention: Serial 7 subtraction starting at 100 (0/3) 2  Language: Repeat phrase (0/2) 2  Language : Fluency (0/1) 1  Abstraction (0/2) 2  Delayed Recall (0/5) 3  Orientation (0/6) 6  Total 27  Adjusted Score (based on education) 27   Cranial Nerves: PERRL, face symmetric, no dysarthria, hearing grossly intact Motor: moves all extremities equally Gait: normal-based.  IMPRESSION: 56 year old female who presents for follow up of occipital neuralgia. Brain MRI was normal, reviewed images with patient today. Her headaches have improved significantly since her last visit. She is  taking Tylenol as needed which resolves her headache. Primary concern today is her memory. MOCA today is 27/30, within normal limits. Will check B12 level today. Suspect increased stress at work is contributing to her concentration difficulty. Discussed stress management.  PLAN: -Blood work: B12 level  Follow-up: as needed if headaches return or new memory concerns develop  I spent a total of 15 minutes on the date of the service. Headache education was done. Written educational materials and patient instructions outlining all of the above were given.  57, MD 03/07/22 11:28 AM

## 2022-03-08 LAB — VITAMIN B12: Vitamin B-12: 539 pg/mL (ref 232–1245)

## 2022-06-06 ENCOUNTER — Encounter: Payer: Self-pay | Admitting: Podiatry

## 2022-06-06 ENCOUNTER — Ambulatory Visit (INDEPENDENT_AMBULATORY_CARE_PROVIDER_SITE_OTHER): Payer: BC Managed Care – PPO

## 2022-06-06 ENCOUNTER — Ambulatory Visit (INDEPENDENT_AMBULATORY_CARE_PROVIDER_SITE_OTHER): Payer: BC Managed Care – PPO | Admitting: Podiatry

## 2022-06-06 VITALS — BP 144/84 | HR 105

## 2022-06-06 DIAGNOSIS — M778 Other enthesopathies, not elsewhere classified: Secondary | ICD-10-CM

## 2022-06-06 NOTE — Progress Notes (Signed)
   Chief Complaint  Patient presents with   Foot Pain    Patient is here for left foot pain since November, she has a knot on the top of foot an callous on the bottom of the foot.    HPI: 56 y.o. female presenting today for evaluation of left foot pain associated to a symptomatic callus on the plantar aspect of the left foot.  Patient also has developed a knot on top of her foot and would like to have it evaluated as well.  Denies a history of injury.  Has not done anything for treatment.  She does have a history of tailor's bunionectomy surgery left foot  Past Medical History:  Diagnosis Date   Back pain    Bilateral swelling of feet    DDD (degenerative disc disease), lumbar    Diverticulitis    Headache    Hypertension    Joint pain    Multiple food allergies    Ovarian cyst    PONV (postoperative nausea and vomiting)    Sickle cell disease (HCC)    Vitamin D deficiency     Past Surgical History:  Procedure Laterality Date   ABDOMINAL HYSTERECTOMY  2008   APPENDECTOMY  1982   CESAREAN SECTION  1993   COLON RESECTION SIGMOID N/A 08/08/2020   Procedure: OPEN SIGMOID COLECTOMY;  Surgeon: Quentin Ore, MD;  Location: [facility];  Service: General;  Laterality: N/A;   FOOT SURGERY  2008   LAPAROSCOPIC OVARIAN CYSTECTOMY  2004   TUBAL LIGATION  1995    Allergies  Allergen Reactions   Penicillins Other (See Comments)    Head feels tight Face tightens up   Shrimp (Diagnostic) Itching    Throat itching     Physical Exam: General: The patient is alert and oriented x3 in no acute distress.  Dermatology: Skin is warm, dry and supple bilateral lower extremities. Negative for open lesions or macerations.  There is a symptomatic callus noted to the plantar aspect of the fourth MTP left foot  Vascular: Palpable pedal pulses bilaterally. Capillary refill within normal limits.  Negative for any significant edema or erythema  Neurological: Light touch and protective threshold  grossly intact  Musculoskeletal Exam: No pedal deformities noted  Radiographic Exam LT foot 06/06/2022:  Normal osseous mineralization. Joint spaces preserved.  Two orthopedic screws are noted to the distal fifth metatarsal which appears stable and intact.  No acute fractures identified.  Dorsal spurring/exostosis noted on lateral view  Assessment: 1.  Dorsal exostosis left foot 2.  Symptomatic callus plantar aspect of the fourth MTP left   Plan of Care:  1. Patient evaluated. X-Rays reviewed.  2.  Excisional debridement of the hyperkeratotic callus was performed to the plantar aspect of the left foot without incident or bleeding using a 312 scalpel.  Patient felt relief 3.  Recommend good supportive shoes and sneakers 4.  Return to clinic as needed       Felecia Shelling, DPM [facility]  Dr. Felecia Shelling, DPM    [address] [place], [place] [address]                Office [phone]  Fax [phone]

## 2022-06-19 ENCOUNTER — Ambulatory Visit: Payer: BC Managed Care – PPO | Admitting: Podiatry

## 2022-08-31 ENCOUNTER — Ambulatory Visit: Payer: BC Managed Care – PPO | Admitting: Podiatry

## 2022-09-05 ENCOUNTER — Ambulatory Visit (INDEPENDENT_AMBULATORY_CARE_PROVIDER_SITE_OTHER): Payer: BC Managed Care – PPO

## 2022-09-05 ENCOUNTER — Ambulatory Visit (INDEPENDENT_AMBULATORY_CARE_PROVIDER_SITE_OTHER): Payer: BC Managed Care – PPO | Admitting: Podiatry

## 2022-09-05 DIAGNOSIS — M76822 Posterior tibial tendinitis, left leg: Secondary | ICD-10-CM

## 2022-09-05 MED ORDER — MELOXICAM 15 MG PO TABS: 15.0000 mg | ORAL_TABLET | Freq: Every day | ORAL | 1 refills | Status: DC

## 2022-09-05 NOTE — Progress Notes (Signed)
Chief Complaint  Patient presents with   Foot Pain    Left foot pain  Pt stated that it is not any better     HPI: 57 y.o. female presenting today for evaluation of new onset of left foot pain.  Patient states that about 1 week ago she had an acute onset of left foot pain.  She says that she has increased pain and tenderness when she is on her feet for any prolonged period of time or walking.  She denies a history of injury.  She has been resting and icing her foot on the last 2 days and there has been some improvement.  Past Medical History:  Diagnosis Date   Back pain    Bilateral swelling of feet    DDD (degenerative disc disease), lumbar    Diverticulitis    Headache    Hypertension    Joint pain    Multiple food allergies    Ovarian cyst    PONV (postoperative nausea and vomiting)    Sickle cell disease ([place])    Vitamin D deficiency     Past Surgical History:  Procedure Laterality Date   ABDOMINAL HYSTERECTOMY  2008   [place]   COLON RESECTION SIGMOID N/A 08/08/2020   Procedure: OPEN SIGMOID COLECTOMY;  Surgeon: Felicie Morn, MD;  Location: [facility];  Service: General;  Laterality: N/A;   FOOT SURGERY  2008   LAPAROSCOPIC OVARIAN CYSTECTOMY  2004   TUBAL LIGATION  1995    Allergies  Allergen Reactions   Penicillins Other (See Comments)    Head feels tight Face tightens up   Shrimp (Diagnostic) Itching    Throat itching     Physical Exam: General: The patient is alert and oriented x3 in no acute distress.  Dermatology: Skin is warm, dry and supple bilateral lower extremities. There is a symptomatic callus noted to the plantar aspect of the fourth MTP left foot  Vascular: Palpable pedal pulses bilaterally. Capillary refill within normal limits.  Negative for any significant edema or erythema  Neurological: Light touch and protective threshold grossly intact  Musculoskeletal Exam: Moderate pes planovalgus deformity  noted with weightbearing bilateral.  There is some tenderness with palpation along the posterior tibial tendon left lower extremity.  Radiographic Exam LT foot 09/05/2022:  Unchanged since prior x-rays.  Normal osseous mineralization. Joint spaces preserved.  Two orthopedic screws are noted to the distal fifth metatarsal which appears stable and intact.  No acute fractures identified.  Dorsal spurring/exostosis noted on lateral view  Assessment: 1.  Dorsal exostosis left foot 2.  Symptomatic callus plantar aspect of the fourth MTP left 3.  Posterior tibial tendinitis/posterior tibial tendon dysfunction bilateral. LT>RT   Plan of Care:  1. Patient evaluated. X-Rays reviewed.  2.  I do believe that the best long-term solution for the patient is custom molded orthotics.  Appointment with orthotics department for new custom molded orthotics 3.  Advised against going barefoot.  Recommend good supportive shoes and sneakers 4.  Prescription for meloxicam 15 mg daily 5.  Return to clinic as needed     Edrick Kins, DPM [facility]  Dr. Edrick Kins, DPM    [address]  [place], [place] [address]                Office [phone]  Fax [phone]

## 2022-09-11 ENCOUNTER — Ambulatory Visit (INDEPENDENT_AMBULATORY_CARE_PROVIDER_SITE_OTHER): Payer: BC Managed Care – PPO

## 2022-09-11 DIAGNOSIS — M76822 Posterior tibial tendinitis, left leg: Secondary | ICD-10-CM

## 2022-09-11 NOTE — Progress Notes (Addendum)
Patient presents today to be casted for custom molded orthotics. EVANS is the treating physician.   Patient info-  Shoe size: 9  Shoe style: DRESS  Height: 5FT 4IN  Weight: 245  Insurance: [place]   Patient will call back to schedule appointment after speaking with her insurance company to make sure that the orthotics will be covered.

## 2022-11-25 ENCOUNTER — Other Ambulatory Visit: Payer: Self-pay | Admitting: Podiatry

## 2022-12-28 ENCOUNTER — Other Ambulatory Visit: Payer: Self-pay | Admitting: Podiatry

## 2023-01-02 ENCOUNTER — Other Ambulatory Visit: Payer: Self-pay | Admitting: Family Medicine

## 2023-01-02 DIAGNOSIS — Z1231 Encounter for screening mammogram for malignant neoplasm of breast: Secondary | ICD-10-CM

## 2023-01-07 ENCOUNTER — Ambulatory Visit
Admission: RE | Admit: 2023-01-07 | Discharge: 2023-01-07 | Disposition: A | Discharge status: Home / Self Care | Payer: BC Managed Care – PPO | Source: Ambulatory Visit | Attending: Family Medicine | Admitting: Family Medicine

## 2023-01-07 DIAGNOSIS — Z1231 Encounter for screening mammogram for malignant neoplasm of breast: Secondary | ICD-10-CM

## 2023-07-09 ENCOUNTER — Other Ambulatory Visit: Payer: Self-pay | Admitting: Physical Medicine & Rehabilitation

## 2023-07-09 DIAGNOSIS — M5416 Radiculopathy, lumbar region: Secondary | ICD-10-CM

## 2023-07-12 ENCOUNTER — Ambulatory Visit
Admission: RE | Admit: 2023-07-12 | Discharge: 2023-07-12 | Disposition: A | Discharge status: Home / Self Care | Payer: Non-veteran care | Source: Ambulatory Visit | Attending: Physical Medicine & Rehabilitation | Admitting: Physical Medicine & Rehabilitation

## 2023-07-12 DIAGNOSIS — M5416 Radiculopathy, lumbar region: Secondary | ICD-10-CM

## 2023-07-16 ENCOUNTER — Other Ambulatory Visit (HOSPITAL_BASED_OUTPATIENT_CLINIC_OR_DEPARTMENT_OTHER): Payer: Self-pay

## 2023-07-16 ENCOUNTER — Emergency Department (HOSPITAL_BASED_OUTPATIENT_CLINIC_OR_DEPARTMENT_OTHER)
Admission: EM | Admit: 2023-07-16 | Discharge: 2023-07-16 | Disposition: A | Discharge status: Home / Self Care | Payer: 59 | Attending: Emergency Medicine | Admitting: Emergency Medicine

## 2023-07-16 ENCOUNTER — Other Ambulatory Visit: Payer: Self-pay

## 2023-07-16 ENCOUNTER — Encounter (HOSPITAL_BASED_OUTPATIENT_CLINIC_OR_DEPARTMENT_OTHER): Payer: Self-pay

## 2023-07-16 DIAGNOSIS — M5442 Lumbago with sciatica, left side: Secondary | ICD-10-CM | POA: Diagnosis not present

## 2023-07-16 DIAGNOSIS — M545 Low back pain, unspecified: Secondary | ICD-10-CM | POA: Diagnosis present

## 2023-07-16 DIAGNOSIS — M5440 Lumbago with sciatica, unspecified side: Secondary | ICD-10-CM

## 2023-07-16 MED ORDER — OXYCODONE HCL 5 MG PO TABS
5.0000 mg | ORAL_TABLET | Freq: Once | ORAL | Status: AC
  Administered 2023-07-16: 5 mg via ORAL
  Filled 2023-07-16: qty 1

## 2023-07-16 MED ORDER — OXYCODONE HCL 5 MG PO TABS
5.0000 mg | ORAL_TABLET | Freq: Four times a day (QID) | ORAL | 0 refills | Status: DC | PRN
  Filled 2023-07-16: qty 10, 3d supply, fill #0

## 2023-07-16 MED ORDER — LIDOCAINE 5 % EX PTCH
1.0000 | MEDICATED_PATCH | CUTANEOUS | Status: DC
  Administered 2023-07-16: 1 via TRANSDERMAL
  Filled 2023-07-16: qty 1

## 2023-07-16 MED ORDER — LIDOCAINE 5 % EX PTCH
1.0000 | MEDICATED_PATCH | CUTANEOUS | 0 refills | Status: DC
  Filled 2023-07-16: qty 30, 30d supply, fill #0

## 2023-07-16 MED ORDER — ACETAMINOPHEN 500 MG PO TABS
1000.0000 mg | ORAL_TABLET | Freq: Once | ORAL | Status: AC
  Administered 2023-07-16: 1000 mg via ORAL
  Filled 2023-07-16: qty 2

## 2023-07-16 NOTE — ED Triage Notes (Signed)
Pt reports left lower back pain radiating down left leg x 2 months. Left leg numbness  Worse since Sunday. Difficulty walking.

## 2023-07-16 NOTE — ED Provider Notes (Signed)
[facility] Provider Note   CSN: [id number] Arrival date & time: 07/16/23  9528     History  Chief Complaint  Patient presents with   Back Pain    Deborah Chaney is a 58 y.o. female.  Patient here with ongoing left lower back pain for the last couple of months.  Worse to the last few days.  Has been on NSAID and muscle relaxant.  Has on some steroids.  Had outpatient MRI done a couple days ago.  She is continue to have some intermittent numbness and tingling down the left leg pain mostly when she walks.  She denies any trauma.  No fever or chills.  No loss of bowel or bladder.  No pain with urination.  No abdominal pain.  She is following with a spine doctor now who ordered an MRI that was done 4 days ago.  She has another appointment here in the next week or so.  Has not ever had pain injections or surgery to the back.  Denies any history of kidney stones.  No flank pain.  No chest pain shortness of breath.  Walking makes it worse.  The history is provided by the patient.       Home Medications Prior to Admission medications   Medication Sig Start Date End Date Taking? Authorizing Provider  lidocaine (LIDODERM) 5 % Place 1 patch onto the skin daily. Remove & Discard patch within 12 hours or as directed by MD. 07/16/23  Yes Rush Salce, DO  oxyCODONE (ROXICODONE) 5 MG immediate release tablet Take 1 tablet (5 mg total) by mouth every 6 (six) hours as needed for up to 10 doses for breakthrough pain. 07/16/23  Yes Joshue Badal, DO  aspirin 81 MG EC tablet Take 1 tablet by mouth daily.    [provider]  fluconazole (DIFLUCAN) 150 MG tablet Take 1 tablet (150 mg total) by mouth daily. Take at first sign of vaginal yeast. 11/26/21   Gustavus Bryant, FNP  ibuprofen (ADVIL) 800 MG tablet Take 800 mg by mouth every 8 (eight) hours as needed.    [provider]  melatonin 5 MG TABS Take 5 mg by mouth at bedtime as needed  (sleep).    [provider]  meloxicam (MOBIC) 15 MG tablet Take 1 tablet by mouth once daily 12/31/22   Felecia Shelling, DPM  promethazine (PHENERGAN) 25 MG tablet Take 1 tablet (25 mg total) by mouth every 8 (eight) hours as needed for nausea or vomiting. 11/02/15 05/29/20  Hyatt, Max T, DPM      Allergies    Penicillins and Shrimp (diagnostic)    Review of Systems   Review of Systems  Physical Exam Updated Vital Signs BP (!) 145/88   Pulse 76   Temp 98.1 F (36.7 C)   Resp 17   SpO2 97%  Physical Exam Vitals and nursing note reviewed.  Constitutional:      General: She is not in acute distress.    Appearance: She is well-developed.  HENT:     Head: Normocephalic and atraumatic.     Nose: Nose normal.     Mouth/Throat:     Mouth: Mucous membranes are moist.  Eyes:     Extraocular Movements: Extraocular movements intact.     Conjunctiva/sclera: Conjunctivae normal.     Pupils: Pupils are equal, round, and reactive to light.  Cardiovascular:     Rate and Rhythm: Normal rate and regular  rhythm.     Pulses: Normal pulses.     Heart sounds: No murmur heard. Pulmonary:     Effort: Pulmonary effort is normal. No respiratory distress.     Breath sounds: Normal breath sounds.  Abdominal:     General: Abdomen is flat.     Palpations: Abdomen is soft.     Tenderness: There is no abdominal tenderness.  Musculoskeletal:        General: Tenderness present. No swelling. Normal range of motion.     Cervical back: Normal range of motion and neck supple. No tenderness.     Comments: Tenderness to the paraspinal lumbar muscles on the left and gluteal muscles on the left, no midline spinal tenderness  Skin:    General: Skin is warm and dry.     Capillary Refill: Capillary refill takes less than 2 seconds.  Neurological:     General: No focal deficit present.     Mental Status: She is alert and oriented to person, place, and time.     Cranial Nerves: No cranial nerve deficit.      Sensory: No sensory deficit.     Motor: No weakness.     Coordination: Coordination normal.  Psychiatric:        Mood and Affect: Mood normal.     ED Results / Procedures / Treatments   Labs (all labs ordered are listed, but only abnormal results are displayed) Labs Reviewed - No data to display  EKG None  Radiology No results found.  Procedures Procedures    Medications Ordered in ED Medications  lidocaine (LIDODERM) 5 % 1 patch (1 patch Transdermal Patch Applied 07/16/23 0813)  oxyCODONE (Oxy IR/ROXICODONE) immediate release tablet 5 mg (5 mg Oral Given 07/16/23 0814)  acetaminophen (TYLENOL) tablet 1,000 mg (1,000 mg Oral Given 07/16/23 9147)    ED Course/ Medical Decision Making/ A&P                                 Medical Decision Making Risk OTC drugs. Prescription drug management.   Deborah Chaney is here with low back pain on and off for the last couple months.  Had MRI outpatient a couple days ago that has not been read so I have called radiology to see if they can get me a stat report.  Clinically seems like this is a muscular process may be radiculopathy/sciatica.  Could be a pinched nerve herniated disc but seems less likely to be cauda equina.  Have no concern for epidural abscess or other spinal emergency.  She has been on anti-inflammatories muscle relaxant with some relief worse over the last couple days.  She has no loss of bowel or bladder.  She has normal strength and sensation.  Is having tingling down the leg on the left at times.  Is not having any flank pain.  Does not sound like kidney stone or UTI or other emergent process.  She is not having any abdominal pain.  She is neurovascular neuromuscular intact throughout.  She got good pulses on exam especially in her lower extremities.  Overall we will give her Roxicodone for breakthrough pain.  Will do Tylenol lidocaine patch as well.  Will get her a walker.  Will reevaluate.  Overall she is  feeling better.  She has been given a walker.  MRI shows some disc disease in the low back but no cauda equina or any other emergent  finding.  I discussed this with her.  She can follow-up with her spine doctor.  Discharged in good condition.  Educated about pain medicine use.  This chart was dictated using voice recognition software.  Despite best efforts to proofread,  errors can occur which can change the documentation meaning.           Final Clinical Impression(s) / ED Diagnoses Final diagnoses:  Acute left-sided low back pain with sciatica, sciatica laterality unspecified    Rx / DC Orders ED Discharge Orders          Ordered    oxyCODONE (ROXICODONE) 5 MG immediate release tablet  Every 6 hours PRN        07/16/23 0946    lidocaine (LIDODERM) 5 %  Every 24 hours        07/16/23 0947              Virgina Norfolk, DO 07/16/23 1459

## 2023-07-16 NOTE — Discharge Instructions (Addendum)
I have prescribed you Roxicodone for breakthrough pain.  Consider buying over-the-counter lidocaine patches or using prescription lidocaine patches if affordable.  Roxicodone is a narcotic pain medicine so do not mix with alcohol drugs or dangerous activities including driving.  Follow-up with your back doctor.

## 2023-08-13 ENCOUNTER — Other Ambulatory Visit (INDEPENDENT_AMBULATORY_CARE_PROVIDER_SITE_OTHER): Payer: Self-pay

## 2023-08-13 ENCOUNTER — Ambulatory Visit (INDEPENDENT_AMBULATORY_CARE_PROVIDER_SITE_OTHER): Payer: 59 | Admitting: Orthopedic Surgery

## 2023-08-13 VITALS — BP 131/84 | HR 98 | Ht 63.0 in | Wt 238.6 lb

## 2023-08-13 DIAGNOSIS — M545 Low back pain, unspecified: Secondary | ICD-10-CM

## 2023-08-13 DIAGNOSIS — M5416 Radiculopathy, lumbar region: Secondary | ICD-10-CM

## 2023-08-13 MED ORDER — PREGABALIN 75 MG PO CAPS: 75.0000 mg | ORAL_CAPSULE | Freq: Two times a day (BID) | ORAL | 0 refills | Status: DC

## 2023-08-13 NOTE — Progress Notes (Unsigned)
Orthopedic Spine Surgery Office Note  Assessment: Patient is a 58 y.o. female with low back pain that radiates into the posterior thigh and leg. Has a left-sided disc herniation at L5/S1 causing radiculopathy   Plan: -Explained that initially conservative treatment is tried as a significant number of patients may experience relief with these treatment modalities. Discussed that the conservative treatments include:  -activity modification  -physical therapy  -over the counter pain medications  -medrol dosepak  -lumbar steroid injections -Patient has tried PT, tylenol, diclofenac, tizanidine, oral steroids  -Recommended diagnostic/therapeutic injection. Prescribed lyrica for additional pain relief -Discussed weight loss as important for her overall health and as being beneficial to reduce her risk of complication should surgery ever be a treatment option -Patient should return to office in 4 weeks, x-rays at next visit: none   Patient expressed understanding of the plan and all questions were answered to the patient's satisfaction.   ___________________________________________________________________________   History:  Patient is a 58 y.o. female who presents today for lumbar spine. Patient has had about 3 months of low back pain that radiates into her left lower extremity. She feels it going along the posterior aspect of the her thigh and leg. It then radiates into the lateral aspect of her left foot. She has no pain radiating into the right lower extremity. There was no trauma or injury that preceded the onset of pain.    Weakness: denies Symptoms of imbalance: denies Paresthesias and numbness: denies Bowel or bladder incontinence: denies Saddle anesthesia: denies  Treatments tried: PT, tylenol, diclofenac, tizanidine, oral steroids   Review of systems: Denies fevers and chills, night sweats, unexplained weight loss, history of cancer, pain that wakes her at night  Past  medical history: HTN OSA Diverticulitis  Allergies: penicillin  Past surgical history:  Tubal ligation Appendectomy Hysterectomy Partial colectomy  Social history: Denies use of nicotine product (smoking, vaping, patches, smokeless) Alcohol use: denies Denies recreational drug use   Physical Exam:  BMI of 41.1  General: no acute distress, appears stated age Neurologic: alert, answering questions appropriately, following commands Respiratory: unlabored breathing on room air, symmetric chest rise Psychiatric: appropriate affect, normal cadence to speech   MSK (spine):  -Strength exam      Left  Right EHL    5/5  5/5 TA    5/5  5/5 GSC    5/5  5/5 Knee extension  5/5  5/5 Hip flexion   5/5  5/5  -Sensory exam    Sensation intact to light touch in L3-S1 nerve distributions of bilateral lower extremities  -Achilles DTR: 2/4 on the left, 2/4 on the right -Patellar tendon DTR: 2/4 on the left, 2/4 on the right  -Straight leg raise: negative bilaterally -Femoral nerve stretch test: negative bilaterally -Clonus: no beats bilaterally  -Left hip exam: no pain through range of motion, negative stinchfield, negative FABER -Right hip exam: no pain through range of motion, negative stinchfield, negative FABER  Imaging: XRs of the lumbar spine from 08/13/2023 were independently reviewed and interpreted, showing small amount of disc height loss at L5/S1. No other significant degenerative changes seen. No evidence of instability on flexion/extension views. No fracture or dislocation seen.   MRI of the lumbar spine from 07/12/2023 was independently reviewed and interpreted, showing left paracentral disc herniation with caudal migration causing lateral recess stenosis at that level. No other significant stenosis seen.    Patient name: Deborah Chaney Patient MRN: [id number] Date of visit: 08/13/23

## 2023-08-20 ENCOUNTER — Other Ambulatory Visit: Payer: Self-pay | Admitting: Orthopedic Surgery

## 2023-08-20 MED ORDER — DICLOFENAC SODIUM 75 MG PO TBEC: 75.0000 mg | DELAYED_RELEASE_TABLET | Freq: Two times a day (BID) | ORAL | 0 refills | Status: DC

## 2023-08-27 ENCOUNTER — Ambulatory Visit (INDEPENDENT_AMBULATORY_CARE_PROVIDER_SITE_OTHER): Payer: 59 | Admitting: Physical Medicine and Rehabilitation

## 2023-08-27 ENCOUNTER — Other Ambulatory Visit: Payer: Self-pay

## 2023-08-27 VITALS — BP 134/86 | HR 125

## 2023-08-27 DIAGNOSIS — M5416 Radiculopathy, lumbar region: Secondary | ICD-10-CM | POA: Diagnosis not present

## 2023-08-27 DIAGNOSIS — M5116 Intervertebral disc disorders with radiculopathy, lumbar region: Secondary | ICD-10-CM | POA: Diagnosis not present

## 2023-08-27 MED ORDER — METHYLPREDNISOLONE ACETATE 40 MG/ML IJ SUSP
40.0000 mg | Freq: Once | INTRAMUSCULAR | Status: AC
Start: 2023-08-27 — End: 2023-08-27
  Administered 2023-08-27: 40 mg

## 2023-08-27 NOTE — Progress Notes (Signed)
 Deborah Chaney - 58 y.o. female MRN [id number]  Date of birth: [birth year]  Office Visit Note: Visit Date: 08/27/2023 PCP: Deatra James, MD Referred by: London Sheer, MD  Subjective: Chief Complaint  Patient presents with   Lower Back - Pain   HPI:  Deborah Chaney is a 58 y.o. female who comes in today at the request of Dr. Willia Craze for planned Left S1-2 Lumbar Transforaminal epidural steroid injection with fluoroscopic guidance.  The patient has failed conservative care including home exercise, medications, time and activity modification.  This injection will be diagnostic and hopefully therapeutic.  Please see requesting physician notes for further details and justification.   ROS Otherwise per HPI.  Assessment & Plan: Visit Diagnoses:    ICD-10-CM   1. Radiculopathy, lumbar region  M54.16 XR C-ARM NO REPORT    Epidural Steroid injection    methylPREDNISolone acetate (DEPO-MEDROL) injection 40 mg    2. Radiculopathy due to lumbar intervertebral disc disorder  M51.16 XR C-ARM NO REPORT    Epidural Steroid injection    methylPREDNISolone acetate (DEPO-MEDROL) injection 40 mg      Plan: No additional findings.   Meds & Orders:  Meds ordered this encounter  Medications   methylPREDNISolone acetate (DEPO-MEDROL) injection 40 mg    Orders Placed This Encounter  Procedures   XR C-ARM NO REPORT   Epidural Steroid injection    Follow-up: Return for visit to requesting provider as needed.   Procedures: No procedures performed  S1 Lumbosacral Transforaminal Epidural Steroid Injection - Sub-Pedicular Approach with Fluoroscopic Guidance   Patient: Deborah Chaney      Date of Birth: Jan 09, 1966 MRN: [id number] PCP: Deatra James, MD      Visit Date: 08/27/2023   Universal Protocol:    Date/Time: 03/04/251:31 PM  Consent Given By: the patient  Position:  PRONE  Additional Comments: Vital signs were monitored before and after the procedure. Patient  was prepped and draped in the usual sterile fashion. The correct patient, procedure, and site was verified.   Injection Procedure Details:  Procedure Site One Meds Administered:  Meds ordered this encounter  Medications   methylPREDNISolone acetate (DEPO-MEDROL) injection 40 mg    Laterality: Left  Location/Site:  S1 Foramen   Needle size: 22 ga.  Needle type: Spinal  Needle Placement: Transforaminal  Findings:   -Comments: Excellent flow of contrast along the nerve, nerve root and into the epidural space.  Epidurogram: Contrast epidurogram showed no nerve root cut off or restricted flow pattern.  Procedure Details: After squaring off the sacral end-plate to get a true AP view, the C-arm was positioned so that the best possible view of the S1 foramen was visualized. The soft tissues overlying this structure were infiltrated with 2-3 ml. of 1% Lidocaine without Epinephrine.    The spinal needle was inserted toward the target using a "trajectory" view along the fluoroscope beam.  Under AP and lateral visualization, the needle was advanced so it did not puncture dura. Biplanar projections were used to confirm position. Aspiration was confirmed to be negative for CSF and/or blood. A 1-2 ml. volume of Isovue-250 was injected and flow of contrast was noted at each level. Radiographs were obtained for documentation purposes.   After attaining the desired flow of contrast documented above, a 0.5 to 1.0 ml test dose of 0.25% Marcaine was injected into each respective transforaminal space.  The patient was observed for 90 seconds post injection.  After no sensory deficits were  reported, and normal lower extremity motor function was noted,   the above injectate was administered so that equal amounts of the injectate were placed at each foramen (level) into the transforaminal epidural space.   Additional Comments:  No complications occurred Dressing: Band-Aid with 2 x 2 sterile gauze     Post-procedure details: Patient was observed during the procedure. Post-procedure instructions were reviewed.  Patient left the clinic in stable condition.   Clinical History: MRI LUMBAR SPINE WITHOUT CONTRAST   TECHNIQUE: Multiplanar, multisequence MR imaging of the lumbar spine was performed. No intravenous contrast was administered.   COMPARISON:  Multiple exams, including lumbar MRI from self [facility] dated 04/06/2008   FINDINGS: Segmentation: The lowest lumbar type non-rib-bearing vertebra is labeled as L5.   Alignment:  3 mm degenerative retrolisthesis at L5-S1.   Vertebrae: Mild disc desiccation at all levels between L2 and S1. Small hemangiomas in the L1, L3, L4 vertebral bodies. Mild type 2 degenerative endplate findings at L3-4 and L4-5.   Conus medullaris and cauda equina: Conus extends to the unremarkable level. Conus and cauda equina appear normal.   Paraspinal and other soft tissues: Unremarkable   Disc levels:   T12-L1: Unremarkable   L1-2: No impingement.  Mild degenerative right facet arthropathy.   L2-3: No impingement.  Mild disc bulge.   L3-4: Mild central narrowing of the thecal sac and mild bilateral subarticular lateral recess stenosis along with mild bilateral foraminal stenosis due to disc bulge, facet arthropathy, and small left inferior foraminal disc protrusion. Impingement worsened from prior.   L4-5: Mild central narrowing of the thecal sac along with mild left and borderline right foraminal stenosis and mild left subarticular lateral recess stenosis due to short pedicles, disc bulge, and facet arthropathy. Impingement worsened from 2009.   L5-S1: Moderate to prominent left subarticular lateral recess stenosis with borderline bilateral foraminal stenosis due to left paracentral on lateral recess disc protrusion and facet arthropathy along with mild disc bulge. Substantially worsened from prior.   IMPRESSION: 1. Lumbar  spondylosis and degenerative disc disease, causing moderate to prominent impingement at L5-S1; and mild impingement at L3-4 and L4-5. The dominant processes the left lateral recess disc protrusion at L5-S1 causing substantial impingement on the left S1 nerve roots. Impingement at all 3 levels is worsened from the 2009 MRI.     Electronically Signed   By: Gaylyn Rong M.D.   On: 07/16/2023 09:48     Objective:  VS:  HT:    WT:   BMI:     BP:134/86  HR:(!) 125bpm  TEMP: ( )  RESP:  Physical Exam Vitals and nursing note reviewed.  Constitutional:      General: She is not in acute distress.    Appearance: Normal appearance. She is obese. She is not ill-appearing.  HENT:     Head: Normocephalic and atraumatic.     Right Ear: External ear normal.     Left Ear: External ear normal.  Eyes:     Extraocular Movements: Extraocular movements intact.  Cardiovascular:     Rate and Rhythm: Normal rate.     Pulses: Normal pulses.  Pulmonary:     Effort: Pulmonary effort is normal. No respiratory distress.  Abdominal:     General: There is no distension.     Palpations: Abdomen is soft.  Musculoskeletal:        General: Tenderness present.     Cervical back: Neck supple.     Right lower leg: No  edema.     Left lower leg: No edema.     Comments: Patient has good distal strength with no pain over the greater trochanters.  No clonus or focal weakness.  Skin:    Findings: No erythema, lesion or rash.  Neurological:     General: No focal deficit present.     Mental Status: She is alert and oriented to person, place, and time.     Sensory: No sensory deficit.     Motor: No weakness or abnormal muscle tone.     Coordination: Coordination normal.  Psychiatric:        Mood and Affect: Mood normal.        Behavior: Behavior normal.      Imaging: No results found.

## 2023-08-27 NOTE — Patient Instructions (Signed)

## 2023-08-27 NOTE — Progress Notes (Signed)
 Pain Scale---9 No Contrast Dye Allergies No Thinners

## 2023-08-27 NOTE — Procedures (Signed)
 S1 Lumbosacral Transforaminal Epidural Steroid Injection - Sub-Pedicular Approach with Fluoroscopic Guidance   Patient: Deborah Chaney      Date of Birth: [birth year] MRN: [id number] PCP: Deatra James, MD      Visit Date: 08/27/2023   Universal Protocol:    Date/Time: 03/04/251:31 PM  Consent Given By: the patient  Position:  PRONE  Additional Comments: Vital signs were monitored before and after the procedure. Patient was prepped and draped in the usual sterile fashion. The correct patient, procedure, and site was verified.   Injection Procedure Details:  Procedure Site One Meds Administered:  Meds ordered this encounter  Medications   methylPREDNISolone acetate (DEPO-MEDROL) injection 40 mg    Laterality: Left  Location/Site:  S1 Foramen   Needle size: 22 ga.  Needle type: Spinal  Needle Placement: Transforaminal  Findings:   -Comments: Excellent flow of contrast along the nerve, nerve root and into the epidural space.  Epidurogram: Contrast epidurogram showed no nerve root cut off or restricted flow pattern.  Procedure Details: After squaring off the sacral end-plate to get a true AP view, the C-arm was positioned so that the best possible view of the S1 foramen was visualized. The soft tissues overlying this structure were infiltrated with 2-3 ml. of 1% Lidocaine without Epinephrine.    The spinal needle was inserted toward the target using a "trajectory" view along the fluoroscope beam.  Under AP and lateral visualization, the needle was advanced so it did not puncture dura. Biplanar projections were used to confirm position. Aspiration was confirmed to be negative for CSF and/or blood. A 1-2 ml. volume of Isovue-250 was injected and flow of contrast was noted at each level. Radiographs were obtained for documentation purposes.   After attaining the desired flow of contrast documented above, a 0.5 to 1.0 ml test dose of 0.25% Marcaine was injected into each  respective transforaminal space.  The patient was observed for 90 seconds post injection.  After no sensory deficits were reported, and normal lower extremity motor function was noted,   the above injectate was administered so that equal amounts of the injectate were placed at each foramen (level) into the transforaminal epidural space.   Additional Comments:  No complications occurred Dressing: Band-Aid with 2 x 2 sterile gauze    Post-procedure details: Patient was observed during the procedure. Post-procedure instructions were reviewed.  Patient left the clinic in stable condition.

## 2023-09-12 ENCOUNTER — Ambulatory Visit: Payer: 59 | Admitting: Orthopedic Surgery

## 2023-09-12 DIAGNOSIS — M5416 Radiculopathy, lumbar region: Secondary | ICD-10-CM

## 2023-09-12 NOTE — Progress Notes (Signed)
 Orthopedic Spine Surgery Office Note   Assessment: Patient is a 58 y.o. female with low back pain that radiates into the posterior thigh and leg. Has a left-sided disc herniation at L5/S1 causing radiculopathy     Plan: -Patient has tried tylenol, diclofenac, tizanidine, oral steroids, lyrica, lumbar steroid injection -Patient has responded well to a steroid injection, so could repeat in the future if pain returns -Since pain is under better control, I feel that she would be able to better for stay with PT.  Referral provided to her today -Should continue to work on weight loss -Patient should return to office in 7-8 weeks, x-rays at next visit: none     Patient expressed understanding of the plan and all questions were answered to the patient's satisfaction.    ___________________________________________________________________________     History:   Patient is a 58 y.o. female who presents today for follow-up on her lumbar spine.  Patient states that she is doing much better since she was last seen.  She said she got her injection with Dr. Alvester Morin and noticed about 70% relief of her pain.  She still has pain going down the posterior aspect of her thigh and leg on the left side, but it is not as bad.  She also has paresthesias in that same distribution.  No symptoms in the right lower extremity.  She said she is walking better and is able to do more since getting that injection.    Treatments tried: tylenol, diclofenac, tizanidine, oral steroids, lyrica, lumbar steroid injection     Physical Exam:   General: no acute distress, appears stated age Neurologic: alert, answering questions appropriately, following commands Respiratory: unlabored breathing on room air, symmetric chest rise Psychiatric: appropriate affect, normal cadence to speech     MSK (spine):   -Strength exam                                                   Left                  Right EHL                               5/5                  5/5 TA                                 5/5                  5/5 GSC                             5/5                  5/5 Knee extension            5/5                  5/5 Hip flexion                    5/5  5/5   -Sensory exam                           Sensation intact to light touch in L3-S1 nerve distributions of bilateral lower extremities     Imaging: XRs of the lumbar spine from 08/13/2023 were previously independently reviewed and interpreted, showing small amount of disc height loss at L5/S1. No other significant degenerative changes seen. No evidence of instability on flexion/extension views. No fracture or dislocation seen.    MRI of the lumbar spine from 07/12/2023 was previously independently reviewed and interpreted, showing left paracentral disc herniation with caudal migration causing lateral recess stenosis at that level. No other significant stenosis seen.      Patient name: Deborah Chaney Patient MRN: [id number] Date of visit: 09/12/23

## 2023-09-16 ENCOUNTER — Other Ambulatory Visit: Payer: Self-pay | Admitting: Orthopedic Surgery

## 2023-10-01 ENCOUNTER — Encounter: Payer: Self-pay | Admitting: Orthopedic Surgery

## 2023-10-01 DIAGNOSIS — M5416 Radiculopathy, lumbar region: Secondary | ICD-10-CM

## 2023-10-04 ENCOUNTER — Telehealth: Payer: Self-pay

## 2023-10-04 NOTE — Telephone Encounter (Signed)
 Patient would like to have a Valium prior to injection on the 23 of April

## 2023-10-07 ENCOUNTER — Other Ambulatory Visit: Payer: Self-pay | Admitting: Physical Medicine and Rehabilitation

## 2023-10-07 MED ORDER — DIAZEPAM 5 MG PO TABS: ORAL_TABLET | ORAL | 0 refills | Status: AC

## 2023-10-08 NOTE — Therapy (Signed)
 OUTPATIENT PHYSICAL THERAPY LUMBAR EVALUATION   Patient Name: Deborah Chaney MRN: [id number] DOB:23-May-1966, 58 y.o., female Today's Date: 10/09/2023  END OF SESSION:  PT End of Session - 10/09/23 0839     Visit Number 1    Number of Visits 12    Date for PT Re-Evaluation 12/18/23    PT Start Time 0800    PT Stop Time 0847    PT Time Calculation (min) 47 min    Activity Tolerance Patient tolerated treatment well    Behavior During Therapy WFL for tasks assessed/performed             Past Medical History:  Diagnosis Date   Back pain    Bilateral swelling of feet    DDD (degenerative disc disease), lumbar    Diverticulitis    Headache    Hypertension    Joint pain    Multiple food allergies    Ovarian cyst    PONV (postoperative nausea and vomiting)    Sickle cell disease (HCC)    Vitamin D deficiency    Past Surgical History:  Procedure Laterality Date   ABDOMINAL HYSTERECTOMY  2008   APPENDECTOMY  1982   CESAREAN SECTION  1993   COLON RESECTION SIGMOID N/A 08/08/2020   Procedure: OPEN SIGMOID COLECTOMY;  Surgeon: Quentin Ore, MD;  Location: [facility];  Service: General;  Laterality: N/A;   FOOT SURGERY  2008   LAPAROSCOPIC OVARIAN CYSTECTOMY  2004   TUBAL LIGATION  1995   Patient Active Problem List   Diagnosis Date Noted   Diverticulitis of intestine with abscess 08/08/2020   Diverticulitis of colon with perforation 06/20/2020   Sepsis (HCC) 06/20/2020   Obesity, Class III, BMI 40-49.9 (morbid obesity) (HCC) 06/20/2020   Breast lump 02/21/2017   Cyst of ovary 02/21/2017   Lower abdominal pain 02/21/2017   Obesity 02/21/2017   Upper respiratory infection 02/21/2017    PCP: Deatra James, MD   REFERRING PROVIDER: London Sheer, MD   REFERRING DIAG: M54.16 (ICD-10-CM) - Radiculopathy, lumbar region  Rationale for Evaluation and Treatment: Rehabilitation  THERAPY DIAG:  Other low back pain  Pain in left hip  Muscle weakness  (generalized)  ONSET DATE: back pain since Novemember 2025  SUBJECTIVE:                                                                                                                                                                                           SUBJECTIVE STATEMENT: Back pain since November 2025 without known MOI, she reports she was told she has herniated disc. She has had injections which have helped  some. She will have another injection 10/16/23. She also complains of pain in her left buttock and hip  PERTINENT HISTORY:  left-sided disc herniation at L5/S1 causing radiculopathy   PAIN:  NPRS scale: 5-6/10 today Pain location:left hip/buttock/lumbar Pain description: constant, can be sharp or dull, pulling sensation, N/T, burning Aggravating factors: first thing in the morning is worse, toileting, laying on left side, putting on socks.  Relieving factors: propping up, bending over on head, heat did not help and maybe even worse, vicks vapor rub   PRECAUTIONS: None  RED FLAGS: None   WEIGHT BEARING RESTRICTIONS: No  FALLS:  Has patient fallen in last 6 months? Yes. Number of falls 1, due to pain, she does not have fear of falling, relays her balance is good  OCCUPATION: court Programme researcher, broadcasting/film/video  PLOF: Independent  PATIENT GOALS: reduce pain  NEXT MD VISIT: 10/30/23  OBJECTIVE:  Note: Objective measures were completed at Evaluation unless otherwise noted.  DIAGNOSTIC FINDINGS:  "MRI of the lumbar spine from 07/12/2023 was previously independently reviewed and interpreted, showing left paracentral disc herniation with caudal migration causing lateral recess stenosis at that level. No other significant stenosis seen."  PATIENT SURVEYS:  Patient-Specific Activity Scoring Scheme  "0" represents "unable to perform." "10" represents "able to perform at prior level. 0 1 2 3 4 5 6 7 8 9  10 (Date and Score)   Activity Eval     1. walking  4    2. siiting  4    3.  Driving  4   4.exercising 2       Score 16/40    Total score = sum of the activity scores/number of activities Minimum detectable change (90%CI) for average score = 2 points Minimum detectable change (90%CI) for single activity score = 3 points     SENSATION: WFL  LUMBAR ROM:   AROM eval  Flexion 25%  Extension 75%  Right lateral flexion 50%  Left lateral flexion 25%  Right rotation 50%  Left rotation 50%   (Blank rows = not tested)   LOWER EXTREMITY MMT:    MMT Right eval Left eval  Hip flexion  4  Hip extension  4  Hip abduction  4  Hip adduction    Hip internal rotation    Hip external rotation    Knee flexion  4  Knee extension  4  Ankle dorsiflexion    Ankle plantarflexion    Ankle inversion    Ankle eversion     (Blank rows = not tested)  LUMBAR SPECIAL TESTS:  Eval: Direction preference: pain with repeated flexion, no change with repeated extension 10 reps each  FUNCTIONAL TESTS:  Eval: Sit to stand: must use UE support and +gowers sign   TODAY'S TREATMENT:  Eval HEP creation and review with demonstration and trial set preformed, see below for details Mechanical lumbar traction 85-75# intermittent X 15 minutes    PATIENT EDUCATION: Education details: HEP, PT plan of care Person educated: Patient Education method: Explanation, Demonstration, Verbal cues, and Handouts Education comprehension: verbalized understanding and needs further education   HOME EXERCISE PROGRAM: Access Code: [email or web address] URL: [email or web address] Date: 10/09/2023 Prepared by: Ivery Quale  Exercises - Standing Lumbar Extension at Wall - Forearms  - 2 x daily - 6 x weekly - 1-2 sets - 10 reps - 3 sec hold - Standing Quadratus Lumborum Stretch with Doorway  - 2 x daily - 6 x weekly - 1 sets - 5 reps - 10 hold -  Supine Bridge  - 2 x daily - 6 x weekly - 1 sets - 10 reps - 2-3 sec hold - Supine Piriformis Stretch with Leg Straight (Mirrored)  - 2 x daily -  6 x weekly - 1 sets - 3 reps - 20 sec hold - Hooklying Lumbar Traction  - 2 x daily - 6 x weekly - 1 sets - 10 reps - 10 sec hold  ASSESSMENT:  CLINICAL IMPRESSION: Patient referred to PT for low back pain with left radiculopathy. She has a left-sided disc herniation at L5/S1.  Patient will benefit from skilled PT to address below impairments, limitations and improve overall function.  OBJECTIVE IMPAIRMENTS: decreased activity tolerance, difficulty walking, decreased mobility, decreased ROM, decreased strength, impaired flexibility, impaired LE use, and pain.  ACTIVITY LIMITATIONS: bending, lifting, carry, locomotion, cleaning, community activity, driving, and or occupation  PERSONAL FACTORS: see above PMH, are also affecting patient's functional outcome.  REHAB POTENTIAL: Good  CLINICAL DECISION MAKING: Stable/uncomplicated  EVALUATION COMPLEXITY: Low    GOALS: Short term PT Goals Target date: 11/06/2023   Pt will be I and compliant with HEP. Baseline:  Goal status: New Pt will decrease pain by 25% overall Baseline: Goal status: New  Long term PT goals Target date:12/18/2023   Pt will improve lumbar AROM to [facility] to improve functional mobility Baseline: Goal status: New Pt will improve  left hip/knee strength to at least 4+/5 MMT to improve functional strength Baseline: Goal status: New Pt will improve PSFS to at least 25/40 functional to show improved function Baseline: Goal status: New Pt will reduce pain to overall less than 3/10 with usual activity and work activity. Baseline: Goal status: New Pt will be able to ambulate community distances at least 1000 ft WNL gait pattern without complaints Baseline: Goal status: New  PLAN: PT FREQUENCY: 1-3 times per week   PT DURATION: 6-10 weeks  PLANNED INTERVENTIONS (unless contraindicated): aquatic PT, Canalith repositioning, cryotherapy, Electrical stimulation, Iontophoresis with 4 mg/ml dexamethasome, Moist heat,  traction, Ultrasound, gait training, Therapeutic exercise, balance training, neuromuscular re-education, patient/family education, manual techniques, passive ROM, dry needling, taping, vasopnuematic device, vestibular, spinal manipulations, joint manipulations 97110-Therapeutic exercises, 97530- Therapeutic activity, [id number]- Neuromuscular re-education, 97535- Self Care, 95621- Manual therapy, [id number]- Electrical stimulation (unattended), and 97012- Traction (mechanical)  PLAN FOR NEXT SESSION: how was traction? Review and update HEP PRN.    Mick Alamin, PT,DPT 10/09/2023, 8:40 AM

## 2023-10-09 ENCOUNTER — Ambulatory Visit: Admitting: Physical Therapy

## 2023-10-09 ENCOUNTER — Encounter: Payer: Self-pay | Admitting: Physical Therapy

## 2023-10-09 DIAGNOSIS — M5459 Other low back pain: Secondary | ICD-10-CM

## 2023-10-09 DIAGNOSIS — M6281 Muscle weakness (generalized): Secondary | ICD-10-CM

## 2023-10-09 DIAGNOSIS — M25552 Pain in left hip: Secondary | ICD-10-CM | POA: Diagnosis not present

## 2023-10-15 ENCOUNTER — Encounter: Payer: Self-pay | Admitting: Physical Therapy

## 2023-10-15 ENCOUNTER — Ambulatory Visit (INDEPENDENT_AMBULATORY_CARE_PROVIDER_SITE_OTHER): Payer: Self-pay | Admitting: Physical Therapy

## 2023-10-15 DIAGNOSIS — M25552 Pain in left hip: Secondary | ICD-10-CM

## 2023-10-15 DIAGNOSIS — M5459 Other low back pain: Secondary | ICD-10-CM

## 2023-10-15 DIAGNOSIS — M6281 Muscle weakness (generalized): Secondary | ICD-10-CM | POA: Diagnosis not present

## 2023-10-15 NOTE — Therapy (Signed)
 OUTPATIENT PHYSICAL THERAPY TREATMENT   Patient Name: Deborah Chaney MRN: [id number] DOB:[birth year], 58 y.o., female Today's Date: 10/15/2023  END OF SESSION:  PT End of Session - 10/15/23 1016     Visit Number 2    Number of Visits 12    Date for PT Re-Evaluation 12/18/23    PT Start Time 1016    PT Stop Time 1105    PT Time Calculation (min) 49 min    Activity Tolerance Patient tolerated treatment well    Behavior During Therapy WFL for tasks assessed/performed              Past Medical History:  Diagnosis Date   Back pain    Bilateral swelling of feet    DDD (degenerative disc disease), lumbar    Diverticulitis    Headache    Hypertension    Joint pain    Multiple food allergies    Ovarian cyst    PONV (postoperative nausea and vomiting)    Sickle cell disease (HCC)    Vitamin D deficiency    Past Surgical History:  Procedure Laterality Date   ABDOMINAL HYSTERECTOMY  2008   APPENDECTOMY  1982   CESAREAN SECTION  1993   COLON RESECTION SIGMOID N/A 08/08/2020   Procedure: OPEN SIGMOID COLECTOMY;  Surgeon: Junie Olds, MD;  Location: [facility];  Service: General;  Laterality: N/A;   FOOT SURGERY  2008   LAPAROSCOPIC OVARIAN CYSTECTOMY  2004   TUBAL LIGATION  1995   Patient Active Problem List   Diagnosis Date Noted   Diverticulitis of intestine with abscess 08/08/2020   Diverticulitis of colon with perforation 06/20/2020   Sepsis (HCC) 06/20/2020   Obesity, Class III, BMI 40-49.9 (morbid obesity) (HCC) 06/20/2020   Breast lump 02/21/2017   Cyst of ovary 02/21/2017   Lower abdominal pain 02/21/2017   Obesity 02/21/2017   Upper respiratory infection 02/21/2017    PCP: Sun, Vyvyan, MD   REFERRING PROVIDER: Diedra Fowler, MD   REFERRING DIAG: M54.16 (ICD-10-CM) - Radiculopathy, lumbar region  Rationale for Evaluation and Treatment: Rehabilitation  THERAPY DIAG:  Other low back pain  Pain in left hip  Muscle weakness  (generalized)  ONSET DATE: back pain since Novemember 2025  SUBJECTIVE:                                                                                                                                                                                           SUBJECTIVE STATEMENT: Traction was really helpful; felt good for several days.  Pain is worse in the morning (about 5-9/10), then by midday pain returns to  5/10.   PERTINENT HISTORY:  left-sided disc herniation at L5/S1 causing radiculopathy   PAIN:  NPRS scale: 5-6/10 today Pain location:left hip/buttock/lumbar Pain description: constant, can be sharp or dull, pulling sensation, N/T, burning Aggravating factors: first thing in the morning is worse, toileting, laying on left side, putting on socks.  Relieving factors: propping up, bending over on head, heat did not help and maybe even worse, vicks vapor rub   PRECAUTIONS: None  RED FLAGS: None   WEIGHT BEARING RESTRICTIONS: No  FALLS:  Has patient fallen in last 6 months? Yes. Number of falls 1, due to pain, she does not have fear of falling, relays her balance is good  OCCUPATION: court Programme researcher, broadcasting/film/video  PLOF: Independent  PATIENT GOALS: reduce pain  NEXT MD VISIT: 10/30/23  OBJECTIVE:  Note: Objective measures were completed at Evaluation unless otherwise noted.  DIAGNOSTIC FINDINGS:  "MRI of the lumbar spine from 07/12/2023 was previously independently reviewed and interpreted, showing left paracentral disc herniation with caudal migration causing lateral recess stenosis at that level. No other significant stenosis seen."  PATIENT SURVEYS:  Patient-Specific Activity Scoring Scheme  "0" represents "unable to perform." "10" represents "able to perform at prior level. 0 1 2 3 4 5 6 7 8 9  10 (Date and Score)   Activity Eval     1. walking  4    2. siiting  4    3. Driving  4   4.exercising 2       Score 16/40    Total score = sum of the activity  scores/number of activities Minimum detectable change (90%CI) for average score = 2 points Minimum detectable change (90%CI) for single activity score = 3 points     SENSATION: WFL  LUMBAR ROM:   AROM eval  Flexion 25%  Extension 75%  Right lateral flexion 50%  Left lateral flexion 25%  Right rotation 50%  Left rotation 50%   (Blank rows = not tested)   LOWER EXTREMITY MMT:    MMT Right eval Left eval  Hip flexion  4  Hip extension  4  Hip abduction  4  Hip adduction    Hip internal rotation    Hip external rotation    Knee flexion  4  Knee extension  4  Ankle dorsiflexion    Ankle plantarflexion    Ankle inversion    Ankle eversion     (Blank rows = not tested)  LUMBAR SPECIAL TESTS:  Eval: Direction preference: pain with repeated flexion, no change with repeated extension 10 reps each  FUNCTIONAL TESTS:  Eval: Sit to stand: must use UE support and +gowers sign   TODAY'S TREATMENT:  10/15/23 TherEx NuStep L7 x 8 min Standing lumbar extension x 10 reps Standing Lt QL stretch 5x10 sec hold Supine bridges x10 reps; 3-5 sec hold Supine piriformis stretch 3x20 sec hold  Modalities Mechanical lumbar traction 85-75# intermittent X 12 minutes   Eval HEP creation and review with demonstration and trial set preformed, see below for details Mechanical lumbar traction 85-75# intermittent X 15 minutes    PATIENT EDUCATION: Education details: HEP, PT plan of care Person educated: Patient Education method: Explanation, Demonstration, Verbal cues, and Handouts Education comprehension: verbalized understanding and needs further education   HOME EXERCISE PROGRAM: Access Code: [email or web address] URL: [email or web address] Date: 10/09/2023 Prepared by: Jamee Mazzoni  Exercises - Standing Lumbar Extension at Wall - Forearms  - 2 x daily - 6 x weekly - 1-2 sets - 10 reps -  3 sec hold - Standing Quadratus Lumborum Stretch with Doorway  - 2 x daily - 6 x  weekly - 1 sets - 5 reps - 10 hold - Supine Bridge  - 2 x daily - 6 x weekly - 1 sets - 10 reps - 2-3 sec hold - Supine Piriformis Stretch with Leg Straight (Mirrored)  - 2 x daily - 6 x weekly - 1 sets - 3 reps - 20 sec hold - Hooklying Lumbar Traction  - 2 x daily - 6 x weekly - 1 sets - 10 reps - 10 sec hold  ASSESSMENT:  CLINICAL IMPRESSION:  Pt tolerated session well today with positive response from traction reported last visit.  Min cues needed with review of HEP today.  Will continue to benefit from PT to maximize function.  OBJECTIVE IMPAIRMENTS: decreased activity tolerance, difficulty walking, decreased mobility, decreased ROM, decreased strength, impaired flexibility, impaired LE use, and pain.  ACTIVITY LIMITATIONS: bending, lifting, carry, locomotion, cleaning, community activity, driving, and or occupation  PERSONAL FACTORS: see above PMH, are also affecting patient's functional outcome.  REHAB POTENTIAL: Good  CLINICAL DECISION MAKING: Stable/uncomplicated  EVALUATION COMPLEXITY: Low    GOALS: Short term PT Goals Target date: 11/06/2023   Pt will be I and compliant with HEP. Baseline:  Goal status: New Pt will decrease pain by 25% overall Baseline: Goal status: New  Long term PT goals Target date:12/18/2023   Pt will improve lumbar AROM to [facility] to improve functional mobility Baseline: Goal status: New Pt will improve  left hip/knee strength to at least 4+/5 MMT to improve functional strength Baseline: Goal status: New Pt will improve PSFS to at least 25/40 functional to show improved function Baseline: Goal status: New Pt will reduce pain to overall less than 3/10 with usual activity and work activity. Baseline: Goal status: New Pt will be able to ambulate community distances at least 1000 ft WNL gait pattern without complaints Baseline: Goal status: New  PLAN: PT FREQUENCY: 1-3 times per week   PT DURATION: 6-10 weeks  PLANNED INTERVENTIONS  (unless contraindicated): aquatic PT, Canalith repositioning, cryotherapy, Electrical stimulation, Iontophoresis with 4 mg/ml dexamethasome, Moist heat, traction, Ultrasound, gait training, Therapeutic exercise, balance training, neuromuscular re-education, patient/family education, manual techniques, passive ROM, dry needling, taping, vasopnuematic device, vestibular, spinal manipulations, joint manipulations 97110-Therapeutic exercises, 97530- Therapeutic activity, [id number]- Neuromuscular re-education, 97535- Self Care, 16109- Manual therapy, [id number]- Electrical stimulation (unattended), and 97012- Traction (mechanical)  PLAN FOR NEXT SESSION: how was injection, repeat traction PRN, Review and update HEP PRN.    Marley Simmers, PT, DPT 10/15/23 12:14 PM

## 2023-10-16 ENCOUNTER — Other Ambulatory Visit: Payer: Self-pay | Admitting: Orthopedic Surgery

## 2023-10-16 ENCOUNTER — Other Ambulatory Visit: Payer: Self-pay

## 2023-10-16 ENCOUNTER — Ambulatory Visit: Admitting: Physical Medicine and Rehabilitation

## 2023-10-16 DIAGNOSIS — M5416 Radiculopathy, lumbar region: Secondary | ICD-10-CM | POA: Diagnosis not present

## 2023-10-16 MED ORDER — METHYLPREDNISOLONE ACETATE 40 MG/ML IJ SUSP
40.0000 mg | Freq: Once | INTRAMUSCULAR | Status: AC
  Administered 2023-10-16: 40 mg

## 2023-10-16 NOTE — Patient Instructions (Signed)

## 2023-10-16 NOTE — Progress Notes (Signed)
 Pain Scale   Average Pain 6 Patient advised she has increased pain when standing and sitting for prolonged times, does advise she gets relive when walking         +Driver, -BT, -Dye Allergies.

## 2023-10-22 NOTE — Procedures (Signed)
 S1 Lumbosacral Transforaminal Epidural Steroid Injection - Sub-Pedicular Approach with Fluoroscopic Guidance   Patient: Deborah Chaney      Date of Birth: [birth year] MRN: [id number] PCP: Sun, Vyvyan, MD      Visit Date: 10/16/2023   Universal Protocol:    Date/Time: 04/29/258:40 AM  Consent Given By: the patient  Position:  PRONE  Additional Comments: Vital signs were monitored before and after the procedure. Patient was prepped and draped in the usual sterile fashion. The correct patient, procedure, and site was verified.   Injection Procedure Details:  Procedure Site One Meds Administered:  Meds ordered this encounter  Medications   methylPREDNISolone  acetate (DEPO-MEDROL ) injection 40 mg    Laterality: Left  Location/Site:  S1 Foramen   Needle size: 22 ga.  Needle type: Spinal  Needle Placement: Transforaminal  Findings:   -Comments: Excellent flow of contrast along the nerve, nerve root and into the epidural space.  Epidurogram: Contrast epidurogram showed no nerve root cut off or restricted flow pattern.  Procedure Details: After squaring off the sacral end-plate to get a true AP view, the C-arm was positioned so that the best possible view of the S1 foramen was visualized. The soft tissues overlying this structure were infiltrated with 2-3 ml. of 1% Lidocaine  without Epinephrine.    The spinal needle was inserted toward the target using a "trajectory" view along the fluoroscope beam.  Under AP and lateral visualization, the needle was advanced so it did not puncture dura. Biplanar projections were used to confirm position. Aspiration was confirmed to be negative for CSF and/or blood. A 1-2 ml. volume of Isovue -250 was injected and flow of contrast was noted at each level. Radiographs were obtained for documentation purposes.   After attaining the desired flow of contrast documented above, a 0.5 to 1.0 ml test dose of 0.25% Marcaine  was injected into each  respective transforaminal space.  The patient was observed for 90 seconds post injection.  After no sensory deficits were reported, and normal lower extremity motor function was noted,   the above injectate was administered so that equal amounts of the injectate were placed at each foramen (level) into the transforaminal epidural space.   Additional Comments:  The patient tolerated the procedure well Dressing: Band-Aid with 2 x 2 sterile gauze    Post-procedure details: Patient was observed during the procedure. Post-procedure instructions were reviewed.  Patient left the clinic in stable condition.

## 2023-10-22 NOTE — Progress Notes (Signed)
 Deborah Chaney - 58 y.o. female MRN [id number]  Date of birth: 08-19-1965  Office Visit Note: Visit Date: 10/16/2023 PCP: Sun, Vyvyan, MD Referred by: Diedra Fowler, MD  Subjective: Chief Complaint  Patient presents with   Lower Back - Pain   HPI:  Deborah Chaney is a 58 y.o. female who comes in today for planned repeat Left S1-2  Lumbar Transforaminal epidural steroid injection with fluoroscopic guidance.  The patient has failed conservative care including home exercise, medications, time and activity modification.  This injection will be diagnostic and hopefully therapeutic.  Please see requesting physician notes for further details and justification. Patient received more than 50% pain relief from prior injection.   Referring: Dr. Colette Davies   ROS Otherwise per HPI.  Assessment & Plan: Visit Diagnoses:    ICD-10-CM   1. Radiculopathy, lumbar region  M54.16 XR C-ARM NO REPORT    Epidural Steroid injection    methylPREDNISolone  acetate (DEPO-MEDROL ) injection 40 mg      Plan: No additional findings.   Meds & Orders:  Meds ordered this encounter  Medications   methylPREDNISolone  acetate (DEPO-MEDROL ) injection 40 mg    Orders Placed This Encounter  Procedures   XR C-ARM NO REPORT   Epidural Steroid injection    Follow-up: Return for visit to requesting provider as needed.   Procedures: No procedures performed  S1 Lumbosacral Transforaminal Epidural Steroid Injection - Sub-Pedicular Approach with Fluoroscopic Guidance   Patient: Deborah Chaney      Date of Birth: Jan 11, 1966 MRN: [id number] PCP: Sun, Vyvyan, MD      Visit Date: 10/16/2023   Universal Protocol:    Date/Time: 04/29/258:40 AM  Consent Given By: the patient  Position:  PRONE  Additional Comments: Vital signs were monitored before and after the procedure. Patient was prepped and draped in the usual sterile fashion. The correct patient, procedure, and site was  verified.   Injection Procedure Details:  Procedure Site One Meds Administered:  Meds ordered this encounter  Medications   methylPREDNISolone  acetate (DEPO-MEDROL ) injection 40 mg    Laterality: Left  Location/Site:  S1 Foramen   Needle size: 22 ga.  Needle type: Spinal  Needle Placement: Transforaminal  Findings:   -Comments: Excellent flow of contrast along the nerve, nerve root and into the epidural space.  Epidurogram: Contrast epidurogram showed no nerve root cut off or restricted flow pattern.  Procedure Details: After squaring off the sacral end-plate to get a true AP view, the C-arm was positioned so that the best possible view of the S1 foramen was visualized. The soft tissues overlying this structure were infiltrated with 2-3 ml. of 1% Lidocaine  without Epinephrine.    The spinal needle was inserted toward the target using a "trajectory" view along the fluoroscope beam.  Under AP and lateral visualization, the needle was advanced so it did not puncture dura. Biplanar projections were used to confirm position. Aspiration was confirmed to be negative for CSF and/or blood. A 1-2 ml. volume of Isovue -250 was injected and flow of contrast was noted at each level. Radiographs were obtained for documentation purposes.   After attaining the desired flow of contrast documented above, a 0.5 to 1.0 ml test dose of 0.25% Marcaine  was injected into each respective transforaminal space.  The patient was observed for 90 seconds post injection.  After no sensory deficits were reported, and normal lower extremity motor function was noted,   the above injectate was administered so that equal amounts of  the injectate were placed at each foramen (level) into the transforaminal epidural space.   Additional Comments:  The patient tolerated the procedure well Dressing: Band-Aid with 2 x 2 sterile gauze    Post-procedure details: Patient was observed during the  procedure. Post-procedure instructions were reviewed.  Patient left the clinic in stable condition.   Clinical History: MRI LUMBAR SPINE WITHOUT CONTRAST   TECHNIQUE: Multiplanar, multisequence MR imaging of the lumbar spine was performed. No intravenous contrast was administered.   COMPARISON:  Multiple exams, including lumbar MRI from self [facility] dated 04/06/2008   FINDINGS: Segmentation: The lowest lumbar type non-rib-bearing vertebra is labeled as L5.   Alignment:  3 mm degenerative retrolisthesis at L5-S1.   Vertebrae: Mild disc desiccation at all levels between L2 and S1. Small hemangiomas in the L1, L3, L4 vertebral bodies. Mild type 2 degenerative endplate findings at L3-4 and L4-5.   Conus medullaris and cauda equina: Conus extends to the unremarkable level. Conus and cauda equina appear normal.   Paraspinal and other soft tissues: Unremarkable   Disc levels:   T12-L1: Unremarkable   L1-2: No impingement.  Mild degenerative right facet arthropathy.   L2-3: No impingement.  Mild disc bulge.   L3-4: Mild central narrowing of the thecal sac and mild bilateral subarticular lateral recess stenosis along with mild bilateral foraminal stenosis due to disc bulge, facet arthropathy, and small left inferior foraminal disc protrusion. Impingement worsened from prior.   L4-5: Mild central narrowing of the thecal sac along with mild left and borderline right foraminal stenosis and mild left subarticular lateral recess stenosis due to short pedicles, disc bulge, and facet arthropathy. Impingement worsened from 2009.   L5-S1: Moderate to prominent left subarticular lateral recess stenosis with borderline bilateral foraminal stenosis due to left paracentral on lateral recess disc protrusion and facet arthropathy along with mild disc bulge. Substantially worsened from prior.   IMPRESSION: 1. Lumbar spondylosis and degenerative disc disease, causing moderate  to prominent impingement at L5-S1; and mild impingement at L3-4 and L4-5. The dominant processes the left lateral recess disc protrusion at L5-S1 causing substantial impingement on the left S1 nerve roots. Impingement at all 3 levels is worsened from the 2009 MRI.     Electronically Signed   By: Freida Jes M.D.   On: 07/16/2023 09:48     Objective:  VS:  HT:    WT:   BMI:     BP:   HR: bpm  TEMP: ( )  RESP:  Physical Exam Vitals and nursing note reviewed.  Constitutional:      General: She is not in acute distress.    Appearance: Normal appearance. She is obese. She is not ill-appearing.  HENT:     Head: Normocephalic and atraumatic.     Right Ear: External ear normal.     Left Ear: External ear normal.  Eyes:     Extraocular Movements: Extraocular movements intact.  Cardiovascular:     Rate and Rhythm: Normal rate.     Pulses: Normal pulses.  Pulmonary:     Effort: Pulmonary effort is normal. No respiratory distress.  Abdominal:     General: There is no distension.     Palpations: Abdomen is soft.  Musculoskeletal:        General: Tenderness present.     Cervical back: Neck supple.     Right lower leg: No edema.     Left lower leg: No edema.     Comments: Patient has  good distal strength with no pain over the greater trochanters.  No clonus or focal weakness.  Skin:    Findings: No erythema, lesion or rash.  Neurological:     General: No focal deficit present.     Mental Status: She is alert and oriented to person, place, and time.     Sensory: No sensory deficit.     Motor: No weakness or abnormal muscle tone.     Coordination: Coordination normal.  Psychiatric:        Mood and Affect: Mood normal.        Behavior: Behavior normal.      Imaging: No results found.

## 2023-10-23 ENCOUNTER — Encounter: Payer: Self-pay | Admitting: Physical Therapy

## 2023-10-23 ENCOUNTER — Ambulatory Visit (INDEPENDENT_AMBULATORY_CARE_PROVIDER_SITE_OTHER): Admitting: Physical Therapy

## 2023-10-23 DIAGNOSIS — M25552 Pain in left hip: Secondary | ICD-10-CM

## 2023-10-23 DIAGNOSIS — M5459 Other low back pain: Secondary | ICD-10-CM

## 2023-10-23 DIAGNOSIS — M6281 Muscle weakness (generalized): Secondary | ICD-10-CM | POA: Diagnosis not present

## 2023-10-23 NOTE — Therapy (Signed)
 OUTPATIENT PHYSICAL THERAPY TREATMENT   Patient Name: Deborah Chaney MRN: [id number] DOB:1965/09/27, 58 y.o., female Today's Date: 10/23/2023  END OF SESSION:  PT End of Session - 10/23/23 0804     Visit Number 3    Number of Visits 12    Date for PT Re-Evaluation 12/18/23    PT Start Time 0802    PT Stop Time 0842    PT Time Calculation (min) 40 min    Activity Tolerance Patient tolerated treatment well    Behavior During Therapy WFL for tasks assessed/performed               Past Medical History:  Diagnosis Date   Back pain    Bilateral swelling of feet    DDD (degenerative disc disease), lumbar    Diverticulitis    Headache    Hypertension    Joint pain    Multiple food allergies    Ovarian cyst    PONV (postoperative nausea and vomiting)    Sickle cell disease (HCC)    Vitamin D deficiency    Past Surgical History:  Procedure Laterality Date   ABDOMINAL HYSTERECTOMY  2008   APPENDECTOMY  1982   CESAREAN SECTION  1993   COLON RESECTION SIGMOID N/A 08/08/2020   Procedure: OPEN SIGMOID COLECTOMY;  Surgeon: Junie Olds, MD;  Location: [facility];  Service: General;  Laterality: N/A;   FOOT SURGERY  2008   LAPAROSCOPIC OVARIAN CYSTECTOMY  2004   TUBAL LIGATION  1995   Patient Active Problem List   Diagnosis Date Noted   Diverticulitis of intestine with abscess 08/08/2020   Diverticulitis of colon with perforation 06/20/2020   Sepsis (HCC) 06/20/2020   Obesity, Class III, BMI 40-49.9 (morbid obesity) (HCC) 06/20/2020   Breast lump 02/21/2017   Cyst of ovary 02/21/2017   Lower abdominal pain 02/21/2017   Obesity 02/21/2017   Upper respiratory infection 02/21/2017    PCP: Sun, Vyvyan, MD   REFERRING PROVIDER: Diedra Fowler, MD   REFERRING DIAG: M54.16 (ICD-10-CM) - Radiculopathy, lumbar region  Rationale for Evaluation and Treatment: Rehabilitation  THERAPY DIAG:  Other low back pain  Pain in left hip  Muscle weakness  (generalized)  ONSET DATE: back pain since Novemember 2025  SUBJECTIVE:                                                                                                                                                                                           SUBJECTIVE STATEMENT: Had injection a week ago; each day seems to be getting better.  Feels like sitting and walking are improved.  Still having  burning and numbness/tingling in her foot   PERTINENT HISTORY:  left-sided disc herniation at L5/S1 causing radiculopathy   PAIN:  NPRS scale: 3/10 today Pain location:left hip/buttock/lumbar Pain description: constant, can be sharp or dull, pulling sensation, N/T, burning Aggravating factors: first thing in the morning is worse, toileting, laying on left side, putting on socks.  Relieving factors: propping up, bending over on head, heat did not help and maybe even worse, vicks vapor rub   PRECAUTIONS: None  RED FLAGS: None   WEIGHT BEARING RESTRICTIONS: No  FALLS:  Has patient fallen in last 6 months? Yes. Number of falls 1, due to pain, she does not have fear of falling, relays her balance is good  OCCUPATION: court Programme researcher, broadcasting/film/video  PLOF: Independent  PATIENT GOALS: reduce pain  NEXT MD VISIT: 10/30/23  OBJECTIVE:  Note: Objective measures were completed at Evaluation unless otherwise noted.  DIAGNOSTIC FINDINGS:  "MRI of the lumbar spine from 07/12/2023 was previously independently reviewed and interpreted, showing left paracentral disc herniation with caudal migration causing lateral recess stenosis at that level. No other significant stenosis seen."  PATIENT SURVEYS:  Patient-Specific Activity Scoring Scheme  "0" represents "unable to perform." "10" represents "able to perform at prior level. 0 1 2 3 4 5 6 7 8 9  10 (Date and Score)   Activity Eval     1. walking  4    2. siiting  4    3. Driving  4   4.exercising 2   Score 3.5    Total score = sum of the  activity scores/number of activities Minimum detectable change (90%CI) for average score = 2 points Minimum detectable change (90%CI) for single activity score = 3 points     SENSATION: WFL  LUMBAR ROM:   AROM eval  Flexion 25%  Extension 75%  Right lateral flexion 50%  Left lateral flexion 25%  Right rotation 50%  Left rotation 50%   (Blank rows = not tested)   LOWER EXTREMITY MMT:    MMT Right eval Left eval  Hip flexion  4  Hip extension  4  Hip abduction  4  Hip adduction    Hip internal rotation    Hip external rotation    Knee flexion  4  Knee extension  4  Ankle dorsiflexion    Ankle plantarflexion    Ankle inversion    Ankle eversion     (Blank rows = not tested)  LUMBAR SPECIAL TESTS:  Eval: Direction preference: pain with repeated flexion, no change with repeated extension 10 reps each  FUNCTIONAL TESTS:  Eval: Sit to stand: must use UE support and +gowers sign   TODAY'S TREATMENT:  10/23/23 TherEx NuStep L7 x 8 min Incline board stretch 3x30 sec  TherAct Standing hip abduction 2x10; L3 band Standing hip extension 2x10; L3 band Squats 2x10 - pain reduced range Leg press 100# 2x10   10/15/23 TherEx NuStep L7 x 8 min Standing lumbar extension x 10 reps Standing Lt QL stretch 5x10 sec hold Supine bridges x10 reps; 3-5 sec hold Supine piriformis stretch 3x20 sec hold  Modalities Mechanical lumbar traction 85-75# intermittent X 12 minutes   Eval HEP creation and review with demonstration and trial set preformed, see below for details Mechanical lumbar traction 85-75# intermittent X 15 minutes    PATIENT EDUCATION: Education details: HEP, PT plan of care Person educated: Patient Education method: Explanation, Demonstration, Verbal cues, and Handouts Education comprehension: verbalized understanding and needs further education  HOME EXERCISE PROGRAM: Access Code: [email or web address] URL: [email or web address] Date:  10/09/2023 Prepared by: Jamee Mazzoni  Exercises - Standing Lumbar Extension at Wall - Forearms  - 2 x daily - 6 x weekly - 1-2 sets - 10 reps - 3 sec hold - Standing Quadratus Lumborum Stretch with Doorway  - 2 x daily - 6 x weekly - 1 sets - 5 reps - 10 hold - Supine Bridge  - 2 x daily - 6 x weekly - 1 sets - 10 reps - 2-3 sec hold - Supine Piriformis Stretch with Leg Straight (Mirrored)  - 2 x daily - 6 x weekly - 1 sets - 3 reps - 20 sec hold - Hooklying Lumbar Traction  - 2 x daily - 6 x weekly - 1 sets - 10 reps - 10 sec hold  ASSESSMENT:  CLINICAL IMPRESSION:  Deferred traction today as pt is reporting improvement in symptoms following injection so progressed exercises today.  Will continue to benefit from PT to maximize function.  OBJECTIVE IMPAIRMENTS: decreased activity tolerance, difficulty walking, decreased mobility, decreased ROM, decreased strength, impaired flexibility, impaired LE use, and pain.  ACTIVITY LIMITATIONS: bending, lifting, carry, locomotion, cleaning, community activity, driving, and or occupation  PERSONAL FACTORS: see above PMH, are also affecting patient's functional outcome.  REHAB POTENTIAL: Good  CLINICAL DECISION MAKING: Stable/uncomplicated  EVALUATION COMPLEXITY: Low    GOALS: Short term PT Goals Target date: 11/06/2023   Pt will be I and compliant with HEP. Baseline:  Goal status: New Pt will decrease pain by 25% overall Baseline: Goal status: New  Long term PT goals Target date:12/18/2023   Pt will improve lumbar AROM to [facility] to improve functional mobility Baseline: Goal status: New Pt will improve  left hip/knee strength to at least 4+/5 MMT to improve functional strength Baseline: Goal status: New Pt will improve PSFS to at least 25/40 functional to show improved function Baseline: Goal status: New Pt will reduce pain to overall less than 3/10 with usual activity and work activity. Baseline: Goal status: New Pt will be  able to ambulate community distances at least 1000 ft WNL gait pattern without complaints Baseline: Goal status: New  PLAN: PT FREQUENCY: 1-3 times per week   PT DURATION: 6-10 weeks  PLANNED INTERVENTIONS (unless contraindicated): aquatic PT, Canalith repositioning, cryotherapy, Electrical stimulation, Iontophoresis with 4 mg/ml dexamethasome, Moist heat, traction, Ultrasound, gait training, Therapeutic exercise, balance training, neuromuscular re-education, patient/family education, manual techniques, passive ROM, dry needling, taping, vasopnuematic device, vestibular, spinal manipulations, joint manipulations 97110-Therapeutic exercises, 97530- Therapeutic activity, [id number]- Neuromuscular re-education, 97535- Self Care, [id number]- Manual therapy, [id number]- Electrical stimulation (unattended), and 97012- Traction (mechanical)  PLAN FOR NEXT SESSION: hip/core strengthening, repeat traction PRN, Review and update HEP PRN.    Marley Simmers, PT, DPT 10/23/23 8:43 AM

## 2023-10-25 ENCOUNTER — Ambulatory Visit (INDEPENDENT_AMBULATORY_CARE_PROVIDER_SITE_OTHER): Admitting: Physical Therapy

## 2023-10-25 ENCOUNTER — Encounter: Payer: Self-pay | Admitting: Physical Therapy

## 2023-10-25 DIAGNOSIS — M25552 Pain in left hip: Secondary | ICD-10-CM

## 2023-10-25 DIAGNOSIS — M6281 Muscle weakness (generalized): Secondary | ICD-10-CM

## 2023-10-25 DIAGNOSIS — M5459 Other low back pain: Secondary | ICD-10-CM

## 2023-10-25 NOTE — Therapy (Signed)
 OUTPATIENT PHYSICAL THERAPY TREATMENT   Patient Name: Deborah Chaney MRN: [id number] DOB:1965-07-22, 58 y.o., female Today's Date: 10/25/2023  END OF SESSION:  PT End of Session - 10/25/23 0814     Visit Number 4    Number of Visits 12    Date for PT Re-Evaluation 12/18/23    PT Start Time 0803    PT Stop Time 0842    PT Time Calculation (min) 39 min    Activity Tolerance Patient tolerated treatment well    Behavior During Therapy [facility] for tasks assessed/performed                Past Medical History:  Diagnosis Date   Back pain    Bilateral swelling of feet    DDD (degenerative disc disease), lumbar    Diverticulitis    Headache    Hypertension    Joint pain    Multiple food allergies    Ovarian cyst    PONV (postoperative nausea and vomiting)    Sickle cell disease (HCC)    Vitamin D deficiency    Past Surgical History:  Procedure Laterality Date   ABDOMINAL HYSTERECTOMY  2008   APPENDECTOMY  1982   CESAREAN SECTION  1993   COLON RESECTION SIGMOID N/A 08/08/2020   Procedure: OPEN SIGMOID COLECTOMY;  Surgeon: Junie Olds, MD;  Location: [facility];  Service: General;  Laterality: N/A;   FOOT SURGERY  2008   LAPAROSCOPIC OVARIAN CYSTECTOMY  2004   TUBAL LIGATION  1995   Patient Active Problem List   Diagnosis Date Noted   Diverticulitis of intestine with abscess 08/08/2020   Diverticulitis of colon with perforation 06/20/2020   Sepsis (HCC) 06/20/2020   Obesity, Class III, BMI 40-49.9 (morbid obesity) 06/20/2020   Breast lump 02/21/2017   Cyst of ovary 02/21/2017   Lower abdominal pain 02/21/2017   Obesity 02/21/2017   Upper respiratory infection 02/21/2017    PCP: Sun, Vyvyan, MD   REFERRING PROVIDER: Diedra Fowler, MD   REFERRING DIAG: M54.16 (ICD-10-CM) - Radiculopathy, lumbar region  Rationale for Evaluation and Treatment: Rehabilitation  THERAPY DIAG:  Other low back pain  Pain in left hip  Muscle weakness  (generalized)  ONSET DATE: back pain since Novemember 2025  SUBJECTIVE:                                                                                                                                                                                           SUBJECTIVE STATEMENT:  Legs are sore but back is iffy, feeling more pressure like its pressing on the nerves again. Nothing really new other wise.  PERTINENT HISTORY:  left-sided disc herniation at L5/S1 causing radiculopathy   PAIN:  NPRS scale: 4/10 Pain location:left hip/buttock/lumbar Pain description: sharp, pressure  Aggravating factors: first thing in the morning is worse, toileting, laying on left side, putting on socks.  Relieving factors: propping up, bending over on head, heat did not help and maybe even worse, vicks vapor rub   PRECAUTIONS: None  RED FLAGS: None   WEIGHT BEARING RESTRICTIONS: No  FALLS:  Has patient fallen in last 6 months? Yes. Number of falls 1, due to pain, she does not have fear of falling, relays her balance is good  OCCUPATION: court Programme researcher, broadcasting/film/video  PLOF: Independent  PATIENT GOALS: reduce pain  NEXT MD VISIT: 10/30/23  OBJECTIVE:  Note: Objective measures were completed at Evaluation unless otherwise noted.  DIAGNOSTIC FINDINGS:  "MRI of the lumbar spine from 07/12/2023 was previously independently reviewed and interpreted, showing left paracentral disc herniation with caudal migration causing lateral recess stenosis at that level. No other significant stenosis seen."  PATIENT SURVEYS:  Patient-Specific Activity Scoring Scheme  "0" represents "unable to perform." "10" represents "able to perform at prior level. 0 1 2 3 4 5 6 7 8 9  10 (Date and Score)   Activity Eval     1. walking  4    2. siiting  4    3. Driving  4   4.exercising 2   Score 3.5    Total score = sum of the activity scores/number of activities Minimum detectable change (90%CI) for average score = 2  points Minimum detectable change (90%CI) for single activity score = 3 points     SENSATION: WFL  LUMBAR ROM:   AROM eval  Flexion 25%  Extension 75%  Right lateral flexion 50%  Left lateral flexion 25%  Right rotation 50%  Left rotation 50%   (Blank rows = not tested)   LOWER EXTREMITY MMT:    MMT Right eval Left eval  Hip flexion  4  Hip extension  4  Hip abduction  4  Hip adduction    Hip internal rotation    Hip external rotation    Knee flexion  4  Knee extension  4  Ankle dorsiflexion    Ankle plantarflexion    Ankle inversion    Ankle eversion     (Blank rows = not tested)  LUMBAR SPECIAL TESTS:  Eval: Direction preference: pain with repeated flexion, no change with repeated extension 10 reps each  FUNCTIONAL TESTS:  Eval: Sit to stand: must use UE support and +gowers sign   TODAY'S TREATMENT:    10/25/23  Nustep L7x8 minutes seat 8, all four extremities    Lumbar extensions at the wall x15  Prone hip extensions x10 B 0# Prone opposite UE/LE extensions x10 B Prone press to straight arms x10 Quad stretches 3x30 seconds prone PPT to lumbar arch off table x12 Attempted bridge + green band, unable due to sciatic pain Sciatic flossing x3 rounds (added to HEP)     10/23/23 TherEx NuStep L7 x 8 min Incline board stretch 3x30 sec  TherAct Standing hip abduction 2x10; L3 band Standing hip extension 2x10; L3 band Squats 2x10 - pain reduced range Leg press 100# 2x10   10/15/23 TherEx NuStep L7 x 8 min Standing lumbar extension x 10 reps Standing Lt QL stretch 5x10 sec hold Supine bridges x10 reps; 3-5 sec hold Supine piriformis stretch 3x20 sec hold  Modalities Mechanical lumbar traction 85-75# intermittent X 12 minutes  Eval HEP creation and review with demonstration and trial set preformed, see below for details Mechanical lumbar traction 85-75# intermittent X 15 minutes    PATIENT EDUCATION: Education details: HEP, PT plan  of care Person educated: Patient Education method: Explanation, Demonstration, Verbal cues, and Handouts Education comprehension: verbalized understanding and needs further education   HOME EXERCISE PROGRAM:  Access Code: [email or web address] URL: [email or web address] Date: 10/25/2023 Prepared by: Terrel Ferries  Exercises - Standing Lumbar Extension at Wall - Forearms  - 2 x daily - 6 x weekly - 1-2 sets - 10 reps - 3 sec hold - Standing Quadratus Lumborum Stretch with Doorway  - 2 x daily - 6 x weekly - 1 sets - 5 reps - 10 hold - Supine Bridge  - 2 x daily - 6 x weekly - 1 sets - 10 reps - 2-3 sec hold - Supine Piriformis Stretch with Leg Straight (Mirrored)  - 2 x daily - 6 x weekly - 1 sets - 3 reps - 20 sec hold - Hooklying Lumbar Traction  - 2 x daily - 6 x weekly - 1 sets - 10 reps - 10 sec hold - Straight Leg Raise Sciatic Nerve Flossing  - 1-2 x daily - 7 x weekly - 1 sets - 3 reps - 30-60 seconds  hold    ASSESSMENT:  CLINICAL IMPRESSION:   Pt arrives today doing OK, her back is a little more flared up than last time but pain is not consistent- she reports that mornings are more difficult but her pain gets better with movement through the day. Tried increased focus on extension based exercises as she does report that this helps her pain. We continued to work on appropriate exercise interventions, encouraged morning HEP to help address this.   OBJECTIVE IMPAIRMENTS: decreased activity tolerance, difficulty walking, decreased mobility, decreased ROM, decreased strength, impaired flexibility, impaired LE use, and pain.  ACTIVITY LIMITATIONS: bending, lifting, carry, locomotion, cleaning, community activity, driving, and or occupation  PERSONAL FACTORS: see above PMH, are also affecting patient's functional outcome.  REHAB POTENTIAL: Good  CLINICAL DECISION MAKING: Stable/uncomplicated  EVALUATION COMPLEXITY: Low    GOALS: Short term PT Goals Target date:  11/06/2023   Pt will be I and compliant with HEP. Baseline:  Goal status: New Pt will decrease pain by 25% overall Baseline: Goal status: New  Long term PT goals Target date:12/18/2023   Pt will improve lumbar AROM to [facility] to improve functional mobility Baseline: Goal status: New Pt will improve  left hip/knee strength to at least 4+/5 MMT to improve functional strength Baseline: Goal status: New Pt will improve PSFS to at least 25/40 functional to show improved function Baseline: Goal status: New Pt will reduce pain to overall less than 3/10 with usual activity and work activity. Baseline: Goal status: New Pt will be able to ambulate community distances at least 1000 ft WNL gait pattern without complaints Baseline: Goal status: New  PLAN: PT FREQUENCY: 1-3 times per week   PT DURATION: 6-10 weeks  PLANNED INTERVENTIONS (unless contraindicated): aquatic PT, Canalith repositioning, cryotherapy, Electrical stimulation, Iontophoresis with 4 mg/ml dexamethasome, Moist heat, traction, Ultrasound, gait training, Therapeutic exercise, balance training, neuromuscular re-education, patient/family education, manual techniques, passive ROM, dry needling, taping, vasopnuematic device, vestibular, spinal manipulations, joint manipulations 97110-Therapeutic exercises, 97530- Therapeutic activity, [id number]- Neuromuscular re-education, 97535- Self Care, [id number]- Manual therapy, [id number]- Electrical stimulation (unattended), and 97012- Traction (mechanical)  PLAN FOR NEXT SESSION: hip/core strengthening, repeat traction next time if sx continue to worsen,  Review and update HEP PRN.   Terrel Ferries, PT, DPT 10/25/23 8:43 AM

## 2023-10-29 ENCOUNTER — Ambulatory Visit (INDEPENDENT_AMBULATORY_CARE_PROVIDER_SITE_OTHER): Admitting: Physical Therapy

## 2023-10-29 ENCOUNTER — Encounter: Payer: Self-pay | Admitting: Physical Therapy

## 2023-10-29 DIAGNOSIS — M25552 Pain in left hip: Secondary | ICD-10-CM

## 2023-10-29 DIAGNOSIS — M5459 Other low back pain: Secondary | ICD-10-CM | POA: Diagnosis not present

## 2023-10-29 DIAGNOSIS — M6281 Muscle weakness (generalized): Secondary | ICD-10-CM | POA: Diagnosis not present

## 2023-10-29 NOTE — Therapy (Signed)
 OUTPATIENT PHYSICAL THERAPY TREATMENT   Patient Name: Deborah Chaney MRN: [id number] DOB:Jan 03, 1966, 58 y.o., female Today's Date: 10/29/2023  END OF SESSION:  PT End of Session - 10/29/23 0806     Visit Number 5    Number of Visits 12    Date for PT Re-Evaluation 12/18/23    PT Start Time 0804    PT Stop Time 0854    PT Time Calculation (min) 50 min    Activity Tolerance Patient tolerated treatment well    Behavior During Therapy WFL for tasks assessed/performed                 Past Medical History:  Diagnosis Date   Back pain    Bilateral swelling of feet    DDD (degenerative disc disease), lumbar    Diverticulitis    Headache    Hypertension    Joint pain    Multiple food allergies    Ovarian cyst    PONV (postoperative nausea and vomiting)    Sickle cell disease (HCC)    Vitamin D deficiency    Past Surgical History:  Procedure Laterality Date   ABDOMINAL HYSTERECTOMY  2008   APPENDECTOMY  1982   CESAREAN SECTION  1993   COLON RESECTION SIGMOID N/A 08/08/2020   Procedure: OPEN SIGMOID COLECTOMY;  Surgeon: Junie Olds, MD;  Location: [facility];  Service: General;  Laterality: N/A;   FOOT SURGERY  2008   LAPAROSCOPIC OVARIAN CYSTECTOMY  2004   TUBAL LIGATION  1995   Patient Active Problem List   Diagnosis Date Noted   Diverticulitis of intestine with abscess 08/08/2020   Diverticulitis of colon with perforation 06/20/2020   Sepsis (HCC) 06/20/2020   Obesity, Class III, BMI 40-49.9 (morbid obesity) 06/20/2020   Breast lump 02/21/2017   Cyst of ovary 02/21/2017   Lower abdominal pain 02/21/2017   Obesity 02/21/2017   Upper respiratory infection 02/21/2017    PCP: Sun, Vyvyan, MD   REFERRING PROVIDER: Diedra Fowler, MD   REFERRING DIAG: M54.16 (ICD-10-CM) - Radiculopathy, lumbar region  Rationale for Evaluation and Treatment: Rehabilitation  THERAPY DIAG:  Other low back pain  Pain in left hip  Muscle weakness  (generalized)  ONSET DATE: back pain since Novemember 2025  SUBJECTIVE:                                                                                                                                                                                           SUBJECTIVE STATEMENT: "I have my good days and bad days."   PERTINENT HISTORY:  left-sided disc herniation at L5/S1 causing radiculopathy  PAIN:  NPRS scale: 4/10 Pain location:left hip/buttock/lumbar Pain description: sharp, pressure  Aggravating factors: first thing in the morning is worse, toileting, laying on left side, putting on socks.  Relieving factors: propping up, bending over on head, heat did not help and maybe even worse, vicks vapor rub   PRECAUTIONS: None  RED FLAGS: None   WEIGHT BEARING RESTRICTIONS: No  FALLS:  Has patient fallen in last 6 months? Yes. Number of falls 1, due to pain, she does not have fear of falling, relays her balance is good  OCCUPATION: court Programme researcher, broadcasting/film/video  PLOF: Independent  PATIENT GOALS: reduce pain    OBJECTIVE:  Note: Objective measures were completed at Evaluation unless otherwise noted.  DIAGNOSTIC FINDINGS:  "MRI of the lumbar spine from 07/12/2023 was previously independently reviewed and interpreted, showing left paracentral disc herniation with caudal migration causing lateral recess stenosis at that level. No other significant stenosis seen."  PATIENT SURVEYS:  Patient-Specific Activity Scoring Scheme  "0" represents "unable to perform." "10" represents "able to perform at prior level. 0 1 2 3 4 5 6 7 8 9  10 (Date and Score)   Activity Eval     1. walking  4    2. siiting  4    3. Driving  4   4.exercising 2   Score 3.5    Total score = sum of the activity scores/number of activities Minimum detectable change (90%CI) for average score = 2 points Minimum detectable change (90%CI) for single activity score = 3 points     SENSATION: WFL  LUMBAR  ROM:   AROM eval  Flexion 25%  Extension 75%  Right lateral flexion 50%  Left lateral flexion 25%  Right rotation 50%  Left rotation 50%   (Blank rows = not tested)   LOWER EXTREMITY MMT:    MMT Right eval Left eval  Hip flexion  4  Hip extension  4  Hip abduction  4  Hip adduction    Hip internal rotation    Hip external rotation    Knee flexion  4  Knee extension  4  Ankle dorsiflexion    Ankle plantarflexion    Ankle inversion    Ankle eversion     (Blank rows = not tested)  LUMBAR SPECIAL TESTS:  Eval: Direction preference: pain with repeated flexion, no change with repeated extension 10 reps each  FUNCTIONAL TESTS:  Eval: Sit to stand: must use UE support and +gowers sign   TODAY'S TREATMENT 10/29/23 TherEx NuStep L7 x 8 min; UEs/LEs Lt lateral shift 10 x 5 sec hold Standing lumbar extension 10 x 10 sec hold Prone press to straight arms x10 Prone hip extension alt x10 reps bil; 3 sec hold  Modalities Mechanical lumbar traction 85-75# intermittent X 12 minutes  10/25/23 Nustep L7x8 minutes seat 8, all four extremities    Lumbar extensions at the wall x15  Prone hip extensions x10 B 0# Prone opposite UE/LE extensions x10 B Prone press to straight arms x10 Quad stretches 3x30 seconds prone PPT to lumbar arch off table x12 Attempted bridge + green band, unable due to sciatic pain Sciatic flossing x3 rounds (added to HEP)     10/23/23 TherEx NuStep L7 x 8 min Incline board stretch 3x30 sec  TherAct Standing hip abduction 2x10; L3 band Standing hip extension 2x10; L3 band Squats 2x10 - pain reduced range Leg press 100# 2x10   10/15/23 TherEx NuStep L7 x 8 min Standing lumbar extension x 10  reps Standing Lt QL stretch 5x10 sec hold Supine bridges x10 reps; 3-5 sec hold Supine piriformis stretch 3x20 sec hold  Modalities Mechanical lumbar traction 85-75# intermittent X 12 minutes   Eval HEP creation and review with demonstration and  trial set preformed, see below for details Mechanical lumbar traction 85-75# intermittent X 15 minutes    PATIENT EDUCATION: Education details: HEP, PT plan of care Person educated: Patient Education method: Explanation, Demonstration, Verbal cues, and Handouts Education comprehension: verbalized understanding and needs further education   HOME EXERCISE PROGRAM:  Access Code: [email or web address] URL: [email or web address] Date: 10/25/2023 Prepared by: Terrel Ferries  Exercises - Standing Lumbar Extension at Wall - Forearms  - 2 x daily - 6 x weekly - 1-2 sets - 10 reps - 3 sec hold - Standing Quadratus Lumborum Stretch with Doorway  - 2 x daily - 6 x weekly - 1 sets - 5 reps - 10 hold - Supine Bridge  - 2 x daily - 6 x weekly - 1 sets - 10 reps - 2-3 sec hold - Supine Piriformis Stretch with Leg Straight (Mirrored)  - 2 x daily - 6 x weekly - 1 sets - 3 reps - 20 sec hold - Hooklying Lumbar Traction  - 2 x daily - 6 x weekly - 1 sets - 10 reps - 10 sec hold - Straight Leg Raise Sciatic Nerve Flossing  - 1-2 x daily - 7 x weekly - 1 sets - 3 reps - 30-60 seconds  hold    ASSESSMENT:  CLINICAL IMPRESSION:  Pt continues to have some LLE symptoms at this time, and plan to continue with extension based program as this at least provides short term relief.  Continued traction today as this has also been beneficial.  Continue skilled PT to maximize function.   OBJECTIVE IMPAIRMENTS: decreased activity tolerance, difficulty walking, decreased mobility, decreased ROM, decreased strength, impaired flexibility, impaired LE use, and pain.  ACTIVITY LIMITATIONS: bending, lifting, carry, locomotion, cleaning, community activity, driving, and or occupation  PERSONAL FACTORS: see above PMH, are also affecting patient's functional outcome.  REHAB POTENTIAL: Good  CLINICAL DECISION MAKING: Stable/uncomplicated  EVALUATION COMPLEXITY: Low    GOALS: Short term PT Goals Target date:  11/06/2023   Pt will be I and compliant with HEP. Baseline:  Goal status: New Pt will decrease pain by 25% overall Baseline: Goal status: New  Long term PT goals Target date:12/18/2023   Pt will improve lumbar AROM to [facility] to improve functional mobility Baseline: Goal status: New Pt will improve  left hip/knee strength to at least 4+/5 MMT to improve functional strength Baseline: Goal status: New Pt will improve PSFS to at least 25/40 functional to show improved function Baseline: Goal status: New Pt will reduce pain to overall less than 3/10 with usual activity and work activity. Baseline: Goal status: New Pt will be able to ambulate community distances at least 1000 ft WNL gait pattern without complaints Baseline: Goal status: New  PLAN: PT FREQUENCY: 1-3 times per week   PT DURATION: 6-10 weeks  PLANNED INTERVENTIONS (unless contraindicated): aquatic PT, Canalith repositioning, cryotherapy, Electrical stimulation, Iontophoresis with 4 mg/ml dexamethasome, Moist heat, traction, Ultrasound, gait training, Therapeutic exercise, balance training, neuromuscular re-education, patient/family education, manual techniques, passive ROM, dry needling, taping, vasopnuematic device, vestibular, spinal manipulations, joint manipulations 97110-Therapeutic exercises, 97530- Therapeutic activity, [id number]- Neuromuscular re-education, 97535- Self Care, 54098- Manual therapy, [id number]- Electrical stimulation (unattended), and 97012- Traction (mechanical)  PLAN FOR NEXT SESSION: check STGs, hip/core strengthening, repeat  traction next time if sx continue to worsen, Review and update HEP PRN.  NEXT MD VISIT: 11/11/23  Marley Simmers, PT, DPT 10/29/23 9:45 AM

## 2023-10-30 ENCOUNTER — Ambulatory Visit: Admitting: Orthopedic Surgery

## 2023-10-31 ENCOUNTER — Encounter: Payer: Self-pay | Admitting: Physical Therapy

## 2023-10-31 ENCOUNTER — Ambulatory Visit: Admitting: Physical Therapy

## 2023-10-31 DIAGNOSIS — M5459 Other low back pain: Secondary | ICD-10-CM

## 2023-10-31 DIAGNOSIS — M25552 Pain in left hip: Secondary | ICD-10-CM | POA: Diagnosis not present

## 2023-10-31 DIAGNOSIS — M6281 Muscle weakness (generalized): Secondary | ICD-10-CM | POA: Diagnosis not present

## 2023-10-31 NOTE — Therapy (Signed)
 OUTPATIENT PHYSICAL THERAPY TREATMENT   Patient Name: Deborah Chaney MRN: [id number] DOB:[birth year], 58 y.o., female Today's Date: 10/31/2023  END OF SESSION:  PT End of Session - 10/31/23 0803     Visit Number 6    Number of Visits 12    Date for PT Re-Evaluation 12/18/23    PT Start Time 0800    PT Stop Time 0840    PT Time Calculation (min) 40 min    Activity Tolerance Patient tolerated treatment well    Behavior During Therapy WFL for tasks assessed/performed                  Past Medical History:  Diagnosis Date   Back pain    Bilateral swelling of feet    DDD (degenerative disc disease), lumbar    Diverticulitis    Headache    Hypertension    Joint pain    Multiple food allergies    Ovarian cyst    PONV (postoperative nausea and vomiting)    Sickle cell disease (HCC)    Vitamin D deficiency    Past Surgical History:  Procedure Laterality Date   ABDOMINAL HYSTERECTOMY  2008   APPENDECTOMY  1982   CESAREAN SECTION  1993   COLON RESECTION SIGMOID N/A 08/08/2020   Procedure: OPEN SIGMOID COLECTOMY;  Surgeon: Junie Olds, MD;  Location: [facility];  Service: General;  Laterality: N/A;   FOOT SURGERY  2008   LAPAROSCOPIC OVARIAN CYSTECTOMY  2004   TUBAL LIGATION  1995   Patient Active Problem List   Diagnosis Date Noted   Diverticulitis of intestine with abscess 08/08/2020   Diverticulitis of colon with perforation 06/20/2020   Sepsis (HCC) 06/20/2020   Obesity, Class III, BMI 40-49.9 (morbid obesity) 06/20/2020   Breast lump 02/21/2017   Cyst of ovary 02/21/2017   Lower abdominal pain 02/21/2017   Obesity 02/21/2017   Upper respiratory infection 02/21/2017    PCP: Sun, Vyvyan, MD   REFERRING PROVIDER: Diedra Fowler, MD   REFERRING DIAG: M54.16 (ICD-10-CM) - Radiculopathy, lumbar region  Rationale for Evaluation and Treatment: Rehabilitation  THERAPY DIAG:  Other low back pain  Pain in left hip  Muscle weakness  (generalized)  ONSET DATE: back pain since Novemember 2025  SUBJECTIVE:                                                                                                                                                                                           SUBJECTIVE STATEMENT: "May need to lay off the traction today."  Reports there was a "pulling" sensation last time.   PERTINENT HISTORY:  left-sided disc herniation at L5/S1 causing radiculopathy   PAIN:  NPRS scale: 6/10 Pain location:left hip/buttock/lumbar Pain description: sharp, pressure  Aggravating factors: first thing in the morning is worse, toileting, laying on left side, putting on socks.  Relieving factors: propping up, bending over on head, heat did not help and maybe even worse, vicks vapor rub   PRECAUTIONS: None  RED FLAGS: None   WEIGHT BEARING RESTRICTIONS: No  FALLS:  Has patient fallen in last 6 months? Yes. Number of falls 1, due to pain, she does not have fear of falling, relays her balance is good  OCCUPATION: court Programme researcher, broadcasting/film/video  PLOF: Independent  PATIENT GOALS: reduce pain    OBJECTIVE:  Note: Objective measures were completed at Evaluation unless otherwise noted.  DIAGNOSTIC FINDINGS:  "MRI of the lumbar spine from 07/12/2023 was previously independently reviewed and interpreted, showing left paracentral disc herniation with caudal migration causing lateral recess stenosis at that level. No other significant stenosis seen."  PATIENT SURVEYS:  Patient-Specific Activity Scoring Scheme  "0" represents "unable to perform." "10" represents "able to perform at prior level. 0 1 2 3 4 5 6 7 8 9  10 (Date and Score)   Activity Eval     1. walking  4    2. siiting  4    3. Driving  4   4.exercising 2   Score 3.5    Total score = sum of the activity scores/number of activities Minimum detectable change (90%CI) for average score = 2 points Minimum detectable change (90%CI) for single  activity score = 3 points     SENSATION: WFL  LUMBAR ROM:   AROM eval  Flexion 25%  Extension 75%  Right lateral flexion 50%  Left lateral flexion 25%  Right rotation 50%  Left rotation 50%   (Blank rows = not tested)   LOWER EXTREMITY MMT:    MMT Right eval Left eval  Hip flexion  4  Hip extension  4  Hip abduction  4  Hip adduction    Hip internal rotation    Hip external rotation    Knee flexion  4  Knee extension  4  Ankle dorsiflexion    Ankle plantarflexion    Ankle inversion    Ankle eversion     (Blank rows = not tested)  LUMBAR SPECIAL TESTS:  Eval: Direction preference: pain with repeated flexion, no change with repeated extension 10 reps each  FUNCTIONAL TESTS:  Eval: Sit to stand: must use UE support and +gowers sign   TODAY'S TREATMENT 10/31/23 TherEx NuStep L7 x 8 min; UEs/LEs Lt lateral shift 10 x 5 sec hold Standing lumbar extension 10 x 10 sec hold Supine pelvic tilt with partial bridge x10 reps; 5 sec hold Lower trunk rotation 3x15 sec hold Single knee to chest 3x20 sec bil Isometric hip flexion opp arm/leg x10 reps bil  TherAct Leg Press 75# 3x10  10/29/23 TherEx NuStep L7 x 8 min; UEs/LEs Lt lateral shift 10 x 5 sec hold Standing lumbar extension 10 x 10 sec hold Prone press to straight arms x10 Prone hip extension alt x10 reps bil; 3 sec hold  Modalities Mechanical lumbar traction 85-75# intermittent X 12 minutes  10/25/23 Nustep L7x8 minutes seat 8, all four extremities    Lumbar extensions at the wall x15  Prone hip extensions x10 B 0# Prone opposite UE/LE extensions x10 B Prone press to straight arms x10 Quad stretches 3x30 seconds prone PPT to lumbar arch off table  x12 Attempted bridge + green band, unable due to sciatic pain Sciatic flossing x3 rounds (added to HEP)     10/23/23 TherEx NuStep L7 x 8 min Incline board stretch 3x30 sec  TherAct Standing hip abduction 2x10; L3 band Standing hip extension  2x10; L3 band Squats 2x10 - pain reduced range Leg press 100# 2x10   10/15/23 TherEx NuStep L7 x 8 min Standing lumbar extension x 10 reps Standing Lt QL stretch 5x10 sec hold Supine bridges x10 reps; 3-5 sec hold Supine piriformis stretch 3x20 sec hold  Modalities Mechanical lumbar traction 85-75# intermittent X 12 minutes   Eval HEP creation and review with demonstration and trial set preformed, see below for details Mechanical lumbar traction 85-75# intermittent X 15 minutes    PATIENT EDUCATION: Education details: HEP, PT plan of care Person educated: Patient Education method: Explanation, Demonstration, Verbal cues, and Handouts Education comprehension: verbalized understanding and needs further education   HOME EXERCISE PROGRAM:  Access Code: [email or web address] URL: [email or web address] Date: 10/25/2023 Prepared by: Terrel Ferries  Exercises - Standing Lumbar Extension at Wall - Forearms  - 2 x daily - 6 x weekly - 1-2 sets - 10 reps - 3 sec hold - Standing Quadratus Lumborum Stretch with Doorway  - 2 x daily - 6 x weekly - 1 sets - 5 reps - 10 hold - Supine Bridge  - 2 x daily - 6 x weekly - 1 sets - 10 reps - 2-3 sec hold - Supine Piriformis Stretch with Leg Straight (Mirrored)  - 2 x daily - 6 x weekly - 1 sets - 3 reps - 20 sec hold - Hooklying Lumbar Traction  - 2 x daily - 6 x weekly - 1 sets - 10 reps - 10 sec hold - Straight Leg Raise Sciatic Nerve Flossing  - 1-2 x daily - 7 x weekly - 1 sets - 3 reps - 30-60 seconds  hold    ASSESSMENT:  CLINICAL IMPRESSION:  Overall pt reporting 70% improvement and has met both STGs.  [place] continue to benefit from PT to maximize function.  OBJECTIVE IMPAIRMENTS: decreased activity tolerance, difficulty walking, decreased mobility, decreased ROM, decreased strength, impaired flexibility, impaired LE use, and pain.  ACTIVITY LIMITATIONS: bending, lifting, carry, locomotion, cleaning, community activity,  driving, and or occupation  PERSONAL FACTORS: see above PMH, are also affecting patient's functional outcome.  REHAB POTENTIAL: Good  CLINICAL DECISION MAKING: Stable/uncomplicated  EVALUATION COMPLEXITY: Low    GOALS: Short term PT Goals Target date: 11/06/2023   Pt will be I and compliant with HEP. Baseline:  Goal status: MET 10/31/23 Pt will decrease pain by 25% overall Baseline: Goal status: MET 10/31/23  Long term PT goals Target date:12/18/2023   Pt will improve lumbar AROM to [facility] to improve functional mobility Baseline: Goal status: New Pt will improve  left hip/knee strength to at least 4+/5 MMT to improve functional strength Baseline: Goal status: New Pt will improve PSFS to at least 25/40 functional to show improved function Baseline: Goal status: New Pt will reduce pain to overall less than 3/10 with usual activity and work activity. Baseline: Goal status: New Pt will be able to ambulate community distances at least 1000 ft WNL gait pattern without complaints Baseline: Goal status: New  PLAN: PT FREQUENCY: 1-3 times per week   PT DURATION: 6-10 weeks  PLANNED INTERVENTIONS (unless contraindicated): aquatic PT, Canalith repositioning, cryotherapy, Electrical stimulation, Iontophoresis with 4 mg/ml dexamethasome, Moist heat, traction, Ultrasound, gait training, Therapeutic exercise, balance training,  neuromuscular re-education, patient/family education, manual techniques, passive ROM, dry needling, taping, vasopnuematic device, vestibular, spinal manipulations, joint manipulations 97110-Therapeutic exercises, 97530- Therapeutic activity, [id number]- Neuromuscular re-education, 97535- Self Care, [id number]- Manual therapy, [id number]- Electrical stimulation (unattended), and 97012- Traction (mechanical)  PLAN FOR NEXT SESSION: hip/core strengthening, repeat traction next time if sx continue to worsen, Review and update HEP PRN.  NEXT MD VISIT: 11/11/23  Marley Simmers,  PT, DPT 10/31/23 8:43 AM

## 2023-11-06 ENCOUNTER — Encounter: Admitting: Physical Therapy

## 2023-11-08 ENCOUNTER — Encounter: Payer: Self-pay | Admitting: Physical Therapy

## 2023-11-08 ENCOUNTER — Ambulatory Visit: Admitting: Physical Therapy

## 2023-11-08 DIAGNOSIS — M25552 Pain in left hip: Secondary | ICD-10-CM

## 2023-11-08 DIAGNOSIS — M6281 Muscle weakness (generalized): Secondary | ICD-10-CM

## 2023-11-08 DIAGNOSIS — M5459 Other low back pain: Secondary | ICD-10-CM | POA: Diagnosis not present

## 2023-11-08 NOTE — Therapy (Signed)
 OUTPATIENT PHYSICAL THERAPY TREATMENT   Patient Name: Deborah Chaney MRN: [id number] DOB:1966-06-22, 58 y.o., female Today's Date: 11/08/2023  END OF SESSION:  PT End of Session - 11/08/23 0812     Visit Number 7    Number of Visits 12    Date for PT Re-Evaluation 12/18/23    PT Start Time 0802    PT Stop Time 0840    PT Time Calculation (min) 38 min    Activity Tolerance Patient tolerated treatment well    Behavior During Therapy WFL for tasks assessed/performed                   Past Medical History:  Diagnosis Date   Back pain    Bilateral swelling of feet    DDD (degenerative disc disease), lumbar    Diverticulitis    Headache    Hypertension    Joint pain    Multiple food allergies    Ovarian cyst    PONV (postoperative nausea and vomiting)    Sickle cell disease (HCC)    Vitamin D deficiency    Past Surgical History:  Procedure Laterality Date   ABDOMINAL HYSTERECTOMY  2008   APPENDECTOMY  1982   CESAREAN SECTION  1993   COLON RESECTION SIGMOID N/A 08/08/2020   Procedure: OPEN SIGMOID COLECTOMY;  Surgeon: Junie Olds, MD;  Location: [facility];  Service: General;  Laterality: N/A;   FOOT SURGERY  2008   LAPAROSCOPIC OVARIAN CYSTECTOMY  2004   TUBAL LIGATION  1995   Patient Active Problem List   Diagnosis Date Noted   Diverticulitis of intestine with abscess 08/08/2020   Diverticulitis of colon with perforation 06/20/2020   Sepsis (HCC) 06/20/2020   Obesity, Class III, BMI 40-49.9 (morbid obesity) 06/20/2020   Breast lump 02/21/2017   Cyst of ovary 02/21/2017   Lower abdominal pain 02/21/2017   Obesity 02/21/2017   Upper respiratory infection 02/21/2017    PCP: Sun, Vyvyan, MD   REFERRING PROVIDER: Diedra Fowler, MD   REFERRING DIAG: M54.16 (ICD-10-CM) - Radiculopathy, lumbar region  Rationale for Evaluation and Treatment: Rehabilitation  THERAPY DIAG:  Other low back pain  Muscle weakness (generalized)  Pain in  left hip  ONSET DATE: back pain since Novemember 2025  SUBJECTIVE:                                                                                                                                                                                           SUBJECTIVE STATEMENT:  I'm getting tired of this pain, its been angry recently. We did the leg press, during was OK but  had a lot of nerve pain for several days after, almost like a set back. Soft mattress is better for exercises but hard chair is better for sitting pain wise.    PERTINENT HISTORY:  left-sided disc herniation at L5/S1 causing radiculopathy   PAIN:  NPRS scale: 5/10 Pain location: more in the left hip and down back of thigh  Pain description: sharp,  tingling  Aggravating factors: first thing in the morning is worse, toileting, laying on left side, putting on socks. Leg press  Relieving factors: propping up, bending over on head, heat did not help and maybe even worse, vicks vapor rub, shifting off of it    PRECAUTIONS: None  RED FLAGS: None   WEIGHT BEARING RESTRICTIONS: No  FALLS:  Has patient fallen in last 6 months? Yes. Number of falls 1, due to pain, she does not have fear of falling, relays her balance is good  OCCUPATION: court Programme researcher, broadcasting/film/video  PLOF: Independent  PATIENT GOALS: reduce pain    OBJECTIVE:  Note: Objective measures were completed at Evaluation unless otherwise noted.  DIAGNOSTIC FINDINGS:  "MRI of the lumbar spine from 07/12/2023 was previously independently reviewed and interpreted, showing left paracentral disc herniation with caudal migration causing lateral recess stenosis at that level. No other significant stenosis seen."  PATIENT SURVEYS:  Patient-Specific Activity Scoring Scheme  "0" represents "unable to perform." "10" represents "able to perform at prior level. 0 1 2 3 4 5 6 7 8 9  10 (Date and Score)   Activity Eval     1. walking  4    2. siiting  4    3. Driving   4   4.exercising 2   Score 3.5    Total score = sum of the activity scores/number of activities Minimum detectable change (90%CI) for average score = 2 points Minimum detectable change (90%CI) for single activity score = 3 points     SENSATION: WFL  LUMBAR ROM:   AROM eval  Flexion 25%  Extension 75%  Right lateral flexion 50%  Left lateral flexion 25%  Right rotation 50%  Left rotation 50%   (Blank rows = not tested)   LOWER EXTREMITY MMT:    MMT Right eval Left eval  Hip flexion  4  Hip extension  4  Hip abduction  4  Hip adduction    Hip internal rotation    Hip external rotation    Knee flexion  4  Knee extension  4  Ankle dorsiflexion    Ankle plantarflexion    Ankle inversion    Ankle eversion     (Blank rows = not tested)  LUMBAR SPECIAL TESTS:  Eval: Direction preference: pain with repeated flexion, no change with repeated extension 10 reps each  FUNCTIONAL TESTS:  Eval: Sit to stand: must use UE support and +gowers sign   TODAY'S TREATMENT  11/08/23  Nustep L5x8 minutes all four extremities, seat 8  Checked SI- L side appeared anteriorly rotated, tried SI MET- 100% correction with reduced pain noted after  Attempted bridges- unable, sharp pain lumbar spine PPT 15x3 seconds PPT + march x10   Percussion gun with towel for padding L piriformis and hams       10/31/23 TherEx NuStep L7 x 8 min /LEs Lt lateral shift 10 x 5 sec hold Standing lumbar extension 10 x 10 sec hold Supine pelvic tilt with partial bridge x10 reps; 5 sec hold Lower trunk rotation 3x15 sec hold Single knee to chest 3x20  sec bil Isometric hip flexion opp arm/leg x10 reps bil  TherAct Leg Press 75# 3x10  10/29/23 TherEx NuStep L7 x 8 min; UEs/LEs Lt lateral shift 10 x 5 sec hold Standing lumbar extension 10 x 10 sec hold Prone press to straight arms x10 Prone hip extension alt x10 reps bil; 3 sec hold  Modalities Mechanical lumbar traction 85-75# intermittent  X 12 minutes  10/25/23 Nustep L7x8 minutes seat 8, all four extremities    Lumbar extensions at the wall x15  Prone hip extensions x10 B 0# Prone opposite UE/LE extensions x10 B Prone press to straight arms x10 Quad stretches 3x30 seconds prone PPT to lumbar arch off table x12 Attempted bridge + green band, unable due to sciatic pain Sciatic flossing x3 rounds (added to HEP)     10/23/23 TherEx NuStep L7 x 8 min Incline board stretch 3x30 sec  TherAct Standing hip abduction 2x10; L3 band Standing hip extension 2x10; L3 band Squats 2x10 - pain reduced range Leg press 100# 2x10   10/15/23 TherEx NuStep L7 x 8 min Standing lumbar extension x 10 reps Standing Lt QL stretch 5x10 sec hold Supine bridges x10 reps; 3-5 sec hold Supine piriformis stretch 3x20 sec hold  Modalities Mechanical lumbar traction 85-75# intermittent X 12 minutes   Eval HEP creation and review with demonstration and trial set preformed, see below for details Mechanical lumbar traction 85-75# intermittent X 15 minutes    PATIENT EDUCATION: Education details: HEP, PT plan of care Person educated: Patient Education method: Explanation, Demonstration, Verbal cues, and Handouts Education comprehension: verbalized understanding and needs further education   HOME EXERCISE PROGRAM:  Access Code: [email or web address] URL: [email or web address] Date: 10/25/2023 Prepared by: Terrel Ferries  Exercises - Standing Lumbar Extension at Wall - Forearms  - 2 x daily - 6 x weekly - 1-2 sets - 10 reps - 3 sec hold - Standing Quadratus Lumborum Stretch with Doorway  - 2 x daily - 6 x weekly - 1 sets - 5 reps - 10 hold - Supine Bridge  - 2 x daily - 6 x weekly - 1 sets - 10 reps - 2-3 sec hold - Supine Piriformis Stretch with Leg Straight (Mirrored)  - 2 x daily - 6 x weekly - 1 sets - 3 reps - 20 sec hold - Hooklying Lumbar Traction  - 2 x daily - 6 x weekly - 1 sets - 10 reps - 10 sec hold - Straight  Leg Raise Sciatic Nerve Flossing  - 1-2 x daily - 7 x weekly - 1 sets - 3 reps - 30-60 seconds  hold    ASSESSMENT:  CLINICAL IMPRESSION:   Having more pain this week, felt very flared up from the leg press last week. Tried percussion gun to HS and piriformis today as she continues to have sciatic sx, also checked SI and guided pt through SI MET, which resolved L anterior rotation and improved pain. Still getting some significant pain flares, will continue to assess and progress.   OBJECTIVE IMPAIRMENTS: decreased activity tolerance, difficulty walking, decreased mobility, decreased ROM, decreased strength, impaired flexibility, impaired LE use, and pain.  ACTIVITY LIMITATIONS: bending, lifting, carry, locomotion, cleaning, community activity, driving, and or occupation  PERSONAL FACTORS: see above PMH, are also affecting patient's functional outcome.  REHAB POTENTIAL: Good  CLINICAL DECISION MAKING: Stable/uncomplicated  EVALUATION COMPLEXITY: Low    GOALS: Short term PT Goals Target date: 11/06/2023   Pt will be I and compliant with HEP. Baseline:  Goal  status: MET 10/31/23 Pt will decrease pain by 25% overall Baseline: Goal status: MET 10/31/23  Long term PT goals Target date:12/18/2023   Pt will improve lumbar AROM to [facility] to improve functional mobility Baseline: Goal status: New Pt will improve  left hip/knee strength to at least 4+/5 MMT to improve functional strength Baseline: Goal status: New Pt will improve PSFS to at least 25/40 functional to show improved function Baseline: Goal status: New Pt will reduce pain to overall less than 3/10 with usual activity and work activity. Baseline: Goal status: New Pt will be able to ambulate community distances at least 1000 ft WNL gait pattern without complaints Baseline: Goal status: New  PLAN: PT FREQUENCY: 1-3 times per week   PT DURATION: 6-10 weeks  PLANNED INTERVENTIONS (unless contraindicated): aquatic PT,  Canalith repositioning, cryotherapy, Electrical stimulation, Iontophoresis with 4 mg/ml dexamethasome, Moist heat, traction, Ultrasound, gait training, Therapeutic exercise, balance training, neuromuscular re-education, patient/family education, manual techniques, passive ROM, dry needling, taping, vasopnuematic device, vestibular, spinal manipulations, joint manipulations 97110-Therapeutic exercises, 97530- Therapeutic activity, [id number]- Neuromuscular re-education, 97535- Self Care, 62130- Manual therapy, G0283- Electrical stimulation (unattended), and 97012- Traction (mechanical)  PLAN FOR NEXT SESSION: hip/core strengthening, how did she like percussion gun work/how did she feel after SI MET? What did MD say?   NEXT MD VISIT: 11/11/23  Terrel Ferries, PT, DPT 11/08/23 8:40 AM

## 2023-11-11 ENCOUNTER — Ambulatory Visit: Admitting: Orthopedic Surgery

## 2023-11-11 DIAGNOSIS — M5416 Radiculopathy, lumbar region: Secondary | ICD-10-CM

## 2023-11-11 MED ORDER — METHYLPREDNISOLONE 4 MG PO TBPK: ORAL_TABLET | ORAL | 0 refills | Status: DC

## 2023-11-11 NOTE — Progress Notes (Signed)
 Orthopedic Spine Surgery Office Note   Assessment: Patient is a 58 y.o. female with low back pain that radiates into the posterior thigh and leg. Has a left-sided disc herniation at L5/S1 causing radiculopathy     Plan: -Patient has tried tylenol , diclofenac , tizanidine, oral steroids, lyrica , lumbar steroid injection -Patient has gotten about 80% relief with injections, so would consider that again in the future -Prescribed a Medrol  Dosepak for additional pain relief - She should continue to work with PT.  Explained that surgery is not an option at this point given her policy with [employer].  She needs to do at least 6 weeks of surgery per their policy before they would approve a elective spine surgery -We did discuss today in the office her surgical options since she has tried multiple conservative treatments but has persistent symptoms.  Went over L5/S1 microdiscectomy including some of the risks and benefits of that surgery -Patient should return to office on an as-needed basis   Patient expressed understanding of the plan and all questions were answered to the patient's satisfaction.    ___________________________________________________________________________     History:   Patient is a 58 y.o. female who presents today for follow-up on her lumbar spine.  She continues to have low back and left thigh and leg pain on the left side.  She says the injections have been helpful.  They gave her about 70% relief.  She does not have any right-sided symptoms.  She sometimes notices flares in her symptoms and then they get better.  Has some decrease sensation particular in the mornings over the lateral aspect of her distal leg and foot.  She has not developed any new symptoms since she was last seen in the office.     Treatments tried: tylenol , diclofenac , tizanidine, oral steroids, lyrica , lumbar steroid injection     Physical Exam:   General: no acute distress, appears stated age Neurologic:  alert, answering questions appropriately, following commands Respiratory: unlabored breathing on room air, symmetric chest rise Psychiatric: appropriate affect, normal cadence to speech     MSK (spine):   -Strength exam                                                   Left                  Right EHL                              5/5                  5/5 TA                                 5/5                  5/5 GSC                             5/5                  5/5 Knee extension            5/5  5/5 Hip flexion                    5/5                  5/5   -Sensory exam                           Sensation intact to light touch in L3-S1 nerve distributions of bilateral lower extremities     Imaging: XRs of the lumbar spine from 08/13/2023 were previously independently reviewed and interpreted, showing small amount of disc height loss at L5/S1. No other significant degenerative changes seen. No evidence of instability on flexion/extension views. No fracture or dislocation seen.    MRI of the lumbar spine from 07/12/2023 was previously independently reviewed and interpreted, showing left paracentral disc herniation with caudal migration causing lateral recess stenosis at that level. No other significant stenosis seen.      Patient name: Deborah Chaney Patient MRN: [id number] Date of visit: 11/11/23

## 2023-11-12 ENCOUNTER — Ambulatory Visit (INDEPENDENT_AMBULATORY_CARE_PROVIDER_SITE_OTHER): Admitting: Physical Therapy

## 2023-11-12 ENCOUNTER — Encounter: Payer: Self-pay | Admitting: Physical Therapy

## 2023-11-12 ENCOUNTER — Encounter: Payer: Self-pay | Admitting: Orthopedic Surgery

## 2023-11-12 DIAGNOSIS — M6281 Muscle weakness (generalized): Secondary | ICD-10-CM

## 2023-11-12 DIAGNOSIS — M25552 Pain in left hip: Secondary | ICD-10-CM

## 2023-11-12 DIAGNOSIS — M5459 Other low back pain: Secondary | ICD-10-CM

## 2023-11-12 NOTE — Therapy (Addendum)
 OUTPATIENT PHYSICAL THERAPY TREATMENT DISCHARGE SUMMARY   Patient Name: Deborah Chaney MRN: [id number] DOB:Jan 31, 1966, 58 y.o., female Today's Date: 11/12/2023  END OF SESSION:  PT End of Session - 11/12/23 0808     Visit Number 8    Number of Visits 12    Date for PT Re-Evaluation 12/18/23    PT Start Time 0807   pt arrived late   PT Stop Time 0837    PT Time Calculation (min) 30 min    Activity Tolerance Patient tolerated treatment well    Behavior During Therapy WFL for tasks assessed/performed                    Past Medical History:  Diagnosis Date   Back pain    Bilateral swelling of feet    DDD (degenerative disc disease), lumbar    Diverticulitis    Headache    Hypertension    Joint pain    Multiple food allergies    Ovarian cyst    PONV (postoperative nausea and vomiting)    Sickle cell disease (HCC)    Vitamin D deficiency    Past Surgical History:  Procedure Laterality Date   ABDOMINAL HYSTERECTOMY  2008   APPENDECTOMY  1982   CESAREAN SECTION  1993   COLON RESECTION SIGMOID N/A 08/08/2020   Procedure: OPEN SIGMOID COLECTOMY;  Surgeon: Lyndel Deward PARAS, MD;  Location: [facility];  Service: General;  Laterality: N/A;   FOOT SURGERY  2008   LAPAROSCOPIC OVARIAN CYSTECTOMY  2004   TUBAL LIGATION  1995   Patient Active Problem List   Diagnosis Date Noted   Diverticulitis of intestine with abscess 08/08/2020   Diverticulitis of colon with perforation 06/20/2020   Sepsis (HCC) 06/20/2020   Obesity, Class III, BMI 40-49.9 (morbid obesity) 06/20/2020   Breast lump 02/21/2017   Cyst of ovary 02/21/2017   Lower abdominal pain 02/21/2017   Obesity 02/21/2017   Upper respiratory infection 02/21/2017    PCP: Sun, Vyvyan, MD   REFERRING PROVIDER: Georgina Ozell LABOR, MD   REFERRING DIAG: M54.16 (ICD-10-CM) - Radiculopathy, lumbar region  Rationale for Evaluation and Treatment: Rehabilitation  THERAPY DIAG:  Other low back  pain  Muscle weakness (generalized)  Pain in left hip  ONSET DATE: back pain since Novemember 2025  SUBJECTIVE:                                                                                                                                                                                           SUBJECTIVE STATEMENT: Still having continued pain and limited benefit from PT at this time; pain  more elevated today than in prior sessions   PERTINENT HISTORY:  left-sided disc herniation at L5/S1 causing radiculopathy   PAIN:  NPRS scale: 7/10 Pain location: more in the left hip and down back of thigh  Pain description: sharp,  tingling  Aggravating factors: first thing in the morning is worse, toileting, laying on left side, putting on socks. Leg press  Relieving factors: propping up, bending over on head, heat did not help and maybe even worse, vicks vapor rub, shifting off of it    PRECAUTIONS: None  RED FLAGS: None   WEIGHT BEARING RESTRICTIONS: No  FALLS:  Has patient fallen in last 6 months? Yes. Number of falls 1, due to pain, she does not have fear of falling, relays her balance is good  OCCUPATION: court Programme researcher, broadcasting/film/video  PLOF: Independent  PATIENT GOALS: reduce pain    OBJECTIVE:  Note: Objective measures were completed at Evaluation unless otherwise noted.  DIAGNOSTIC FINDINGS:  MRI of the lumbar spine from 07/12/2023 was previously independently reviewed and interpreted, showing left paracentral disc herniation with caudal migration causing lateral recess stenosis at that level. No other significant stenosis seen.  PATIENT SURVEYS:  Patient-Specific Activity Scoring Scheme  0 represents "unable to perform." 10 represents "able to perform at prior level. 0 1 2 3 4 5 6 7 8 9  10 (Date and Score)   Activity Eval  11/12/23   1. walking  4  6  2. siiting  4  4  3. Driving  4 3  4.exercising 2 5  Score 3.5 4.5 (18/40)   Total score = sum of the  activity scores/number of activities Minimum detectable change (90%CI) for average score = 2 points Minimum detectable change (90%CI) for single activity score = 3 points     SENSATION: WFL  LUMBAR ROM:   AROM eval 11/12/23  Flexion 25% unable  Extension 75% WNL  Right lateral flexion 50% WNL  Left lateral flexion 25% Limited 50%  Right rotation 50% WNL  Left rotation 50% Limited 25%   (Blank rows = not tested)   LOWER EXTREMITY MMT:    MMT Left eval Left 11/12/23  Hip flexion 4 4  Hip extension 4 4  Hip abduction 4 4  Knee flexion 4 5  Knee extension 4 5   (Blank rows = not tested)  LUMBAR SPECIAL TESTS:  Eval: Direction preference: pain with repeated flexion, no change with repeated extension 10 reps each  FUNCTIONAL TESTS:  Eval: Sit to stand: must use UE support and +gowers sign   TODAY'S TREATMENT 11/12/23 Goal assessment and discussion of outcomes and limited progress.  Also discussed current plan of care with pt and pt in agreement.  Did recommend continued extension based exercises to help with managing symptoms at this time as pt is still having difficulty with pain.     11/08/23 Nustep L5x8 minutes all four extremities, seat 8  Checked SI- L side appeared anteriorly rotated, tried SI MET- 100% correction with reduced pain noted after  Attempted bridges- unable, sharp pain lumbar spine PPT 15x3 seconds PPT + march x10   Percussion gun with towel for padding L piriformis and hams       10/31/23 TherEx NuStep L7 x 8 min /LEs Lt lateral shift 10 x 5 sec hold Standing lumbar extension 10 x 10 sec hold Supine pelvic tilt with partial bridge x10 reps; 5 sec hold Lower trunk rotation 3x15 sec hold Single knee to chest 3x20 sec bil Isometric  hip flexion opp arm/leg x10 reps bil  TherAct Leg Press 75# 3x10  10/29/23 TherEx NuStep L7 x 8 min; UEs/LEs Lt lateral shift 10 x 5 sec hold Standing lumbar extension 10 x 10 sec hold Prone press to straight  arms x10 Prone hip extension alt x10 reps bil; 3 sec hold  Modalities Mechanical lumbar traction 85-75# intermittent X 12 minutes  10/25/23 Nustep L7x8 minutes seat 8, all four extremities    Lumbar extensions at the wall x15  Prone hip extensions x10 B 0# Prone opposite UE/LE extensions x10 B Prone press to straight arms x10 Quad stretches 3x30 seconds prone PPT to lumbar arch off table x12 Attempted bridge + green band, unable due to sciatic pain Sciatic flossing x3 rounds (added to HEP)    PATIENT EDUCATION: Education details: HEP, PT plan of care Person educated: Patient Education method: Explanation, Demonstration, Verbal cues, and Handouts Education comprehension: verbalized understanding and needs further education   HOME EXERCISE PROGRAM:  Access Code: [email or web address] URL: [email or web address] Date: 10/25/2023 Prepared by: Josette Rough  Exercises - Standing Lumbar Extension at Wall - Forearms  - 2 x daily - 6 x weekly - 1-2 sets - 10 reps - 3 sec hold - Standing Quadratus Lumborum Stretch with Doorway  - 2 x daily - 6 x weekly - 1 sets - 5 reps - 10 hold - Supine Bridge  - 2 x daily - 6 x weekly - 1 sets - 10 reps - 2-3 sec hold - Supine Piriformis Stretch with Leg Straight (Mirrored)  - 2 x daily - 6 x weekly - 1 sets - 3 reps - 20 sec hold - Hooklying Lumbar Traction  - 2 x daily - 6 x weekly - 1 sets - 10 reps - 10 sec hold - Straight Leg Raise Sciatic Nerve Flossing  - 1-2 x daily - 7 x weekly - 1 sets - 3 reps - 30-60 seconds  hold    ASSESSMENT:  CLINICAL IMPRESSION:  At this time pt has completed 6 weeks of PT with limited functional improvements reported and continued reports of high levels of pain.  Recommend holding PT at this time and pt will continue further discussion with MD regarding injections/surgery/etc.     OBJECTIVE IMPAIRMENTS: decreased activity tolerance, difficulty walking, decreased mobility, decreased ROM, decreased  strength, impaired flexibility, impaired LE use, and pain.  ACTIVITY LIMITATIONS: bending, lifting, carry, locomotion, cleaning, community activity, driving, and or occupation  PERSONAL FACTORS: see above PMH, are also affecting patient's functional outcome.  REHAB POTENTIAL: Good  CLINICAL DECISION MAKING: Stable/uncomplicated  EVALUATION COMPLEXITY: Low    GOALS: Short term PT Goals Target date: 11/06/2023   Pt will be I and compliant with HEP. Baseline:  Goal status: MET 10/31/23 Pt will decrease pain by 25% overall Baseline: Goal status: MET 10/31/23  Long term PT goals Target date:12/18/2023   Pt will improve lumbar AROM to [facility] to improve functional mobility Baseline: Goal status: ONGOING 11/12/23 Pt will improve  left hip/knee strength to at least 4+/5 MMT to improve functional strength Baseline: Goal status: ONGOING 11/12/23 Pt will improve PSFS to at least 25/40 functional to show improved function Baseline: Goal status: ONGOING 11/12/23 Pt will reduce pain to overall less than 3/10 with usual activity and work activity. Baseline: Goal status: ONGOING 11/12/23 Pt will be able to ambulate community distances at least 1000 ft WNL gait pattern without complaints Baseline: Goal status: ONGOING 11/12/23  PLAN: PT FREQUENCY: 1-3 times per week  PT DURATION: 6-10 weeks  PLANNED INTERVENTIONS (unless contraindicated): aquatic PT, Canalith repositioning, cryotherapy, Electrical stimulation, Iontophoresis with 4 mg/ml dexamethasome, Moist heat, traction, Ultrasound, gait training, Therapeutic exercise, balance training, neuromuscular re-education, patient/family education, manual techniques, passive ROM, dry needling, taping, vasopnuematic device, vestibular, spinal manipulations, joint manipulations 97110-Therapeutic exercises, 97530- Therapeutic activity, 97112- Neuromuscular re-education, 97535- Self Care, 02859- Manual therapy, [id number]- Electrical stimulation (unattended), and  97012- Traction (mechanical)  PLAN FOR NEXT SESSION: hold PT at this time    NEXT MD VISIT: No f/u scheduled at this time  Corean JULIANNA Ku, PT, DPT 11/12/23 8:46 AM     PHYSICAL THERAPY DISCHARGE SUMMARY  Visits from Start of Care: 8  Current functional level related to goals / functional outcomes: See above   Remaining deficits: See above   Education / Equipment: HEP   Patient agrees to discharge. Patient goals were not met. Patient is being discharged due to did not respond to therapy.    Corean JULIANNA Ku, PT, DPT 02/12/24 12:11 PM  [facility] [address] [place], [place], [address] Phone: [phone]   Fax:  [phone]

## 2023-11-14 ENCOUNTER — Encounter: Admitting: Physical Therapy

## 2023-11-14 ENCOUNTER — Telehealth: Payer: Self-pay | Admitting: Orthopedic Surgery

## 2023-11-14 NOTE — Telephone Encounter (Signed)
 Patient called and said that she sent a email and haven't got a response. She stated that her pain is severe now she can barely walk. [phone]

## 2023-11-15 ENCOUNTER — Emergency Department (HOSPITAL_BASED_OUTPATIENT_CLINIC_OR_DEPARTMENT_OTHER)
Admission: EM | Admit: 2023-11-15 | Discharge: 2023-11-15 | Disposition: A | Discharge status: Home / Self Care | Attending: Emergency Medicine | Admitting: Emergency Medicine

## 2023-11-15 ENCOUNTER — Other Ambulatory Visit (HOSPITAL_BASED_OUTPATIENT_CLINIC_OR_DEPARTMENT_OTHER): Payer: Self-pay

## 2023-11-15 ENCOUNTER — Other Ambulatory Visit: Payer: Self-pay

## 2023-11-15 ENCOUNTER — Encounter (HOSPITAL_BASED_OUTPATIENT_CLINIC_OR_DEPARTMENT_OTHER): Payer: Self-pay

## 2023-11-15 ENCOUNTER — Other Ambulatory Visit: Payer: Self-pay | Admitting: Orthopedic Surgery

## 2023-11-15 DIAGNOSIS — Z7982 Long term (current) use of aspirin: Secondary | ICD-10-CM | POA: Insufficient documentation

## 2023-11-15 DIAGNOSIS — Z79899 Other long term (current) drug therapy: Secondary | ICD-10-CM | POA: Insufficient documentation

## 2023-11-15 DIAGNOSIS — M549 Dorsalgia, unspecified: Secondary | ICD-10-CM | POA: Diagnosis present

## 2023-11-15 DIAGNOSIS — M5432 Sciatica, left side: Secondary | ICD-10-CM | POA: Insufficient documentation

## 2023-11-15 MED ORDER — HYDROMORPHONE HCL 1 MG/ML IJ SOLN
2.0000 mg | Freq: Once | INTRAMUSCULAR | Status: AC
  Administered 2023-11-15: 2 mg via INTRAMUSCULAR
  Filled 2023-11-15: qty 2

## 2023-11-15 MED ORDER — HYDROCODONE-ACETAMINOPHEN 5-325 MG PO TABS
1.0000 | ORAL_TABLET | Freq: Four times a day (QID) | ORAL | 0 refills | Status: DC | PRN
  Filled 2023-11-15: qty 14, 4d supply, fill #0

## 2023-11-15 NOTE — ED Notes (Signed)
 Patient asked for time to let the pain medicine begin working before being discharged/leaving the room

## 2023-11-15 NOTE — Discharge Instructions (Signed)
 Continue take the prednisone as directed.  Take the hydrocodone  1 to 2 tablets as needed for pain.  Rest off your feet as much as possible.  Work note provided.  Make an appointment to follow-up with your orthopedic doctor.  Return for any involvement of the right leg or bowel incontinence.

## 2023-11-15 NOTE — ED Triage Notes (Signed)
 Pt states that she has a herniated disc and the pain is getting worse. States that she left messages for the doctor but didn't hear anything back. States that pain is shooting down left leg.

## 2023-11-15 NOTE — ED Provider Notes (Signed)
 [facility] Provider Note   CSN: [id number] Arrival date & time: 11/15/23  0757     History  Chief Complaint  Patient presents with   Back Pain    Deborah Chaney is a 58 y.o. female.  Patient with longstanding history of left-sided sciatica.  Followed by orthopedic surgeon Dr. Sulema Endo.  Patient's had 2 injections.  Last saw him beginning of this week was started on a 21-day steroid Dosepak.  Left back pain got worse on Wednesday.  Radiating down left leg.  More discomfort than usual.  No right lower extremity abnormalities no incontinence.  Patient had an MRI back in November.  Sounds like they are thinking in terms of surgery for herniated disc.  Pain does radiate down the left leg.  And there is some numbness.       Home Medications Prior to Admission medications   Medication Sig Start Date End Date Taking? Authorizing Provider  HYDROcodone -acetaminophen  (NORCO/VICODIN) 5-325 MG tablet Take 1 tablet by mouth every 6 (six) hours as needed for moderate pain (pain score 4-6). 11/15/23  Yes Aleigha Gilani, MD  aspirin  81 MG EC tablet Take 1 tablet by mouth daily.    [provider]  diazepam  (VALIUM ) 5 MG tablet Take one tablet by mouth with food one hour prior to procedure. May repeat 30 minutes prior if needed. 10/07/23   Williams, Megan E, NP  diclofenac  (VOLTAREN ) 75 MG EC tablet TAKE 1 TABLET(75 MG) BY MOUTH TWICE DAILY WITH A MEAL 10/16/23   Diedra Fowler, MD  melatonin 5 MG TABS Take 5 mg by mouth at bedtime as needed (sleep).    [provider]  methylPREDNISolone  (MEDROL  DOSEPAK) 4 MG TBPK tablet Take as prescribed on the box 11/11/23   Diedra Fowler, MD  pregabalin  (LYRICA ) 75 MG capsule Take 1 capsule (75 mg total) by mouth 2 (two) times daily. 08/13/23 09/12/23  Diedra Fowler, MD  promethazine  (PHENERGAN ) 25 MG tablet Take 1 tablet (25 mg total) by mouth every 8 (eight) hours as needed for nausea or  vomiting. 11/02/15 05/29/20  Hyatt, Max T, DPM      Allergies    Penicillins and Shrimp (diagnostic)    Review of Systems   Review of Systems  Constitutional:  Negative for chills and fever.  HENT:  Negative for ear pain and sore throat.   Eyes:  Negative for pain and visual disturbance.  Respiratory:  Negative for cough and shortness of breath.   Cardiovascular:  Negative for chest pain and palpitations.  Gastrointestinal:  Negative for abdominal pain and vomiting.  Genitourinary:  Negative for dysuria and hematuria.  Musculoskeletal:  Positive for back pain. Negative for arthralgias.  Skin:  Negative for color change and rash.  Neurological:  Positive for numbness. Negative for seizures and syncope.  All other systems reviewed and are negative.   Physical Exam Updated Vital Signs BP (!) 157/100 (BP Location: Right Arm)   Pulse 71   Temp 98.5 F (36.9 C) (Oral)   Resp 18   Ht 1.6 m (5\' 3" )   Wt 97.1 kg   SpO2 99%   BMI 37.91 kg/m  Physical Exam Vitals and nursing note reviewed.  Constitutional:      General: She is not in acute distress.    Appearance: Normal appearance. She is well-developed.  HENT:     Head: Normocephalic and atraumatic.  Eyes:     Extraocular Movements: Extraocular movements intact.  Conjunctiva/sclera: Conjunctivae normal.     Pupils: Pupils are equal, round, and reactive to light.  Cardiovascular:     Rate and Rhythm: Normal rate and regular rhythm.     Heart sounds: No murmur heard. Pulmonary:     Effort: Pulmonary effort is normal. No respiratory distress.     Breath sounds: Normal breath sounds.  Abdominal:     Palpations: Abdomen is soft.     Tenderness: There is no abdominal tenderness.  Musculoskeletal:        General: No swelling.     Cervical back: Neck supple.  Skin:    General: Skin is warm and dry.     Capillary Refill: Capillary refill takes less than 2 seconds.  Neurological:     General: No focal deficit present.      Mental Status: She is alert and oriented to person, place, and time.     Sensory: No sensory deficit.     Motor: No weakness.     Comments: Clinically on exam no obvious numbness.  Good strength to the toes.  Psychiatric:        Mood and Affect: Mood normal.     ED Results / Procedures / Treatments   Labs (all labs ordered are listed, but only abnormal results are displayed) Labs Reviewed - No data to display  EKG None  Radiology No results found.  Procedures Procedures    Medications Ordered in ED Medications  HYDROmorphone  (DILAUDID ) injection 2 mg (has no administration in time range)    ED Course/ Medical Decision Making/ A&P                                 Medical Decision Making Risk Prescription drug management.   Symptoms consistent with left-sided sciatica.  Will give a IM injection of hydromorphone  here.  And then hydrocodone  to take at home as well as continuing her steroid.  Recommend follow back up with Dr. Sulema Endo.  Work note provided to be out of work all week so that she can rest.  Precautions provided. Final Clinical Impression(s) / ED Diagnoses Final diagnoses:  Sciatica of left side    Rx / DC Orders ED Discharge Orders          Ordered    HYDROcodone -acetaminophen  (NORCO/VICODIN) 5-325 MG tablet  Every 6 hours PRN        11/15/23 0826              Elim Peale, MD 11/15/23 [phone]

## 2023-11-18 ENCOUNTER — Telehealth: Payer: Self-pay | Admitting: Orthopedic Surgery

## 2023-11-18 MED ORDER — HYDROCODONE-ACETAMINOPHEN 5-325 MG PO TABS: 1.0000 | ORAL_TABLET | ORAL | 0 refills | Status: DC | PRN

## 2023-11-18 NOTE — Telephone Encounter (Signed)
 Orthopedic Telephone Note  Spoke with patient on the telephone this morning.  She continues to have severe low back pain radiating into the posterior thigh and leg on the left side.  She has an L5/S1 disc herniation.  She has now tried Tylenol , diclofenac , tizanidine, oral steroids, Lyrica , lumbar steroid injections, narcotics, PT.  She has now completed 6 weeks of physical therapy from 10/09/2023 to 11/12/2023 without any improvement in her symptoms.  In fact her symptoms have gotten worse recently.  I explained that she has tried all the conservative treatments that are typically tried for disc herniations and radiculopathy.  Her pain is now to the point that she is requiring narcotics.  Accordingly, I discussed surgery as an option in the form of an L5/S1 microdiscectomy.  The risks, benefits, alternatives were covered with her.  Will work to get her on the schedule at a time that is mutually convenient.   Diedra Fowler, MD Orthopedic Surgeon

## 2023-11-19 MED ORDER — HYDROCODONE-ACETAMINOPHEN 5-325 MG PO TABS: 1.0000 | ORAL_TABLET | ORAL | 0 refills | Status: AC | PRN

## 2023-11-25 ENCOUNTER — Other Ambulatory Visit: Payer: Self-pay

## 2023-11-25 ENCOUNTER — Observation Stay (HOSPITAL_COMMUNITY)

## 2023-11-25 ENCOUNTER — Encounter (HOSPITAL_COMMUNITY): Admission: EM | Disposition: A | Discharge status: Home / Self Care | Payer: Self-pay | Source: Home / Self Care

## 2023-11-25 ENCOUNTER — Encounter (HOSPITAL_COMMUNITY): Payer: Self-pay

## 2023-11-25 ENCOUNTER — Observation Stay (HOSPITAL_COMMUNITY)
Admission: EM | Admit: 2023-11-25 | Discharge: 2023-11-27 | Disposition: A | Discharge status: Home / Self Care | Attending: Emergency Medicine | Admitting: Emergency Medicine

## 2023-11-25 DIAGNOSIS — M5116 Intervertebral disc disorders with radiculopathy, lumbar region: Secondary | ICD-10-CM | POA: Diagnosis not present

## 2023-11-25 DIAGNOSIS — M5117 Intervertebral disc disorders with radiculopathy, lumbosacral region: Secondary | ICD-10-CM | POA: Diagnosis present

## 2023-11-25 DIAGNOSIS — I10 Essential (primary) hypertension: Secondary | ICD-10-CM

## 2023-11-25 DIAGNOSIS — M5126 Other intervertebral disc displacement, lumbar region: Secondary | ICD-10-CM | POA: Diagnosis not present

## 2023-11-25 DIAGNOSIS — M5416 Radiculopathy, lumbar region: Secondary | ICD-10-CM | POA: Diagnosis not present

## 2023-11-25 HISTORY — PX: LUMBAR LAMINECTOMY/DECOMPRESSION MICRODISCECTOMY: SHX5026

## 2023-11-25 LAB — BASIC METABOLIC PANEL WITH GFR
Anion gap: 8 (ref 5–15)
BUN: 9 mg/dL (ref 6–20)
CO2: 22 mmol/L (ref 22–32)
Calcium: 9.8 mg/dL (ref 8.9–10.3)
Chloride: 108 mmol/L (ref 98–111)
Creatinine, Ser: 0.95 mg/dL (ref 0.44–1.00)
GFR, Estimated: >60 mL/min (ref >60)
Glucose, Bld: 112 mg/dL — ABNORMAL HIGH (ref 70–99)
Potassium: 3.6 mmol/L (ref 3.5–5.1)
Sodium: 138 mmol/L (ref 135–145)

## 2023-11-25 LAB — CBC
HCT: 46.1 % — ABNORMAL HIGH (ref 36.0–46.0)
Hemoglobin: 15.3 g/dL — ABNORMAL HIGH (ref 12.0–15.0)
MCH: 29.8 pg (ref 26.0–34.0)
MCHC: 33.2 g/dL (ref 30.0–36.0)
MCV: 89.9 fL (ref 80.0–100.0)
Platelets: 321 10*3/uL (ref 150–400)
RBC: 5.13 MIL/uL — ABNORMAL HIGH (ref 3.87–5.11)
RDW: 12.4 % (ref 11.5–15.5)
WBC: 11.2 10*3/uL — ABNORMAL HIGH (ref 4.0–10.5)
nRBC: 0 % (ref 0.0–0.2)

## 2023-11-25 SURGERY — LUMBAR LAMINECTOMY/DECOMPRESSION MICRODISCECTOMY 1 LEVEL
Anesthesia: General

## 2023-11-25 MED ORDER — OXYCODONE HCL 5 MG PO TABS
10.0000 mg | ORAL_TABLET | Freq: Once | ORAL | Status: AC
  Administered 2023-11-25: 10 mg via ORAL
  Filled 2023-11-25: qty 2

## 2023-11-25 MED ORDER — CEFAZOLIN SODIUM-DEXTROSE 2-4 GM/100ML-% IV SOLN
2.0000 g | INTRAVENOUS | Status: AC
  Administered 2023-11-25: 2 g via INTRAVENOUS

## 2023-11-25 MED ORDER — METHYLPREDNISOLONE ACETATE 40 MG/ML IJ SUSP
INTRAMUSCULAR | Status: DC | PRN
  Administered 2023-11-25: 40 mg

## 2023-11-25 MED ORDER — PHENYLEPHRINE HCL-NACL 20-0.9 MG/250ML-% IV SOLN
INTRAVENOUS | Status: DC | PRN
  Administered 2023-11-25: 20 ug/min via INTRAVENOUS

## 2023-11-25 MED ORDER — LIDOCAINE 2% (20 MG/ML) 5 ML SYRINGE
INTRAMUSCULAR | Status: DC | PRN
  Administered 2023-11-25: 100 mg via INTRAVENOUS

## 2023-11-25 MED ORDER — ROCURONIUM BROMIDE 10 MG/ML (PF) SYRINGE
PREFILLED_SYRINGE | INTRAVENOUS | Status: AC
  Filled 2023-11-25: qty 20

## 2023-11-25 MED ORDER — HYDROMORPHONE HCL 1 MG/ML IJ SOLN
0.5000 mg | INTRAMUSCULAR | Status: AC | PRN
  Administered 2023-11-26: 0.5 mg via INTRAVENOUS
  Filled 2023-11-25: qty 0.5

## 2023-11-25 MED ORDER — DEXAMETHASONE SODIUM PHOSPHATE 10 MG/ML IJ SOLN: 10.0000 mg | INTRAMUSCULAR | Status: DC

## 2023-11-25 MED ORDER — ACETAMINOPHEN 10 MG/ML IV SOLN
INTRAVENOUS | Status: AC
  Filled 2023-11-25: qty 100

## 2023-11-25 MED ORDER — FENTANYL CITRATE (PF) 250 MCG/5ML IJ SOLN
INTRAMUSCULAR | Status: DC | PRN
  Administered 2023-11-25 – 2023-11-26 (×3): 50 ug via INTRAVENOUS

## 2023-11-25 MED ORDER — DEXAMETHASONE SODIUM PHOSPHATE 10 MG/ML IJ SOLN
INTRAMUSCULAR | Status: DC | PRN
  Administered 2023-11-25: 4 mg via INTRAVENOUS

## 2023-11-25 MED ORDER — METHOCARBAMOL 500 MG PO TABS
500.0000 mg | ORAL_TABLET | Freq: Four times a day (QID) | ORAL | Status: DC
  Administered 2023-11-26 – 2023-11-27 (×4): 500 mg via ORAL
  Filled 2023-11-25 (×5): qty 1

## 2023-11-25 MED ORDER — ROCURONIUM BROMIDE 10 MG/ML (PF) SYRINGE
PREFILLED_SYRINGE | INTRAVENOUS | Status: DC | PRN
  Administered 2023-11-25: 20 mg via INTRAVENOUS
  Administered 2023-11-25: 100 mg via INTRAVENOUS
  Administered 2023-11-26: 20 mg via INTRAVENOUS

## 2023-11-25 MED ORDER — KETAMINE HCL 50 MG/5ML IJ SOSY
PREFILLED_SYRINGE | INTRAMUSCULAR | Status: DC | PRN
  Administered 2023-11-25: 10 mg via INTRAVENOUS
  Administered 2023-11-25: 25 mg via INTRAVENOUS
  Administered 2023-11-26: 5 mg via INTRAVENOUS
  Administered 2023-11-26: 10 mg via INTRAVENOUS

## 2023-11-25 MED ORDER — DEXMEDETOMIDINE HCL IN NACL 80 MCG/20ML IV SOLN
INTRAVENOUS | Status: DC | PRN
  Administered 2023-11-25 – 2023-11-26 (×3): 8 ug via INTRAVENOUS

## 2023-11-25 MED ORDER — LACTATED RINGERS IV SOLN: INTRAVENOUS | Status: DC | PRN

## 2023-11-25 MED ORDER — TRANEXAMIC ACID-NACL 1000-0.7 MG/100ML-% IV SOLN
1000.0000 mg | INTRAVENOUS | Status: AC
  Administered 2023-11-25: 1000 mg via INTRAVENOUS

## 2023-11-25 MED ORDER — DEXAMETHASONE SODIUM PHOSPHATE 10 MG/ML IJ SOLN
INTRAMUSCULAR | Status: AC
  Filled 2023-11-25: qty 1

## 2023-11-25 MED ORDER — GELATIN ABSORBABLE 100 EX MISC
CUTANEOUS | Status: DC | PRN
  Administered 2023-11-25: 1

## 2023-11-25 MED ORDER — 0.9 % SODIUM CHLORIDE (POUR BTL) OPTIME
TOPICAL | Status: DC | PRN
  Administered 2023-11-25: 1000 mL

## 2023-11-25 MED ORDER — PHENYLEPHRINE HCL (PRESSORS) 10 MG/ML IV SOLN
INTRAVENOUS | Status: AC
Start: 2023-11-25 — End: ?
  Filled 2023-11-25: qty 1

## 2023-11-25 MED ORDER — THROMBIN (RECOMBINANT) 20000 UNITS EX SOLR
CUTANEOUS | Status: AC
  Filled 2023-11-25: qty 20000

## 2023-11-25 MED ORDER — FENTANYL CITRATE (PF) 250 MCG/5ML IJ SOLN
INTRAMUSCULAR | Status: AC
Start: 2023-11-25 — End: ?
  Filled 2023-11-25: qty 5

## 2023-11-25 MED ORDER — PHENYLEPHRINE 80 MCG/ML (10ML) SYRINGE FOR IV PUSH (FOR BLOOD PRESSURE SUPPORT)
PREFILLED_SYRINGE | INTRAVENOUS | Status: DC | PRN
  Administered 2023-11-25: 300 ug via INTRAVENOUS
  Administered 2023-11-25: 80 ug via INTRAVENOUS
  Administered 2023-11-25 (×2): 160 ug via INTRAVENOUS

## 2023-11-25 MED ORDER — PROPOFOL 10 MG/ML IV BOLUS
INTRAVENOUS | Status: DC | PRN
  Administered 2023-11-25: 150 mg via INTRAVENOUS

## 2023-11-25 MED ORDER — MIDAZOLAM HCL 2 MG/2ML IJ SOLN
INTRAMUSCULAR | Status: AC
Start: 2023-11-25 — End: ?
  Filled 2023-11-25: qty 2

## 2023-11-25 MED ORDER — PHENYLEPHRINE HCL (PRESSORS) 10 MG/ML IV SOLN
INTRAVENOUS | Status: AC
  Filled 2023-11-25: qty 1

## 2023-11-25 MED ORDER — STERILE WATER FOR IRRIGATION IR SOLN
Status: DC | PRN
  Administered 2023-11-25: 1000 mL

## 2023-11-25 MED ORDER — METHYLPREDNISOLONE ACETATE 40 MG/ML IJ SUSP
INTRAMUSCULAR | Status: AC
Start: 2023-11-25 — End: ?
  Filled 2023-11-25: qty 1

## 2023-11-25 MED ORDER — ONDANSETRON HCL 4 MG/2ML IJ SOLN
4.0000 mg | Freq: Four times a day (QID) | INTRAMUSCULAR | Status: DC | PRN
  Administered 2023-11-26 (×2): 4 mg via INTRAVENOUS
  Filled 2023-11-25 (×2): qty 2

## 2023-11-25 MED ORDER — ALBUMIN HUMAN 5 % IV SOLN: INTRAVENOUS | Status: DC | PRN

## 2023-11-25 MED ORDER — BUPIVACAINE-EPINEPHRINE (PF) 0.25% -1:200000 IJ SOLN
INTRAMUSCULAR | Status: AC
Start: 2023-11-25 — End: ?
  Filled 2023-11-25: qty 30

## 2023-11-25 MED ORDER — DEXMEDETOMIDINE HCL IN NACL 80 MCG/20ML IV SOLN
INTRAVENOUS | Status: AC
  Filled 2023-11-25: qty 20

## 2023-11-25 MED ORDER — ONDANSETRON HCL 4 MG/2ML IJ SOLN
INTRAMUSCULAR | Status: AC
Start: 2023-11-25 — End: ?
  Filled 2023-11-25: qty 2

## 2023-11-25 MED ORDER — BUPIVACAINE-EPINEPHRINE (PF) 0.25% -1:200000 IJ SOLN
INTRAMUSCULAR | Status: DC | PRN
  Administered 2023-11-25: 30 mL

## 2023-11-25 MED ORDER — OXYCODONE HCL 5 MG PO TABS
5.0000 mg | ORAL_TABLET | ORAL | Status: DC | PRN
  Administered 2023-11-26 – 2023-11-27 (×4): 10 mg via ORAL
  Filled 2023-11-25 (×4): qty 2

## 2023-11-25 MED ORDER — PHENYLEPHRINE 80 MCG/ML (10ML) SYRINGE FOR IV PUSH (FOR BLOOD PRESSURE SUPPORT)
PREFILLED_SYRINGE | INTRAVENOUS | Status: AC
  Filled 2023-11-25: qty 20

## 2023-11-25 MED ORDER — MIDAZOLAM HCL 2 MG/2ML IJ SOLN
INTRAMUSCULAR | Status: DC | PRN
  Administered 2023-11-25: 2 mg via INTRAVENOUS

## 2023-11-25 MED ORDER — POLYETHYLENE GLYCOL 3350 17 G PO PACK
17.0000 g | PACK | Freq: Every day | ORAL | Status: DC
  Administered 2023-11-27: 17 g via ORAL
  Filled 2023-11-25 (×2): qty 1

## 2023-11-25 MED ORDER — KETAMINE HCL 50 MG/5ML IJ SOSY
PREFILLED_SYRINGE | INTRAMUSCULAR | Status: AC
  Filled 2023-11-25: qty 5

## 2023-11-25 MED ORDER — LIDOCAINE 2% (20 MG/ML) 5 ML SYRINGE
INTRAMUSCULAR | Status: AC
Start: 2023-11-25 — End: ?
  Filled 2023-11-25: qty 5

## 2023-11-25 MED ORDER — ONDANSETRON HCL 4 MG PO TABS: 4.0000 mg | ORAL_TABLET | Freq: Four times a day (QID) | ORAL | Status: DC | PRN

## 2023-11-25 MED ORDER — SENNA 8.6 MG PO TABS
1.0000 | ORAL_TABLET | Freq: Two times a day (BID) | ORAL | Status: DC
  Administered 2023-11-26 – 2023-11-27 (×3): 8.6 mg via ORAL
  Filled 2023-11-25 (×3): qty 1

## 2023-11-25 MED ORDER — ACETAMINOPHEN 500 MG PO TABS
1000.0000 mg | ORAL_TABLET | Freq: Three times a day (TID) | ORAL | Status: DC
  Administered 2023-11-26 – 2023-11-27 (×3): 1000 mg via ORAL
  Filled 2023-11-25 (×4): qty 2

## 2023-11-25 SURGICAL SUPPLY — 47 items
BENZOIN TINCTURE PRP APPL 2/3 (GAUZE/BANDAGES/DRESSINGS) ×2 IMPLANT
BUR NEURO DRILL SOFT 3.0X3.8M (BURR) ×2 IMPLANT
CANISTER SUCTION 3000ML PPV (SUCTIONS) ×2 IMPLANT
CLSR STERI-STRIP ANTIMIC 1/2X4 (GAUZE/BANDAGES/DRESSINGS) ×2 IMPLANT
COVER MAYO STAND STRL (DRAPES) IMPLANT
COVER SURGICAL LIGHT HANDLE (MISCELLANEOUS) ×2 IMPLANT
DERMABOND ADVANCED .7 DNX12 (GAUZE/BANDAGES/DRESSINGS) IMPLANT
DRAPE C-ARM 42X72 X-RAY (DRAPES) ×2 IMPLANT
DRAPE MICROSCOPE LEICA (MISCELLANEOUS) IMPLANT
DRAPE MICROSCOPE LEICA 54X105 (DRAPES) ×2 IMPLANT
DRAPE SURG 17X23 STRL (DRAPES) ×8 IMPLANT
DRAPE UTILITY XL STRL (DRAPES) ×4 IMPLANT
DRESSING MEPILEX FLEX 4X4 (GAUZE/BANDAGES/DRESSINGS) ×2 IMPLANT
DRSG TEGADERM 4X10 (GAUZE/BANDAGES/DRESSINGS) ×2 IMPLANT
DRSG TEGADERM 4X4.75 (GAUZE/BANDAGES/DRESSINGS) IMPLANT
DURAPREP 26ML APPLICATOR (WOUND CARE) ×2 IMPLANT
ELECT COATED BLADE 2.86 ST (ELECTRODE) ×2 IMPLANT
ELECT PENCIL ROCKER SW 15FT (MISCELLANEOUS) ×2 IMPLANT
ELECTRODE REM PT RTRN 9FT ADLT (ELECTROSURGICAL) ×2 IMPLANT
GAUZE SPONGE 4X4 12PLY STRL (GAUZE/BANDAGES/DRESSINGS) ×2 IMPLANT
GLOVE BIO SURGEON STRL SZ7.5 (GLOVE) ×2 IMPLANT
GLOVE BIOGEL PI IND STRL 6.5 (GLOVE) IMPLANT
GLOVE BIOGEL PI IND STRL 7.0 (GLOVE) IMPLANT
GLOVE INDICATOR 8.0 STRL GRN (GLOVE) ×2 IMPLANT
GLOVE SURG SS PI 7.0 STRL IVOR (GLOVE) IMPLANT
GOWN STRL REUS W/ TWL XL LVL3 (GOWN DISPOSABLE) ×2 IMPLANT
KIT BASIN OR (CUSTOM PROCEDURE TRAY) ×2 IMPLANT
KIT POSITION SURG JACKSON T1 (MISCELLANEOUS) ×2 IMPLANT
KIT TURNOVER KIT B (KITS) ×2 IMPLANT
NDL 22X1.5 STRL (OR ONLY) (MISCELLANEOUS) ×2 IMPLANT
NDL HYPO 22X1.5 SAFETY MO (MISCELLANEOUS) IMPLANT
NEEDLE 22X1.5 STRL (OR ONLY) (MISCELLANEOUS) ×1 IMPLANT
NEEDLE HYPO 22X1.5 SAFETY MO (MISCELLANEOUS) ×1 IMPLANT
NS IRRIG 1000ML POUR BTL (IV SOLUTION) ×2 IMPLANT
PACK LAMINECTOMY ORTHO (CUSTOM PROCEDURE TRAY) ×2 IMPLANT
PATTIES SURGICAL .5 X.5 (GAUZE/BANDAGES/DRESSINGS) IMPLANT
SPONGE SURGIFOAM ABS GEL 100 (HEMOSTASIS) IMPLANT
SPONGE T-LAP 4X18 ~~LOC~~+RFID (SPONGE) IMPLANT
SUCTION TUBE FRAZIER 10FR DISP (SUCTIONS) ×2 IMPLANT
SUT MNCRL AB 3-0 PS2 18 (SUTURE) IMPLANT
SUT VIC AB 0 CT1 18XCR BRD8 (SUTURE) ×2 IMPLANT
SUT VIC AB 2-0 CT1 18 (SUTURE) ×2 IMPLANT
SUT VIC AB 3-0 PS2 18XBRD (SUTURE) ×2 IMPLANT
TOWEL GREEN STERILE (TOWEL DISPOSABLE) ×2 IMPLANT
TOWEL GREEN STERILE FF (TOWEL DISPOSABLE) ×2 IMPLANT
TUBING FEATHERFLOW (TUBING) ×2 IMPLANT
WATER STERILE IRR 1000ML POUR (IV SOLUTION) ×2 IMPLANT

## 2023-11-25 NOTE — ED Triage Notes (Signed)
 Pt came in via POV d/t back that has been going on "a while" & she was supposed to have surgery soon & was told by her provider to get re-evaluated if it worsens. Pt states her Lower Lt side of her back is where the pain is at & rates it 9/10 during triage. Colette Davies, MD told her for him to be notified if she comes in to ED (per pt).

## 2023-11-25 NOTE — ED Provider Notes (Signed)
 [place] [facility] Provider Note   CSN: [id number] Arrival date & time: 11/25/23  1450     History  Chief Complaint  Patient presents with   Back Pain    Deborah Chaney is a 58 y.o. female Pt complains of low back pain acute on chronic, she is scheduled for laminectomy and microdiscectomy. She spoke with Dr. Colette Davies who told her to come to the ED and notify the provider, she was under the impression that her surgical appointment was being moved up till tonight.    Back Pain      Home Medications Prior to Admission medications   Medication Sig Start Date End Date Taking? Authorizing Provider  aspirin  81 MG EC tablet Take 1 tablet by mouth daily.    [provider]  diazepam  (VALIUM ) 5 MG tablet Take one tablet by mouth with food one hour prior to procedure. May repeat 30 minutes prior if needed. 10/07/23   Williams, Megan E, NP  diclofenac  (VOLTAREN ) 75 MG EC tablet TAKE 1 TABLET(75 MG) BY MOUTH TWICE DAILY WITH A MEAL 11/15/23   Diedra Fowler, MD  melatonin 5 MG TABS Take 5 mg by mouth at bedtime as needed (sleep).    [provider]  methylPREDNISolone  (MEDROL  DOSEPAK) 4 MG TBPK tablet Take as prescribed on the box 11/11/23   Diedra Fowler, MD  pregabalin  (LYRICA ) 75 MG capsule Take 1 capsule (75 mg total) by mouth 2 (two) times daily. 08/13/23 09/12/23  Diedra Fowler, MD  promethazine  (PHENERGAN ) 25 MG tablet Take 1 tablet (25 mg total) by mouth every 8 (eight) hours as needed for nausea or vomiting. 11/02/15 05/29/20  Hyatt, Max T, DPM      Allergies    Penicillins and Shrimp (diagnostic)    Review of Systems   Review of Systems  Musculoskeletal:  Positive for back pain.  All other systems reviewed and are negative.   Physical Exam Updated Vital Signs BP (!) 127/107 (BP Location: Right Arm)   Pulse (!) 114   Temp 98.2 F (36.8 C)   Resp 16   Ht 5\' 3"  (1.6 m)   Wt 97.1 kg   SpO2 96%   BMI 37.91  kg/m  Physical Exam Vitals and nursing note reviewed.  Constitutional:      General: She is not in acute distress.    Appearance: Normal appearance.  HENT:     Head: Normocephalic and atraumatic.  Eyes:     General:        Right eye: No discharge.        Left eye: No discharge.  Cardiovascular:     Rate and Rhythm: Regular rhythm. Tachycardia present.     Pulses: Normal pulses.     Heart sounds: No murmur heard.    No friction rub. No gallop.  Pulmonary:     Effort: Pulmonary effort is normal.     Breath sounds: Normal breath sounds.  Abdominal:     General: Bowel sounds are normal.     Palpations: Abdomen is soft.  Musculoskeletal:     Comments: Focal tenderness to the left lower back.  Decreased strength of lower extremities 4/5 secondary to pain  Skin:    General: Skin is warm and dry.     Capillary Refill: Capillary refill takes less than 2 seconds.  Neurological:     Mental Status: She is alert and oriented to person, place, and time.  Psychiatric:  Mood and Affect: Mood normal.        Behavior: Behavior normal.     ED Results / Procedures / Treatments   Labs (all labs ordered are listed, but only abnormal results are displayed) Labs Reviewed  CBC - Abnormal; Notable for the following components:      Result Value   WBC 11.2 (*)    RBC 5.13 (*)    Hemoglobin 15.3 (*)    HCT 46.1 (*)    All other components within normal limits  BASIC METABOLIC PANEL WITH GFR - Abnormal; Notable for the following components:   Glucose, Bld 112 (*)    All other components within normal limits  HIV ANTIBODY (ROUTINE TESTING W REFLEX)  TYPE AND SCREEN    EKG None  Radiology No results found.  Procedures Procedures    Medications Ordered in ED Medications  HYDROmorphone  (DILAUDID ) injection 0.5 mg (has no administration in time range)  acetaminophen  (TYLENOL ) tablet 1,000 mg (has no administration in time range)  oxyCODONE  (Oxy IR/ROXICODONE ) immediate  release tablet 5-10 mg (has no administration in time range)  methocarbamol  (ROBAXIN ) tablet 500 mg (has no administration in time range)  senna (SENOKOT) tablet 8.6 mg (has no administration in time range)  polyethylene glycol (MIRALAX  / GLYCOLAX ) packet 17 g (has no administration in time range)  dexamethasone  (DECADRON ) injection 10 mg (has no administration in time range)  tranexamic acid (CYKLOKAPRON) IVPB 1,000 mg (has no administration in time range)  ceFAZolin (ANCEF) IVPB 2g/100 mL premix (has no administration in time range)  ondansetron  (ZOFRAN ) tablet 4 mg (has no administration in time range)    Or  ondansetron  (ZOFRAN ) injection 4 mg (has no administration in time range)  oxyCODONE  (Oxy IR/ROXICODONE ) immediate release tablet 10 mg (10 mg Oral Given 11/25/23 1632)    ED Course/ Medical Decision Making/ A&P Clinical Course as of 11/25/23 1839  Mon Nov 25, 2023  1707 Direct admit to Dr. Sulema Endo once placed in room [CP]    Clinical Course User Index [CP] Nelly Banco, PA-C                                 Medical Decision Making Amount and/or Complexity of Data Reviewed Labs: ordered.  Risk Prescription drug management. Decision regarding hospitalization.   This patient is a 58 y.o. female  who presents to the ED for concern of history of lumbar radiculopathy, plan for discectomy and laminectomy with Dr. Sulema Endo, came to the ED for direct admission after speaking to Dr. Sulema Endo on the phone.   Differential diagnoses prior to evaluation: The emergent differential diagnosis includes, but is not limited to, worsening of her known radicular low back pain, versus infection, new compression fracture, versus other acute spinal etiology. This is not an exhaustive differential.   Past Medical History / Co-morbidities / Social History: History of lumbar radiculopathy, obesity, previous diverticulitis  Additional history: Chart reviewed. Pertinent results include: Reviewed  outside orthopedic notes  Physical Exam: Physical exam performed. The pertinent findings include: Overall neurovascularly intact other than some decreased strength likely secondary to pain in the left lower extremity, some midline and lumbar paraspinous muscle tenderness especially worse on the left paraspinous region.  Lab Tests/Imaging studies: I personally interpreted labs/imaging and the pertinent results include: Mild leukocytosis, white blood cells 11.2, hemoglobin mildly elevated 15.3.  BMP unremarkable.   Consults: Spoke with the orthopedic physician, Dr. Sulema Endo who agrees to  direct admission for this patient and plans to move up her surgery.   Disposition: After consideration of the diagnostic results and the patients response to treatment, I feel that patient would benefit from admission as discussed above.   Final Clinical Impression(s) / ED Diagnoses Final diagnoses:  None    Rx / DC Orders ED Discharge Orders     None         Stefan Edge 11/25/23 1839    Scarlette Currier, MD 11/26/23 1210

## 2023-11-25 NOTE — Anesthesia Procedure Notes (Signed)
 Procedure Name: Intubation Date/Time: 11/25/2023 10:09 PM  Performed by: Emmitt Harp, CRNAPre-anesthesia Checklist: Patient identified, Emergency Drugs available, Suction available and Patient being monitored Patient Re-evaluated:Patient Re-evaluated prior to induction Oxygen Delivery Method: Circle System Utilized Preoxygenation: Pre-oxygenation with 100% oxygen Induction Type: IV induction Ventilation: Mask ventilation without difficulty Laryngoscope Size: Mac and 3 Grade View: Grade I Tube type: Oral Tube size: 7.0 mm Number of attempts: 1 Airway Equipment and Method: Stylet and Oral airway Placement Confirmation: ETT inserted through vocal cords under direct vision, positive ETCO2 and breath sounds checked- equal and bilateral Secured at: 22 cm Tube secured with: Tape Dental Injury: Teeth and Oropharynx as per pre-operative assessment

## 2023-11-25 NOTE — ED Provider Triage Note (Signed)
 Emergency Medicine Provider Triage Evaluation Note  Deborah Chaney , a 58 y.o. female  was evaluated in triage.  Pt complains of low back pain acute on chronic, she is scheduled for laminectomy and microdiscectomy.  She spoke with Dr. Colette Davies who told her to come to the ED and notify the provider, she was under the impression that her surgical appointment was being moved up till tonight.  Review of Systems  Positive: Back pain Negative:   Physical Exam  BP (!) 127/107 (BP Location: Right Arm)   Pulse (!) 114   Temp 98.2 F (36.8 C)   Resp 16   Ht 5\' 3"  (1.6 m)   Wt 97.1 kg   SpO2 96%   BMI 37.91 kg/m  Gen:   Awake, no distress   Resp:  Normal effort  MSK:   Moves extremities without difficulty  Other:  Focal left lower back pain  Medical Decision Making  Medically screening exam initiated at 4:19 PM.  Appropriate orders placed.  Deborah Chaney was informed that the remainder of the evaluation will be completed by another provider, this initial triage assessment does not replace that evaluation, and the importance of remaining in the ED until their evaluation is complete.  Workup initiated in triage    Deborah Chaney, [place] 11/25/23 1621

## 2023-11-25 NOTE — Anesthesia Preprocedure Evaluation (Addendum)
 Anesthesia Evaluation  Patient identified by MRN, date of birth, ID band Patient awake    Reviewed: Allergy & Precautions, NPO status , Patient's Chart, lab work & pertinent test results  History of Anesthesia Complications (+) PONV and history of anesthetic complications  Airway Mallampati: II  TM Distance: >3 FB Neck ROM: Full    Dental no notable dental hx.    Pulmonary neg pulmonary ROS   Pulmonary exam normal        Cardiovascular hypertension,  Rhythm:Regular Rate:Normal     Neuro/Psych  Headaches  negative psych ROS   GI/Hepatic Neg liver ROS,,,  Endo/Other  negative endocrine ROS    Renal/GU negative Renal ROS  negative genitourinary   Musculoskeletal Lumbar radiculopathy    Abdominal Normal abdominal exam  (+)   Peds  Hematology Lab Results      Component                Value               Date                      WBC                      11.2 (H)            11/25/2023                HGB                      15.3 (H)            11/25/2023                HCT                      46.1 (H)            11/25/2023                MCV                      89.9                11/25/2023                PLT                      321                 11/25/2023             Lab Results      Component                Value               Date                      NA                       138                 11/25/2023                K                        3.6  11/25/2023                CO2                      22                  11/25/2023                GLUCOSE                  112 (H)             11/25/2023                BUN                      9                   11/25/2023                CREATININE               0.95                11/25/2023                CALCIUM                  9.8                 11/25/2023                EGFR                     67                  01/24/2021                 GFRNONAA                 >60                 11/25/2023              Anesthesia Other Findings   Reproductive/Obstetrics                             Anesthesia Physical Anesthesia Plan  ASA: 3  Anesthesia Plan: General   Post-op Pain Management: Ofirmev  IV (intra-op)* and Ketamine  IV*   Induction:   PONV Risk Score and Plan: 3 and Ondansetron , Dexamethasone , Midazolam  and Treatment may vary due to age or medical condition  Airway Management Planned: Mask and Oral ETT  Additional Equipment: None  Intra-op Plan:   Post-operative Plan: Extubation in OR  Informed Consent: I have reviewed the patients History and Physical, chart, labs and discussed the procedure including the risks, benefits and alternatives for the proposed anesthesia with the patient or authorized representative who has indicated his/her understanding and acceptance.     Dental advisory given  Plan Discussed with: CRNA  Anesthesia Plan Comments:        Anesthesia Quick Evaluation

## 2023-11-25 NOTE — H&P (Signed)
 Orthopedic Spine Surgery H&P Note  Assessment: Patient is a 58 y.o. female with low back and left leg pain consistent with radiculopathy from a L5/S1 disc herniation   Plan: -Out of bed as tolerated, activity as tolerated, no brace -Covered the risks with surgery this evening. After this conversation, patient elected to proceed.  -Written consent verified -Hold anticoagulation in anticipation of surgery -Ancef and TXA on all to OR -NPO for procedure -Site marked -To OR when ready  The patient has low back and left leg radicular symptoms. Her pain has gotten severe to the point that oxycodone  is not controlling it and she feels her left leg is getting weaker. The patient has tried conservative treatment now for over 3 months without any relief of her symptoms. She was scheduled to do this an elective procedure next week but could not wait and was concerned because her leg was felt to be getting weaker. Accordingly, discussed surgery in the form of L5/S1 microdiscectomy as a treatment option.  The risks of the surgery including but not limited to recurrent disc herniation, persistent pain, dural tear, nerve root injury, paralysis, infection, bleeding, fracture, instability, need for additional procedures, blindness, heart attack, dvt/pe, and death were discussed with the patient. The benefits of the surgery would be faster relief of the patient's radiating leg pain. I explained that back pain relief is not the goal of the surgery and it is not reliably alleviated with this surgery. The alternatives to surgical management were covered with the patient and included activity modification, physical therapy, over-the-counter pain medications, and injections.  All the patient's questions were answered to her satisfaction. After this discussion, the patient expressed understanding and elected to proceed with surgical intervention.     ___________________________________________________________________________  Chief Complaint: low back and left leg pain  History: Patient is 59 y.o. female who has been previously seen in the office for low back and radiating left leg pain.  She had tried multiple conservative treatments but was not getting any relief with those treatments.  She had sent me several messages with the last day that her pain has been worsening.  She said he she felt her left leg was getting weaker so she was instructed to go to the ER.  She reports that her pain is in her low back and radiating to the left lower extremity.  She feels the going into the posterior thigh and leg.  She said her symptoms have not changed in their distribution but the pain has intensified.  She said for the last 4 to 5 days she has been in bed and has been trying to use the oxycodone  to help with her pain.  She said the oxycodone  is no longer cutting it.  She said she cannot wait for surgery next week as the pain is so severe that she is not sleeping at night.  She has also felt that her left leg has been getting weaker.  No bowel or bladder incontinence.  No saddle anesthesia.   Review of systems: General: denies fevers and chills, myalgias Neurologic: denies recent changes in vision, slurred speech Abdomen: denies nausea, vomiting, hematemesis Respiratory: denies cough, shortness of breath  Past medical history: HTN OSA Diverticulitis   Allergies: penicillin   Past surgical history:  Tubal ligation Appendectomy Hysterectomy Partial colectomy   Social history: Denies use of nicotine product (smoking, vaping, patches, smokeless) Alcohol use: denies Denies recreational drug use  Family history: -reviewed and not pertinent lumbar discrimination   Physical  Exam:  BMI of 37.9  General: no acute distress, appears stated age Neurologic: alert, answering questions appropriately, following commands Cardiovascular: regular  rate, no cyanosis Respiratory: unlabored breathing on room air, symmetric chest rise Psychiatric: appropriate affect, normal cadence to speech   MSK (spine):  -Strength exam      Left  Right  EHL    4/5  5/5 TA    5/5  5/5 GSC    5/5  5/5 Knee extension  5/5  5/5 Knee flexion   5/5  5/5 Hip flexion   5/5  5/5  -Sensory exam    Sensation intact to light touch in L2-S1 nerve distributions of bilateral lower   Patient name: Deborah Chaney Patient MRN: [id number] Date: 11/25/23

## 2023-11-26 ENCOUNTER — Observation Stay (HOSPITAL_COMMUNITY)

## 2023-11-26 ENCOUNTER — Other Ambulatory Visit (HOSPITAL_COMMUNITY): Payer: Self-pay

## 2023-11-26 ENCOUNTER — Encounter (HOSPITAL_COMMUNITY): Payer: Self-pay | Admitting: Orthopedic Surgery

## 2023-11-26 DIAGNOSIS — M5126 Other intervertebral disc displacement, lumbar region: Secondary | ICD-10-CM

## 2023-11-26 LAB — HIV ANTIBODY (ROUTINE TESTING W REFLEX): HIV Screen 4th Generation wRfx: NONREACTIVE

## 2023-11-26 LAB — TYPE AND SCREEN
ABO/RH(D): O POS
Antibody Screen: NEGATIVE

## 2023-11-26 MED ORDER — SENNA 8.6 MG PO TABS
1.0000 | ORAL_TABLET | Freq: Two times a day (BID) | ORAL | 0 refills | Status: DC
  Filled 2023-11-26: qty 28, 14d supply, fill #0

## 2023-11-26 MED ORDER — HYDROMORPHONE HCL 1 MG/ML IJ SOLN: 0.2500 mg | INTRAMUSCULAR | Status: DC | PRN

## 2023-11-26 MED ORDER — CEFAZOLIN SODIUM-DEXTROSE 2-4 GM/100ML-% IV SOLN
2.0000 g | Freq: Four times a day (QID) | INTRAVENOUS | Status: AC
  Administered 2023-11-26 (×3): 2 g via INTRAVENOUS
  Filled 2023-11-26 (×3): qty 100

## 2023-11-26 MED ORDER — VANCOMYCIN HCL 1000 MG IV SOLR
INTRAVENOUS | Status: AC
  Filled 2023-11-26: qty 20

## 2023-11-26 MED ORDER — ACETAMINOPHEN 10 MG/ML IV SOLN
INTRAVENOUS | Status: DC | PRN
  Administered 2023-11-26: 1000 mg via INTRAVENOUS

## 2023-11-26 MED ORDER — POLYETHYLENE GLYCOL 3350 17 GM/SCOOP PO POWD
17.0000 g | Freq: Every day | ORAL | 0 refills | Status: DC
Start: 2023-11-27 — End: 2023-12-11
  Filled 2023-11-26: qty 238, 14d supply, fill #0

## 2023-11-26 MED ORDER — METHOCARBAMOL 500 MG PO TABS
500.0000 mg | ORAL_TABLET | Freq: Four times a day (QID) | ORAL | 0 refills | Status: DC
Start: 2023-11-26 — End: 2023-12-06
  Filled 2023-11-26: qty 40, 10d supply, fill #0

## 2023-11-26 MED ORDER — SUGAMMADEX SODIUM 200 MG/2ML IV SOLN
INTRAVENOUS | Status: DC | PRN
  Administered 2023-11-26 (×2): 100 mg via INTRAVENOUS

## 2023-11-26 MED ORDER — AMISULPRIDE (ANTIEMETIC) 5 MG/2ML IV SOLN: 10.0000 mg | Freq: Once | INTRAVENOUS | Status: DC | PRN

## 2023-11-26 MED ORDER — OXYCODONE HCL 5 MG PO TABS
5.0000 mg | ORAL_TABLET | ORAL | 0 refills | Status: DC | PRN
  Filled 2023-11-26: qty 40, 7d supply, fill #0

## 2023-11-26 MED ORDER — ONDANSETRON HCL 4 MG/2ML IJ SOLN
INTRAMUSCULAR | Status: DC | PRN
  Administered 2023-11-26: 4 mg via INTRAVENOUS

## 2023-11-26 MED ORDER — ACETAMINOPHEN 500 MG PO TABS
1000.0000 mg | ORAL_TABLET | Freq: Three times a day (TID) | ORAL | 0 refills | Status: DC
  Filled 2023-11-26: qty 126, 21d supply, fill #0

## 2023-11-26 MED ORDER — TRANEXAMIC ACID-NACL 1000-0.7 MG/100ML-% IV SOLN
1000.0000 mg | Freq: Once | INTRAVENOUS | Status: AC
  Administered 2023-11-26: 1000 mg via INTRAVENOUS
  Filled 2023-11-26: qty 100

## 2023-11-26 MED ORDER — VANCOMYCIN HCL 1000 MG IV SOLR
INTRAVENOUS | Status: DC | PRN
  Administered 2023-11-26: 1000 mg via TOPICAL

## 2023-11-26 NOTE — Progress Notes (Signed)
   11/26/23 1216  TOC Brief Assessment  Insurance and Status Reviewed [facility])  Patient has primary care physician Yes Arvid Latino MD)  Home environment has been reviewed from home with Spouse  Prior level of function: independent  Prior/Current Home Services No current home services  Social Drivers of Health Review SDOH reviewed no interventions necessary  Readmission risk has been reviewed Yes (N/A listed)  Transition of care needs no transition of care needs at this time   No TOC needs identified at this time. Please place [facility] consult should a need arise

## 2023-11-26 NOTE — Discharge Instructions (Signed)
 Orthopedic Surgery Discharge Instructions  Patient name: Deborah Chaney Procedure Performed: L5/S1 microdiscectomy Date of Surgery: 11/25/2023 Surgeon: Colette Davies, MD  Pre-operative Diagnosis: lumbar radiculopathy, lumbar disc herniation Post-operative Diagnosis: same as above  Discharged to: home Discharge Condition: stable  Activity: You should refrain from bending, lifting, or twisting with objects greater than ten pounds until six weeks after surgery. You are encouraged to walk as much as desired. You can perform household activities such as cleaning dishes, doing laundry, vacuuming, etc. as long as the ten-pound restriction is followed. You do not need to wear a brace during the post-operative period.   Incision Care: Your incision site has a dressing over it. That dressing should remain in place and dry at all times for a total of one week after surgery. After one week, you can remove the dressing. Underneath the dressing, you will find skin glue. You should leave the skin glue in place. It will fall off with time. Do not pick, rub, or scrub at it. Do not put cream or lotion over the surgical area. After one week and once the dressing is off, it is okay to let soap and water run over your incision. Again, do not pick, scrub, or rub at the skin glue when bathing. Do not submerge (e.g., take a bath, swim, go in a hot tub, etc.) until six weeks after surgery. There may be some bloody drainage from the incision into the dressing after surgery. This is normal. You do not need to replace the dressing. Continue to leave it in place for the one week as instructed above. Should the dressing become saturated with blood or drainage, please call the office for further instructions.   Medications: You have been prescribed oxycodone . This is a narcotic pain medication and should only be taken as prescribed. You should not drink alcohol or operate heavy machinery (including driving) while taking this  medication. The oxycodone  can cause constipation as a side effect. For that reason, you have been prescribed senna and miralax . These are both laxatives. You do not need to take this medication if you develop diarrhea. Should you remain constipated even while taking these medications, please increase the dose of miralax  to twice daily. Tylenol  has been prescribed to be taken every 8 hours, which will give you additional pain relief. Robaxin  is a muscle relaxer that has been prescribed to you for muscle spasm type pain. Take this medication as needed.   You can use over-the-counter NSAIDs (ibuprofen , Aleve, Celebrex , naproxen, meloxicam , etc.) for additional pain relief after this surgery. These medications are safe to take with the Tylenol  you have been prescribed. You should not take these medications if you have or have had kidney problems or gastrointestinal ulcers. Take these medications as instructed on the packaging.   In order to set expectations for opioid prescriptions, you will only be prescribed opioids for a total of six weeks after surgery and, at two-weeks after surgery, your opioid prescription will start to tapered (decreased dosage and number of pills). If you have ongoing need for opioid medication six weeks after surgery, you will be referred to pain management. If you are already established with a provider that is giving you opioid medications, you should schedule an appointment with them for six weeks after surgery if you feel you are going to need another prescription. State law only allows for opioid prescriptions one week at a time. If you are running out of opioid medication near the end of the week,  please call the office during business hours before running out so I can send you another prescription.   You may resume any home blood thinners (warfarin, lovenox , apixaban, plavix, xarelto, etc) 72 hours after your surgery. Take these medications as they were previously  prescribed.  Driving: You should not drive while taking narcotic pain medications. You should start getting back to driving slowly and you may want to try driving in a parking lot before doing anything more.   Diet: You are safe to resume your regular diet after surgery.   Reasons to Call the Office After Surgery: You should feel free to call the office with any concerns or questions you have in the post-operative period, but you should definitely notify the office if you develop: -shortness of breath, chest pain, or trouble breathing -excessive bleeding, drainage, redness, or swelling around the surgical site -fevers, chills, or pain that is getting worse with each passing day -persistent nausea or vomiting -new weakness in either leg -new or worsening numbness or tingling in either leg -numbness in the groin, bowel or bladder incontinence -other concerns about your surgery  Follow Up Appointments: You should have an office appointment scheduled for approximately two weeks after surgery. If you do not remember when this appointment is or do not already have it scheduled, please call the office to schedule.   Office Information:  -Colette Davies, MD -Phone number: [phone] -Address: [address] [place]  [address]       [place], [place] [address]

## 2023-11-26 NOTE — Op Note (Addendum)
 Orthopedic Spine Surgery Operative Report  Procedure: L5/S1 microdiscectomy  Modifier: none  Date of procedure: 11/25/2023  Patient name: Deborah Chaney MRN: [id number] DOB: 1966-05-07  Surgeon: Colette Davies, MD Assistant: none Pre-operative diagnosis: L5/S1 disc herniation, lumbar radiculopathy  Post-operative diagnosis: same as above Findings: L5/S1 herniated disc on the left abutting the S1 nerve root  Specimens: none Anesthesia: general EBL: 100cc Complications: none Pre-incision antibiotic: ancef TXA was given prior to incision as well  Implants: none   Indication for procedure: Patient is a 58 y.o. female who presented to the office with low back and radiating left leg pain. Her MRI showed a L5/S1 disc herniation which was consistent with her S1 radicular symptoms. The patient had tried conservative treatments that did not provide any lasting relief. Her pain got severe enough that narcotics were not controlling her pain and she felt her left leg was getting weaker, so she presented to the ER. She had been previously scheduled for surgery, but she wanted to do it sooner with how severe her pain was so plans were made for operative intervention on 11/25/2023. L5/S1 microdiscectomy had previously been presented as a treatment option to her in the office and was again presented as a treatment option while she was in the emergency department. The risks of the surgery including but not limited to recurrent disc herniation, persistent pain, dural tear, nerve root injury, spinal cord injury, infection, bleeding, fracture, instability, need for additional procedures, heart attack, dvt/pe, and death were discussed with the patient. The benefits of the surgery would be faster relief of the patient's radiating leg pain. Explained that this symptomatic relief is for leg pain and the surgery will not necessarily help any back pain. The alternatives to surgical management were covered with the  patient and included activity modification, physical therapy, over-the-counter pain medications, and injections.  All the patient's questions were answered to her satisfaction. After this discussion, the patient expressed understanding and elected to proceed with surgical intervention.  Procedure Description: The patient was met in the pre-operative holding area. The patient's identity and consent were verified. The operative site was marked. The patient's remaining questions about the surgery were answered. The patient was brought back to the operating room. General anesthesia was induced and an endotracheal tube was placed by the anesthesia staff. The patient was transferred to the prone [place] table in the prone position. All bony prominences were well padded. The head of the bed was slightly elevated and the eyes were free from compression by the face pillow. The surgical area was cleansed with alcohol. Fluoroscopy was then brought in to check rotation on the AP image and to mark the levels on the lateral image. The patient's skin was then prepped and draped in a standard, sterile fashion. A time out was performed that identified the patient, the procedure, and the operative level. All team members agreed with what was stated in the time out.   A midline incision over the spinous processes of the previously marked levels was made and sharp dissection was continued down through the skin and dermis. Electrocautery was then used to continue the midline dissection down to the level of the spinous process. Subperiosteal dissection was performed using electrocautery to expose the lamina out lateral to the facet joint capsule on the left side. Care was taken to not violate the facet joint capsule. A lateral fluoroscopic image was taken to confirm the level. Subperiosteal dissection with electrocautery was then done to expose all the remainder  of the lamina and pars interarticularis of L5 and S1. The operative  microscope was brought in at this time.   A high-speed matchstick burr was used to thin the hemilamina to the level of the ligamentum flavum. The lamina was thinned with the burr to the edge of the ligamentum flavum insertion. Care was taken to leave at least 8mm of pars interarticularis. A curved curette was used to develop a plane between the ligamentum and the lamina. A series of Kerrison rongeurs were used to remove the thinned lamina to complete the laminotomy. A curved curette was used to elevate the ligamentum flavum off of the thecal sac. A combination of Kerrison rongeurs and a pituitary were used to remove the ligamentum flavum overlying the thecal sac and nerve root in the area of the laminotomy. The S1 nerve root was seen at this point.  A penfield was used to mobilize the nerve root. A nerve root retractor was placed into the laminotomy site and around the traversing nerve root to mobilize it medially. The disc herniation was visualized. It was at the level of the disc and just caudal to it. A long-handle knife was used to create an annulotomy. A pituitary was used to remove the herniated disc fragment. A pituitary was used to remove further loose fragments. A up-biting pituitary was used to remove further more medial fragments. A nerve hook was placed into the annulotomy to attempt to free any other loose fragments. Several loose fragments were removed. A downgoing curette was used to push any compressive material away from the S1 nerve root. A pituitary was used to remove any fragments that were loose. A nerve hook was then placed back into the annulotomy and no further loose fragments were found. A woodsen was used to palpate the disc space and there was no remaining herniation. The disc was in line with the posterior vertebral bodies. A lateral fluoroscopic image was taken with a penfield showing that the discectomy had been performed from the posterior aspect of the L5 vertebral body to the  body of the S1 vertebra body.   The wound was copiously irrigated with sterile saline. 30cc of marcaine  was injected into the soft tissues. 500mg  of vancomycin powder was placed into the wound. The fascia was reapproximated with 0 vicryl suture. The subcutaneous fat was reapproximated with 0 vicryl suture. The deep dermal layer was reapproximated with 2-0 vicryl. The skin as closed with a 3-0 running monocryl. All counts were correct at the end of the case. Dermabond was applied over the incision. An island dressing was placed over the wound. The patient was transferred back to a bed and brought to the post-anesthesia care unit by anesthesia staff in stable condition.   Post-operative plan: The patient will recover in the post-anesthesia care unit with a plan to go to the floor afterwards given the late night recovery. She will get two more doses of ancef and another dose of TXA. The patient will be out of bed as tolerated with no brace. The patient will likely discharge to home tomorrow and will be seen in the office in approximately 2 weeks.    Colette Davies, MD Orthopedic Surgeon

## 2023-11-26 NOTE — Progress Notes (Signed)
 Orthopedic Surgery Post-operative Progress Note  Assessment: Patient is a 58 y.o. female who is currently admitted after undergoing L5/S1 microdiscectomy   Plan: -Operative plans complete -Drains: none -Out of bed as tolerated, no brace -No bending/lifting/twisting greater than 10 pounds -OT evaluate and treat -Pain control -Regular diet -No chemoprophylaxis for dvt or antiplatelets for 72 hours after surgery -Ancef x2 post-operative doses -Disposition: remain floor status  ___________________________________________________________________________   Subjective: No acute events overnight. Worked with OT today which she felt went well. Not having any radiating leg pain. Feels sensation in her left foot is improved. Is having soreness in her back. Oral medications are controlling the pain.   Objective:  General: no acute distress, appropriate affect Neurologic: drowsy, answering questions appropriately, following commands Respiratory: unlabored breathing on room air Skin: dressing clear/dry/intact  MSK (spine):  -Strength exam      Right  Left  EHL    5/5  4+/5 TA    5/5  5/5 GSC    5/5  5/5 Knee extension  5/5  5/5 Hip flexion   5/5  5/5  -Sensory exam    Sensation intact to light touch in L2-S1 nerve distributions of bilateral lower extremities   Patient name: Deborah Chaney Patient MRN: [id number] Date: 11/26/23

## 2023-11-26 NOTE — Brief Op Note (Signed)
 11/25/2023 - 11/26/2023  2:54 AM  PATIENT:  Deborah Chaney  58 y.o. female  PRE-OPERATIVE DIAGNOSIS:  Lumbar Radiculopathy  POST-OPERATIVE DIAGNOSIS:  Lumbar Radiculopathy  PROCEDURE:  Procedure(s) with comments: LUMBAR LAMINECTOMY/DECOMPRESSION MICRODISCECTOMY 1 LEVEL (N/A) - L5-S1 Microdisectomy  SURGEON:  Surgeons and Role:    Diedra Fowler, MD - Primary  PHYSICIAN ASSISTANT: none  ASSISTANTS: none   ANESTHESIA:   general  EBL:  100 mL   BLOOD ADMINISTERED:none  DRAINS: none   LOCAL MEDICATIONS USED:  MARCAINE      SPECIMEN:  No Specimen  DISPOSITION OF SPECIMEN:  N/A  COUNTS:  YES  TOURNIQUET:  NONE  DICTATION: .Note written in EPIC  PLAN OF CARE: Admit for overnight observation  PATIENT DISPOSITION:  PACU - hemodynamically stable.   Delay start of Pharmacological VTE agent (>24hrs) due to surgical blood loss or risk of bleeding: yes

## 2023-11-26 NOTE — Plan of Care (Signed)
  Problem: Clinical Measurements: Goal: Will remain free from infection Outcome: Progressing Goal: Diagnostic test results will improve Outcome: Progressing   Problem: Coping: Goal: Level of anxiety will decrease Outcome: Progressing   Problem: Elimination: Goal: Will not experience complications related to bowel motility Outcome: Progressing   Problem: Safety: Goal: Ability to remain free from injury will improve Outcome: Progressing   Problem: Skin Integrity: Goal: Risk for impaired skin integrity will decrease Outcome: Progressing

## 2023-11-26 NOTE — Evaluation (Signed)
 Occupational Therapy Evaluation and Discharge Patient Details Name: Deborah Chaney MRN: [id number] DOB: [birth year] Today's Date: 11/26/2023   History of Present Illness   Pt is a 58 yo female s/p lumbar laminectomy/decompression microdisectomy L5-S1. PHMx: HTN     Clinical Impressions This 58 yo female admitted and underwent above presents to acute OT with all education completed with pt and husband as well as post op back handout provided. We will D/C from acute OT.     If plan is discharge home, recommend the following:   A little help with walking and/or transfers;A little help with bathing/dressing/bathroom;Assistance with cooking/housework;Assist for transportation     Functional Status Assessment   Patient has had a recent decline in their functional status and demonstrates the ability to make significant improvements in function in a reasonable and predictable amount of time. (without futher need for skilled OT)     Equipment Recommendations   None recommended by OT      Precautions/Restrictions   Precautions Precautions: Back Precaution Booklet Issued: Yes (comment) Recall of Precautions/Restrictions: Intact Required Braces or Orthoses:  (no brace needed per MD note) Restrictions Weight Bearing Restrictions Per Provider Order: No     Mobility Bed Mobility Overal bed mobility: Modified Independent                  Transfers Overall transfer level: Modified independent Equipment used: Rolling walker (2 wheels)               General transfer comment: VCs for looking up for sit<>stand to keep back more straight; pt ambulated around 1/4 for 5N at RW level and CGA      Balance Overall balance assessment: Mild deficits observed, not formally tested                                         ADL either performed or assessed with clinical judgement   ADL                                         General  ADL Comments: Educated as well as provided handout and reviewd on use of 2 cups for brushing teeth, wet wipes for back peri care, using pillows or adjustable bed for positioning, building up sitting tolerance, crossing legs for socks and shoes off and on, using hand held shower head to keep from bending and twisitng in shower     Vision Patient Visual Report: No change from baseline              Pertinent Vitals/Pain Pain Assessment Pain Assessment: 0-10 Pain Score: 4  Pain Location: incisional Pain Descriptors / Indicators: Aching, Sore Pain Intervention(s): Limited activity within patient's tolerance, Monitored during session     Extremity/Trunk Assessment Upper Extremity Assessment Upper Extremity Assessment: Overall WFL for tasks assessed           Communication Communication Communication: No apparent difficulties   Cognition Arousal: Alert Behavior During Therapy: WFL for tasks assessed/performed Cognition: No apparent impairments                               Following commands: Intact       Cueing    Cueing Techniques: Verbal cues  Home Living Family/patient expects to be discharged to:: Private residence Living Arrangements: Spouse/significant other Available Help at Discharge: Family;Available 24 hours/day Type of Home: House Home Access: Stairs to enter [employer] of Steps: 1 onto porch and 1 into house Entrance Stairs-Rails: None Home Layout: Bed/bath upstairs;Two level Alternate Level Stairs-Number of Steps: stair lift to upstairs   Bathroom Shower/Tub: Walk-in shower;Curtain   Bathroom Toilet: Handicapped height     Home Equipment: Shower seat;Hand held shower head   Additional Comments: adjustable bed      Prior Functioning/Environment Prior Level of Function : Independent/Modified Independent;Driving                    OT Problem List: Decreased range of motion;Pain        OT  Goals(Current goals can be found in the care plan section)   Acute Rehab OT Goals Patient Stated Goal: maybe home today--depending on how she feels         AM-PAC OT "6 Clicks" Daily Activity     Outcome Measure Help from another person eating meals?: None Help from another person taking care of personal grooming?: A Little Help from another person toileting, which includes using toliet, bedpan, or urinal?: A Little Help from another person bathing (including washing, rinsing, drying)?: A Little Help from another person to put on and taking off regular upper body clothing?: A Little Help from another person to put on and taking off regular lower body clothing?: A Little 6 Click Score: 19   End of Session Equipment Utilized During Treatment: Gait belt;Rolling walker (2 wheels) Nurse Communication: Mobility status (pt nauseated and hot)  Activity Tolerance: Patient tolerated treatment well Patient left: in chair;with call bell/phone within reach;with family/visitor present  OT Visit Diagnosis: Unsteadiness on feet (R26.81);Pain Pain - part of body:  (incisional)                Time: 4132-4401 OT Time Calculation (min): 33 min Charges:  OT General Charges $OT Visit: 1 Visit OT Evaluation $OT Eval Moderate Complexity: 1 Mod OT Treatments $Self Care/Home Management : 8-22 mins  Merryl Abraham OT Acute Rehabilitation Services Office [phone]    Lenox Raider 11/26/2023, 12:48 PM

## 2023-11-26 NOTE — Discharge Summary (Signed)
 Orthopedic Surgery Discharge Summary  Patient name: Deborah Chaney Patient MRN: [id number] Admit today: 11/25/2023 Discharge date: 11/27/2023  Attending physician: Colette Davies, MD Final diagnosis: lumbar radiculopathy, L5/S1 disc herniation Findings: L5/S1 herniated disc on the left abutting the S1 nerve root   Hospital course: Patient is a 58 y.o. female who was admitted for intractable pain due to a lumbar disc herniation causing radiculopathy. She underwent L5/S1 microdiscectomy on 11/25/2023. The patient had significant pain immediately after surgery, but pain eventually was controlled with a multimodal regimen including oxycodone . The patient worked with physical therapy who recommended discharge to home. The patient was tolerating an oral diet without issue and was voiding spontaneously after surgery. The patient's vitals were stable on the day of discharge. The patient was medically ready for discharge and was discharge to home on post-operative day 2.  Instructions:   Orthopedic Surgery Discharge Instructions  Patient name: Deborah Chaney Procedure Performed: L5/S1 microdiscectomy Date of Surgery: 11/25/2023 Surgeon: Colette Davies, MD  Pre-operative Diagnosis: lumbar radiculopathy, lumbar disc herniation Post-operative Diagnosis: same as above  Discharged to: home Discharge Condition: stable  Activity: You should refrain from bending, lifting, or twisting with objects greater than ten pounds until six weeks after surgery. You are encouraged to walk as much as desired. You can perform household activities such as cleaning dishes, doing laundry, vacuuming, etc. as long as the ten-pound restriction is followed. You do not need to wear a brace during the post-operative period.   Incision Care: Your incision site has a dressing over it. That dressing should remain in place and dry at all times for a total of one week after surgery. After one week, you can remove the dressing.  Underneath the dressing, you will find skin glue. You should leave the skin glue in place. It will fall off with time. Do not pick, rub, or scrub at it. Do not put cream or lotion over the surgical area. After one week and once the dressing is off, it is okay to let soap and water run over your incision. Again, do not pick, scrub, or rub at the skin glue when bathing. Do not submerge (e.g., take a bath, swim, go in a hot tub, etc.) until six weeks after surgery. There may be some bloody drainage from the incision into the dressing after surgery. This is normal. You do not need to replace the dressing. Continue to leave it in place for the one week as instructed above. Should the dressing become saturated with blood or drainage, please call the office for further instructions.   Medications: You have been prescribed oxycodone . This is a narcotic pain medication and should only be taken as prescribed. You should not drink alcohol or operate heavy machinery (including driving) while taking this medication. The oxycodone  can cause constipation as a side effect. For that reason, you have been prescribed senna and miralax . These are both laxatives. You do not need to take this medication if you develop diarrhea. Should you remain constipated even while taking these medications, please increase the dose of miralax  to twice daily. Tylenol  has been prescribed to be taken every 8 hours, which will give you additional pain relief. Robaxin  is a muscle relaxer that has been prescribed to you for muscle spasm type pain. Take this medication as needed.   You can use over-the-counter NSAIDs (ibuprofen , Aleve, Celebrex , naproxen, meloxicam , etc.) for additional pain relief after this surgery. These medications are safe to take with the Tylenol  you have been  prescribed. You should not take these medications if you have or have had kidney problems or gastrointestinal ulcers. Take these medications as instructed on the packaging.    In order to set expectations for opioid prescriptions, you will only be prescribed opioids for a total of six weeks after surgery and, at two-weeks after surgery, your opioid prescription will start to tapered (decreased dosage and number of pills). If you have ongoing need for opioid medication six weeks after surgery, you will be referred to pain management. If you are already established with a provider that is giving you opioid medications, you should schedule an appointment with them for six weeks after surgery if you feel you are going to need another prescription. State law only allows for opioid prescriptions one week at a time. If you are running out of opioid medication near the end of the week, please call the office during business hours before running out so I can send you another prescription.   You may resume any home blood thinners (warfarin, lovenox , apixaban, plavix, xarelto, etc) 72 hours after your surgery. Take these medications as they were previously prescribed.  Driving: You should not drive while taking narcotic pain medications. You should start getting back to driving slowly and you may want to try driving in a parking lot before doing anything more.   Diet: You are safe to resume your regular diet after surgery.   Reasons to Call the Office After Surgery: You should feel free to call the office with any concerns or questions you have in the post-operative period, but you should definitely notify the office if you develop: -shortness of breath, chest pain, or trouble breathing -excessive bleeding, drainage, redness, or swelling around the surgical site -fevers, chills, or pain that is getting worse with each passing day -persistent nausea or vomiting -new weakness in either leg -new or worsening numbness or tingling in either leg -numbness in the groin, bowel or bladder incontinence -other concerns about your surgery  Follow Up Appointments: You should have an office  appointment scheduled for approximately two weeks after surgery. If you do not remember when this appointment is or do not already have it scheduled, please call the office to schedule.   Office Information:  -Colette Davies, MD -Phone number: [phone] -Address: [address]       [place], [place] [address]

## 2023-11-26 NOTE — Transfer of Care (Signed)
 Immediate Anesthesia Transfer of Care Note  Patient: Deborah Chaney  Procedure(s) Performed: LUMBAR LAMINECTOMY/DECOMPRESSION MICRODISCECTOMY 1 LEVEL  Patient Location: PACU  Anesthesia Type:General  Level of Consciousness: awake and drowsy  Airway & Oxygen Therapy: Patient Spontanous Breathing and Patient connected to nasal cannula oxygen  Post-op Assessment: Report given to RN and Post -op Vital signs reviewed and stable  Post vital signs: Reviewed and stable  Last Vitals:  Vitals Value Taken Time  BP 111/71 11/26/23 0245  Temp 37.2 C 11/26/23 0235  Pulse 116 11/26/23 0246  Resp 19 11/26/23 0246  SpO2 96 % 11/26/23 0246  Vitals shown include unfiled device data.  Last Pain:  Vitals:   11/26/23 0245  PainSc: 0-No pain         Complications: No notable events documented.

## 2023-11-26 NOTE — Progress Notes (Signed)
 Orthopedic Surgery Post-operative Progress Note  Assessment: Patient is a 58 y.o. female who is currently admitted after undergoing L5/S1 microdiscectomy   Plan: -Operative plans complete -Drains: none -Out of bed as tolerated, no brace -No bending/lifting/twisting greater than 10 pounds -OT evaluate and treat -Pain control -Regular diet -No chemoprophylaxis for dvt or antiplatelets for 72 hours after surgery -Ancef x2 post-operative doses -Disposition: to floor from PACU  ___________________________________________________________________________   Subjective: No acute events since surgery. Recovering in PACU. Pain controlled.   Objective:  General: no acute distress, appropriate affect Neurologic: drowsy, answering questions appropriately, following commands Respiratory: unlabored breathing on room air Skin: dressing clear/dry/intact  MSK (spine):  -Strength exam      Right  Left  EHL    5/5  5/5 TA    5/5  5/5 GSC    5/5  5/5 Knee extension  5/5  5/5 Hip flexion   5/5  5/5  -Sensory exam    Sensation intact to light touch in L2-S1 nerve distributions of bilateral lower extremities   Patient name: Deborah Chaney Patient MRN: [id number] Date: 11/26/23

## 2023-11-26 NOTE — Anesthesia Postprocedure Evaluation (Signed)
 Anesthesia Post Note  Patient: Deborah Chaney  Procedure(s) Performed: LUMBAR LAMINECTOMY/DECOMPRESSION MICRODISCECTOMY 1 LEVEL     Patient location during evaluation: PACU Anesthesia Type: General Level of consciousness: awake and alert Pain management: pain level controlled Vital Signs Assessment: post-procedure vital signs reviewed and stable Respiratory status: spontaneous breathing, nonlabored ventilation, respiratory function stable and patient connected to nasal cannula oxygen Cardiovascular status: blood pressure returned to baseline and stable Postop Assessment: no apparent nausea or vomiting Anesthetic complications: no   No notable events documented.  Last Vitals:  Vitals:   11/26/23 0338 11/26/23 0443  BP: 134/76 123/76  Pulse: (!) 106 (!) 109  Resp: 16 19  Temp: 36.9 C 36.9 C  SpO2: 99% 96%    Last Pain:  Vitals:   11/26/23 0443  TempSrc: Oral  PainSc:                  Valente Gaskin Izaha Shughart

## 2023-11-27 ENCOUNTER — Other Ambulatory Visit (HOSPITAL_COMMUNITY): Payer: Self-pay

## 2023-11-27 MED ORDER — OXYCODONE HCL 5 MG PO TABS: 5.0000 mg | ORAL_TABLET | ORAL | 0 refills | Status: AC | PRN

## 2023-11-27 MED ORDER — SENNA 8.6 MG PO TABS: 1.0000 | ORAL_TABLET | Freq: Two times a day (BID) | ORAL | 0 refills | Status: AC

## 2023-11-27 MED ORDER — POLYETHYLENE GLYCOL 3350 17 GM/SCOOP PO POWD: 17.0000 g | Freq: Every day | ORAL | 0 refills | Status: AC

## 2023-11-27 MED ORDER — ACETAMINOPHEN 500 MG PO TABS: 1000.0000 mg | ORAL_TABLET | Freq: Three times a day (TID) | ORAL | 0 refills | Status: AC

## 2023-11-27 MED ORDER — METHOCARBAMOL 500 MG PO TABS: 500.0000 mg | ORAL_TABLET | Freq: Four times a day (QID) | ORAL | 0 refills | Status: AC

## 2023-11-27 NOTE — Plan of Care (Signed)

## 2023-11-27 NOTE — TOC Transition Note (Signed)
 Transition of Care [facility]) - Discharge Note   Patient Details  Name: Deborah Chaney MRN: [id number] Date of Birth: [birth year]  Transition of Care [facility]) CM/SW Contact:  Eusebio High, RN Phone Number: 11/27/2023, 8:58 AM   Clinical Narrative:     Patient will DC to home today. No TOC needs identified. Patient will follow up as directed on AVS  Family to transport             Patient Goals and CMS Choice            Discharge Placement                       Discharge Plan and Services Additional resources added to the After Visit Summary for                                       Social Drivers of Health (SDOH) Interventions SDOH Screenings   Food Insecurity: No Food Insecurity (11/26/2023)  Housing: Low Risk  (11/26/2023)  Transportation Needs: No Transportation Needs (11/26/2023)  Utilities: Not At Risk (11/26/2023)  Depression (PHQ2-9): Medium Risk (08/24/2020)  Tobacco Use: Low Risk  (11/25/2023)     Readmission Risk Interventions     No data to display

## 2023-11-27 NOTE — Progress Notes (Signed)
 Orthopedic Surgery Post-operative Progress Note  Assessment: Patient is a 58 y.o. female who is currently admitted after undergoing L5/S1 microdiscectomy   Plan: -Operative plans complete -Drains: none -Out of bed as tolerated, no brace -No bending/lifting/twisting greater than 10 pounds -OT evaluate and treat -Pain control -Regular diet -No chemoprophylaxis for dvt or antiplatelets for 72 hours after surgery -Ancef x2 post-operative doses -Anticipate discharge to home today  ___________________________________________________________________________   Subjective: No acute events overnight. Pain in leg has improved significantly since surgery. Back pain has improved as well but still having significant back pain around her incision. Describes this as a burning pain. Oral medications are controlling her pain.   Objective:  General: no acute distress, appropriate affect Neurologic: sleeping but awakes to voice, answering questions appropriately, following commands Respiratory: unlabored breathing on room air Skin: dressing clear/dry/intact  MSK (spine):  -Strength exam      Right  Left  EHL    5/5  4+/5 TA    5/5  5/5 GSC    5/5  5/5 Knee extension  5/5  5/5 Hip flexion   5/5  5/5  -Sensory exam    Sensation intact to light touch in L2-S1 nerve distributions of bilateral lower extremities   Patient name: Deborah Chaney Patient MRN: [id number] Date: 11/27/23

## 2023-12-02 ENCOUNTER — Telehealth: Payer: Self-pay

## 2023-12-02 ENCOUNTER — Ambulatory Visit

## 2023-12-02 NOTE — Telephone Encounter (Signed)
 Pt called and said she would like her bandage changed. I scheduled her for a nurse visit

## 2023-12-02 NOTE — Telephone Encounter (Signed)
 Called and advised.

## 2023-12-06 ENCOUNTER — Encounter (HOSPITAL_COMMUNITY): Admission: RE | Payer: Self-pay | Source: Home / Self Care

## 2023-12-06 ENCOUNTER — Ambulatory Visit (HOSPITAL_COMMUNITY): Admission: RE | Admit: 2023-12-06 | Source: Home / Self Care | Admitting: Orthopedic Surgery

## 2023-12-06 DIAGNOSIS — Z01818 Encounter for other preprocedural examination: Secondary | ICD-10-CM

## 2023-12-06 SURGERY — LUMBAR LAMINECTOMY/DECOMPRESSION MICRODISCECTOMY 1 LEVEL
Anesthesia: General

## 2023-12-11 ENCOUNTER — Ambulatory Visit (INDEPENDENT_AMBULATORY_CARE_PROVIDER_SITE_OTHER): Admitting: Orthopedic Surgery

## 2023-12-11 DIAGNOSIS — Z9889 Other specified postprocedural states: Secondary | ICD-10-CM

## 2023-12-11 NOTE — Progress Notes (Signed)
 Orthopedic Surgery Post-operative Office Visit  Procedure: L5/S1 microdiscectomy Date of Surgery: 11/25/2023 (~2 weeks post-op)  Assessment: Patient is a 58 y.o. who has noticed significant improvement in her leg and back pain since surgery   Plan: -Operative plans complete -Out of bed as tolerated, no brace -No bending/lifting/twisting greater than 10 pounds -Pain management: tylenol  and robaxin  as needed -Okay to let soap/water  run over incision but do not submerge -Okay to return to work next week -Return to office in 4 weeks, x-rays needed at next visit: none  ___________________________________________________________________________   Subjective: Has been doing well since surgery.  She has been at home.  She has noticed significant improvement in her radiating leg pain.  She still has some decrease sensation over the posterolateral aspect of the leg and in the lateral foot.  No other numbness or paresthesias.  No pain rating into the right lower extremity.  She has not noticed any redness or drainage around her incision.  She has been ambulating without assistive devices.  She has decreased her use of the postoperative medications.  She said at this point she is just using the muscle relaxer.  Patient interested in returning to work next week.  Objective:  General: no acute distress, appropriate affect Neurologic: alert, answering questions appropriately, following commands Respiratory: unlabored breathing on room air Skin: incision is well-approximated with no erythema, induration, active/expressible drainage  MSK (spine):  -Strength exam      Left  Right  EHL    5/5  5/5 TA    5/5  5/5 GSC    5/5  5/5 Knee extension  5/5  5/5 Hip flexion   5/5  5/5  -Sensory exam    Sensation intact to light touch in L2-S1 nerve distributions of bilateral lower extremities (decreased in S1 distribution on the left)  Imaging: None obtained at today's visit   Patient name: Deborah Chaney Patient MRN: [id number] Date of visit: 12/11/23

## 2023-12-12 ENCOUNTER — Encounter: Payer: Self-pay | Admitting: Orthopedic Surgery

## 2023-12-16 ENCOUNTER — Other Ambulatory Visit: Payer: Self-pay | Admitting: Orthopedic Surgery

## 2023-12-19 ENCOUNTER — Encounter: Admitting: Orthopedic Surgery

## 2024-01-13 ENCOUNTER — Ambulatory Visit (INDEPENDENT_AMBULATORY_CARE_PROVIDER_SITE_OTHER): Admitting: Orthopedic Surgery

## 2024-01-13 DIAGNOSIS — Z9889 Other specified postprocedural states: Secondary | ICD-10-CM | POA: Diagnosis not present

## 2024-01-13 NOTE — Progress Notes (Signed)
 Orthopedic Surgery Post-operative Office Visit   Procedure: L5/S1 microdiscectomy Date of Surgery: 11/25/2023 (~6 weeks post-op)   Assessment: Patient is a 58 y.o. who has noticed resolution of her radiating leg pain. Has some lateral left hip pain that is consistent with trochanteric bursitis     Plan: -Operative plans complete -No spine specific precautions, activity as tolerated -For her trochanteric bursitis, talked about PT, home exercise program, injections as options. She wanted to try the home exercise program first so a printout on exercises was provided to her -Pain management: tylenol  as needed for pain relief -Okay to submerge wound at this time -Return to office in 6 weeks, x-rays needed at next visit: AP/lateral/flex/ex lumbar   ___________________________________________________________________________     Subjective: Patient has been doing well since she was last seen in the office.  She has not had no return of her radiating left leg pain.  She still has some decreased sensation and paresthesias over the posterolateral aspect of the leg and lateral foot.  She said they are now intermittent in nature and are not constant.  She is also reporting left lateral hip pain.  She says it is worse as she is sleeping on it at night.  She just started noticing this as she has been more active of late.  She has returned to work.  She is ambulating without assistive devices.  Has not noticed any redness or drainage around her incision.   Objective:   General: no acute distress, appropriate affect Neurologic: alert, answering questions appropriately, following commands Respiratory: unlabored breathing on room air Skin: incision is well healed with no erythema, induration, active/expressible drainage   MSK (spine):   -Strength exam                                                   Left                  Right   EHL                              5/5                  5/5 TA                                  5/5                  5/5 GSC                             5/5                  5/5 Knee extension            5/5                  5/5 Hip flexion                    5/5                  5/5   -Sensory exam  Sensation intact to light touch in L2-S1 nerve distributions of bilateral lower extremities   Imaging: None obtained at today's visit     Patient name: Deborah Chaney Patient MRN: [id number] Date of visit: 01/13/24

## 2024-01-25 ENCOUNTER — Other Ambulatory Visit: Payer: Self-pay | Admitting: Orthopedic Surgery

## 2024-02-04 NOTE — Unmapped External Note (Signed)
 Assessment Note   Demographics Verification - Call Monitoring - Confidentiality      Member Verification: Member Verification    Call Monitoring Disclaimer: Call Monitoring Disclaimer         Person Providing Info on the Call   Who is the person providing the information on the call? Member      Limitations/Preferences   What, if any, physical limitations, health literacy, language needs and learning preferences do you have that I should be aware of when we talk?        Specify other language limitations        Specify other physical limitations        LCC Contact Type   Select the Health Your Way contact type: 4th or more contact-Telephonic Lifestyle Coaching           Lifestyle Coaching Weight Management Follow Up                    General Health Perception   How would you describe your health in general? My health is good      SMART GOAL & SMART GOAL Follow Up     SMART GOAL    Did the member set a SMART goal? Yes  Did the member meet their previous SMART goal?  Yes      Brief Summary of Member Status   What medical conditions has your doctor diagnosed you with?    Past Medical History:  Diagnosis Date  . Change in weight   . Hypertension   . Lumbar herniated disc      Did the member score via Care Engine for any condition(s) that he/she did NOT confirm?    HYW:  What medical conditions are you most concerned about and why?    In between MD visits people often find a need to go to the ER or an Urgent Care facility.can you tell me if you had any concerns or issues in which you went to an ER or Urgent care in the last 12 months?      Condition Specific Metrics   Height/Weight/BMI Height: 5' 4/Weight: 224 lb/BMI (Calculated): 38.4   Blood Pressure    Outcome Metrics      Medications   No current outpatient medications on file.    There are no discontinued medications.     Medication Review          Depression Screening PHQ2/PHQ9 Exclusions   PHQ2 EXCLUSION CRITERIA:  Do NOT administer the PHQ-2 if any of the following apply to this member.   PHQ9 EXCLUSION CRITERIA: To determine if this assessment is right for you, I need to ask you some questions before we start.  Besides depression, have you been diagnosed with any other mental health issues such as: No applicable exclusions              Depression PHQ2/PHQ9 Screening Results     PHQ2  The PHQ-2 questions are scored from 0 (not at all) to 3 (nearly every day) and then added together: Score 0-2 = Not likely to be at risk for depression, Score 3-6 = At risk for depression (further screening recommended).        PHQ9 PHQ-9 Mini Module-If diagnosis of depression- show confirmed dx of depression and show PHQ-9 score & score description        Social Drivers of Health   Transportation Needs: No Transportation Needs (11/26/2023)   Received from [facility]  PRAPARE - Transportation   . Lack of Transportation (Medical): No   . Lack of Transportation (Non-Medical): No  Food Insecurity: No Food Insecurity (11/26/2023)   Received from [facility]   Hunger Vital Sign   . Worried About Programme researcher, broadcasting/film/video in the Last Year: Never true   . Ran Out of Food in the Last Year: Never true  Alcohol Use: Not At Risk (07/03/2019)   Received from [facility] visits prior to 08/25/2022.   AUDIT-C   . Frequency of Alcohol Consumption: Never   . Average Number of Drinks: Not asked   . Frequency of Binge Drinking: Never  Tobacco Use: Low Risk  (11/25/2023)   Received from [facility]   Patient History   . Smoking Tobacco Use: Never   . Smokeless Tobacco Use: Never  Utilities: Not At Risk (11/26/2023)   Received from [facility] Utilities   . Threatened with loss of utilities: No      Lab Results   Results     There are no results available from this visit.         Immunizations   Immunizations  Mini Module (show all Q/A pairs answered during this encounter)       Intervention/Actions   Education Topics Discussed      Referrals & Referral Follow Up          MEP/Mobile App Registration & Education on Tools/Resources   Is the member registered on the Member Engagement Platform (MEP) or Mobile App?  Explain tools and resources available on Member Engagement Platform [facility]) and how to get the most benefit out of them.  Indicate resources reviewed with member:   08/08/2023         Next Scheduled Appointment      Date Provider Department Visit Type   03/18/2024 4:00 PM Aloysius Leos Care Management LCC Coach Follow Up Assmt- 1st Attempt         Follow Up Needs Identified   FOLLOW-UP ASSESSMENT    Time zone: ET Member's focus for today: CALL 4 OR MORE / FU / WT MGMT   Conditions: HYPERTENSION / WT MGMT   Terminal/Pregnant: No/No   TOBACCO USE: NEVER   Pre MH: 34 Post MH: 40   Health Index: 63   WT: 224 LBS, HT: 64 in, BMI: 38.4   Rate your health: Good   Present: Mbr did not grocery shop as planned. Mbr has been travelling a lot. Mbr does not feel this goal is good for the current time. Mbr wants to focus on healthy eating while out for now. Mbr had her appointment with her Surgeon on 01/13/24 and she still is not cleared for PT. Mbr will re-evaluate again in 6 weeks. Mbrs blood sugar was pre-diabetic during her recent labs. Mbr does not want to take more medication. Mbr will be able to get back on track on 03/15/24. Will check in with mbr after.    Motivation: (8 of 10) Mbr wants to prevent taking metformin.      Intervention/Education: Went over strategies for eating healthy while eating out.    Goals: Long-Term- Within 1-year (by 09/30/24) mbr would like to lose 50 lbs to get down to 183 lbs.    Medium-Term-  (Completed 12/31/23) Within 3 months (by 12/31/23) mbr would like to lose 8 lbs and get down to 225 lbs.  Within 3 months (by 04/08/24) mbr  would like to lose 8 lbs  and get down to 217 lbs.   Behavior-  Starting 02/04/24, mbr will eat healthy while on the go by eating half portions at restaurants and choosing low sodium options and will do this until the next coaching appointment.    Barriers: None   Confidence: (8 of 10)   MAH/HRA/Incentive/Referrals: No new referrals.    Plan for Next Call/Follow-Up: Check-in on nutrition goals.

## 2024-02-23 ENCOUNTER — Other Ambulatory Visit: Payer: Self-pay | Admitting: Orthopedic Surgery

## 2024-02-26 ENCOUNTER — Ambulatory Visit: Admitting: Orthopedic Surgery

## 2024-02-27 ENCOUNTER — Ambulatory Visit (INDEPENDENT_AMBULATORY_CARE_PROVIDER_SITE_OTHER): Admitting: Orthopedic Surgery

## 2024-02-27 ENCOUNTER — Other Ambulatory Visit (INDEPENDENT_AMBULATORY_CARE_PROVIDER_SITE_OTHER): Payer: Self-pay

## 2024-02-27 DIAGNOSIS — Z9889 Other specified postprocedural states: Secondary | ICD-10-CM | POA: Diagnosis not present

## 2024-02-27 MED ORDER — HYDROCODONE-ACETAMINOPHEN 5-325 MG PO TABS: 1.0000 | ORAL_TABLET | Freq: Four times a day (QID) | ORAL | 0 refills | Status: AC | PRN

## 2024-02-27 NOTE — Progress Notes (Signed)
 Orthopedic Surgery Post-operative Office Visit   Procedure: L5/S1 left-sided hemilaminotomy and microdiscectomy Date of Surgery: 11/25/2023 (~3 months post-op)   Assessment: Patient is a 58 y.o. who has noticed resolution of her radiating leg pain. No longer having any significant left lateral hip pain in the area of the bursa. Does have periodic right-sided low back pain     Plan: -Operative plans complete -No spine specific precautions, activity as tolerated -Says she is doing better with her left lateral hip pain after starting the exercises for the trochanteric bursitis, I recommended that she continue to do those -Prescribed hydrocodone  to be used sparingly for her periodic right sided low back pain -Can use a heating pad in the morning since that seems to be the worst time for her back pain and do some light stretching before starting her day -Return to office in 3 months, x-rays needed at next visit: AP/lateral/flex/ex lumbar   ___________________________________________________________________________     Subjective: Patient has not noticed any return of her radiating leg pain since surgery.  She did have some left lateral hip pain during our last visit.  I provided her with some home exercises to work on for trochanteric bursitis.  She has been doing those and has noticed significant improvement in her hip pain.  She does notice some pain in her right lower lumbar region.  She notices it particularly in the morning when getting up.  She will sometimes notice it to when she is more active.  No radiating leg right leg pain.  She has been using hydrocodone  sparingly for this pain when it is severe.  She does not have to use it frequently.  No bowel or bladder incontinence.  No saddle anesthesia.   Objective:   General: no acute distress, appropriate affect Neurologic: alert, answering questions appropriately, following commands Respiratory: unlabored breathing on room air Skin:  incision is well healed   MSK (spine):   -Strength exam                                                   Left                  Right   EHL                              5/5                  5/5 TA                                 5/5                  5/5 GSC                             5/5                  5/5 Knee extension            5/5                  5/5 Hip flexion  5/5                  5/5   -Sensory exam                           Sensation intact to light touch in L2-S1 nerve distributions of bilateral lower extremities   Imaging: XRs of the lumbar spine from 02/27/2024 were independently reviewed and interpreted, showing disc height loss at L5/S1.  No other significant degenerative changes.  No fracture or dislocation seen.  No evidence of instability on flexion/extension views.     Patient name: Deborah Chaney Patient MRN: [id number] Date of visit: 02/27/24

## 2024-03-23 ENCOUNTER — Ambulatory Visit: Admitting: Orthopedic Surgery

## 2024-03-26 ENCOUNTER — Other Ambulatory Visit: Payer: Self-pay | Admitting: Orthopedic Surgery

## 2024-04-27 ENCOUNTER — Encounter: Payer: Self-pay | Admitting: Radiology

## 2024-04-29 ENCOUNTER — Other Ambulatory Visit: Payer: Self-pay | Admitting: Orthopedic Surgery

## 2024-05-28 ENCOUNTER — Other Ambulatory Visit: Payer: Self-pay

## 2024-05-28 ENCOUNTER — Ambulatory Visit: Admitting: Orthopedic Surgery

## 2024-05-28 DIAGNOSIS — Z9889 Other specified postprocedural states: Secondary | ICD-10-CM

## 2024-05-28 NOTE — Progress Notes (Signed)
 Orthopedic Surgery Post-operative Office Visit   Procedure: L5/S1 left-sided hemilaminotomy and microdiscectomy Date of Surgery: 11/25/2023 (~6 months post-op)   Assessment: Patient is a 58 y.o. who doing well after surgery.  No return of radicular pain.  Has some occasional back pain on the right side of her lower lumbar region that is controlled with Tylenol      Plan: -Operative plans complete -No spine specific precautions, activity as tolerated -Pain management: Tylenol  as needed -Return to office in 6 months, x-rays needed at next visit: AP/lateral/flex/ex lumbar   ___________________________________________________________________________     Subjective: Patient has been doing well since she was last in the office.  She has not had any radiating leg pain.  She still has some decrease sensation along the posterior aspect of the leg and plantar aspect of the left foot.  She still notices some pain in the right side of the lower back particularly when changing positions in bed.  She has been using Vicks to control the pain.  The main agreement is acetaminophen .  She said she uses that about 3 times per week.  She has returned to full activities and recently passed her on arms self-defense class.  She is pleased with how she is doing so far.   Objective:   General: no acute distress, appropriate affect Neurologic: alert, answering questions appropriately, following commands Respiratory: unlabored breathing on room air Skin: incision is well healed   MSK (spine):   -Strength exam                                                   Left                  Right   EHL                              5/5                  5/5 TA                                 5/5                  5/5 GSC                             5/5                  5/5 Knee extension            5/5                  5/5 Hip flexion                    5/5                  5/5   -Sensory exam                            Sensation intact to light touch in L2-S1 nerve distributions of bilateral lower extremities   Imaging: XRs of the lumbar spine from 05/28/2024 were independently  reviewed and interpreted, showing disc height loss at L5/S1.  No other significant degenerative changes seen.  No evidence of instability on flexion/extension views.  No fracture or dislocation seen.     Patient name: Deborah Chaney Patient MRN: [id number] Date of visit: 05/28/24

## 2024-11-25 ENCOUNTER — Ambulatory Visit: Admitting: Orthopedic Surgery
